# Patient Record
Sex: Female | Born: 1980 | Race: White | Hispanic: No | Marital: Single | Smoking: Never smoker
Health system: Southern US, Community
[De-identification: ages and names within clinical notes are randomized; demographics above are authoritative.]

## PROBLEM LIST (undated history)

## (undated) DIAGNOSIS — M8580 Other specified disorders of bone density and structure, unspecified site: Secondary | ICD-10-CM

## (undated) DIAGNOSIS — F419 Anxiety disorder, unspecified: Secondary | ICD-10-CM

## (undated) DIAGNOSIS — G629 Polyneuropathy, unspecified: Secondary | ICD-10-CM

## (undated) DIAGNOSIS — C50919 Malignant neoplasm of unspecified site of unspecified female breast: Secondary | ICD-10-CM

## (undated) HISTORY — DX: Malignant neoplasm of unspecified site of unspecified female breast: C50.919

## (undated) HISTORY — PX: ABDOMINAL HYSTERECTOMY: SHX81

## (undated) HISTORY — PX: MASTECTOMY: SHX3

## (undated) HISTORY — PX: BREAST SURGERY: SHX581

---

## 2002-11-02 ENCOUNTER — Encounter: Payer: Self-pay | Admitting: Family Medicine

## 2002-11-02 ENCOUNTER — Ambulatory Visit (HOSPITAL_COMMUNITY): Admission: RE | Admit: 2002-11-02 | Discharge: 2002-11-02 | Payer: Self-pay | Admitting: Family Medicine

## 2002-11-03 ENCOUNTER — Ambulatory Visit (HOSPITAL_COMMUNITY): Admission: RE | Admit: 2002-11-03 | Discharge: 2002-11-03 | Payer: Self-pay | Admitting: Family Medicine

## 2002-11-03 ENCOUNTER — Encounter: Payer: Self-pay | Admitting: Family Medicine

## 2004-11-09 ENCOUNTER — Encounter: Admission: RE | Admit: 2004-11-09 | Discharge: 2004-11-09 | Payer: Self-pay | Admitting: Hematology and Oncology

## 2006-11-08 ENCOUNTER — Encounter: Admission: RE | Admit: 2006-11-08 | Discharge: 2006-11-08 | Payer: Self-pay | Admitting: Unknown Physician Specialty

## 2006-12-06 ENCOUNTER — Encounter: Admission: RE | Admit: 2006-12-06 | Discharge: 2006-12-06 | Payer: Self-pay | Admitting: General Surgery

## 2007-07-16 ENCOUNTER — Ambulatory Visit (HOSPITAL_COMMUNITY): Admission: RE | Admit: 2007-07-16 | Discharge: 2007-07-16 | Payer: Self-pay | Admitting: General Surgery

## 2007-11-26 ENCOUNTER — Encounter: Admission: RE | Admit: 2007-11-26 | Discharge: 2007-11-26 | Payer: Self-pay | Admitting: Unknown Physician Specialty

## 2008-01-16 ENCOUNTER — Encounter: Admission: RE | Admit: 2008-01-16 | Discharge: 2008-01-16 | Payer: Self-pay | Admitting: Unknown Physician Specialty

## 2009-01-28 ENCOUNTER — Encounter: Admission: RE | Admit: 2009-01-28 | Discharge: 2009-01-28 | Payer: Self-pay | Admitting: Unknown Physician Specialty

## 2009-01-28 ENCOUNTER — Encounter: Admission: RE | Admit: 2009-01-28 | Discharge: 2009-01-28 | Payer: Self-pay | Admitting: General Surgery

## 2010-07-16 ENCOUNTER — Encounter: Payer: Self-pay | Admitting: Unknown Physician Specialty

## 2011-06-26 DIAGNOSIS — C50919 Malignant neoplasm of unspecified site of unspecified female breast: Secondary | ICD-10-CM

## 2011-06-26 HISTORY — DX: Malignant neoplasm of unspecified site of unspecified female breast: C50.919

## 2011-09-18 ENCOUNTER — Other Ambulatory Visit: Payer: Self-pay | Admitting: Internal Medicine

## 2011-09-18 DIAGNOSIS — R921 Mammographic calcification found on diagnostic imaging of breast: Secondary | ICD-10-CM

## 2011-09-21 ENCOUNTER — Ambulatory Visit
Admission: RE | Admit: 2011-09-21 | Discharge: 2011-09-21 | Disposition: A | Discharge status: Home / Self Care | Payer: PRIVATE HEALTH INSURANCE | Source: Ambulatory Visit | Attending: Internal Medicine | Admitting: Internal Medicine

## 2011-09-21 ENCOUNTER — Other Ambulatory Visit: Payer: Self-pay | Admitting: Internal Medicine

## 2011-09-21 DIAGNOSIS — R921 Mammographic calcification found on diagnostic imaging of breast: Secondary | ICD-10-CM

## 2011-11-20 ENCOUNTER — Encounter: Payer: PRIVATE HEALTH INSURANCE | Admitting: Hematology and Oncology

## 2011-11-20 DIAGNOSIS — D0590 Unspecified type of carcinoma in situ of unspecified breast: Secondary | ICD-10-CM

## 2011-11-20 DIAGNOSIS — Z1501 Genetic susceptibility to malignant neoplasm of breast: Secondary | ICD-10-CM

## 2011-11-20 DIAGNOSIS — I824Z9 Acute embolism and thrombosis of unspecified deep veins of unspecified distal lower extremity: Secondary | ICD-10-CM

## 2012-03-10 ENCOUNTER — Telehealth: Payer: Self-pay | Admitting: *Deleted

## 2012-03-10 NOTE — Telephone Encounter (Signed)
Confirmed 05/05/12 genetic appt w/ pt.

## 2012-05-05 ENCOUNTER — Encounter: Payer: Self-pay | Admitting: Genetic Counselor

## 2012-05-05 ENCOUNTER — Ambulatory Visit (HOSPITAL_BASED_OUTPATIENT_CLINIC_OR_DEPARTMENT_OTHER): Payer: PRIVATE HEALTH INSURANCE | Admitting: Genetic Counselor

## 2012-05-05 ENCOUNTER — Other Ambulatory Visit: Payer: PRIVATE HEALTH INSURANCE | Admitting: Lab

## 2012-05-05 DIAGNOSIS — C50919 Malignant neoplasm of unspecified site of unspecified female breast: Secondary | ICD-10-CM

## 2012-05-05 DIAGNOSIS — IMO0002 Reserved for concepts with insufficient information to code with codable children: Secondary | ICD-10-CM

## 2012-05-05 DIAGNOSIS — Z1501 Genetic susceptibility to malignant neoplasm of breast: Secondary | ICD-10-CM

## 2012-05-05 DIAGNOSIS — Z1509 Genetic susceptibility to other malignant neoplasm: Secondary | ICD-10-CM

## 2012-05-05 NOTE — Progress Notes (Signed)
Dr.  Isabel Caprice requested a consultation for genetic counseling and risk assessment for Carly [name], a 31 y.o. female, for discussion of her personal history of breast cancer and BRCA1 mutation. She presents to clinic today with her daughter Carly [name] to discuss the possibility of a genetic predisposition to cancer, and to further clarify her risks, as well as her family members' risks for cancer.   HISTORY OF PRESENT ILLNESS: In 2013, at the age of 67, Carly [name] was diagnosed with breast cancer. This was treated with double mastectomy and reconstruction.    Past Medical History  Diagnosis Date  . Breast cancer 2013    BRCA1 positive    History reviewed. No pertinent past surgical history.  History  Substance Use Topics  . Smoking status: Never Smoker   . Smokeless tobacco: Not on file  . Alcohol Use: No    REPRODUCTIVE HISTORY AND PERSONAL RISK ASSESSMENT FACTORS: Menarche was at age 13.   Premenopausal Uterus Intact: Yes, but is planning a TAH-BSO in May 2014. Ovaries Intact: Yes, but is planning a TAH-BSO in May 2014. G1P1A0 , first live birth at age 51  She has not previously undergone treatment for infertility.   OCP use for 2 years and depoprovera for 14 years   She has not used HRT in the past.    FAMILY HISTORY:  We obtained a detailed, 4-generation family history.  Significant diagnoses are listed below: Family History  Problem Relation Age of Onset  . Breast cancer Mother 62  . BRCA 1/2 Mother   . Prostate cancer Father     metastatic  . Breast cancer Sister 54    BRCA1 mutation  . Breast cancer Maternal Aunt     dx in her 53s; BRCA1 mutation  . Heart attack Maternal Grandfather   . Ovarian cancer Paternal Grandmother     dx in her 62s  . Heart attack Paternal Grandfather   . Healthy Sister   . BRCA 1/2 Maternal Aunt   . Healthy Maternal Aunt     negative for a BRCA mutation  . Healthy Maternal Aunt     negative for a BRCA mutation  The  patient was diagnosed with a BRCA1 mutation through [facility]. She was diagnosed with breast cancer in 2013 at age 19. She has two full sisters and a paternal half brother. One sister has tested positive for a BRCA mutation and was diagnosed with breast cancer in 2001 at at the age of 76. The other sister did not have the mutation. The patient's mother also has the BRCA1 mutation and was diagnosed with breast cancer at age 63. The patient's mother has four sisters. One sister had breast cancer and a BRCA mutation, and a second sister tested positive for a BRCA mutation, but has not developed cancer. The remaining two sisters tested negative for the BRCA mutation. The patient's maternal grandmother is 63 and has not had cancer, and her grandfather died of a heart attack at age 26. The patient's father died of metastatic prostate cancer at age 91. He was an only child. His mother died of ovarian cancer in her 82s, and his father died of a heart attack at 19. There is no other report of cancer on either side of the family.   Patient's maternal ancestors are of unknown descent, and paternal ancestors are of unknown descent. There is no reported Ashkenazi Jewish ancestry. There is no known consanguinity.   GENETIC COUNSELING RISK ASSESSMENT,  DISCUSSION, AND SUGGESTED FOLLOW UP:  We reviewed the natural history and genetic etiology of sporadic, familial and hereditary cancer syndromes. About 5-10% of breast cancer is hereditary. Of this, about 85% is the result of a BRCA1 or BRCA2 mutation. We reviewed the red flags of hereditary cancer syndromes and the dominant inheritance patterns. We reviewed that the family has a known BRCA1 mutation, and therefore her daughter is at 50% risk for testing positive for this mutation. We reviewed the NCCN guidelines for screening women with BRCA mutations for breast cancer including annual mastectomy, possible MRIs, and twice yearly clinical breast exams. We discussed risk  reducing mastectomy and oophorectomy. Currently, the patient's daughter is 82. Therefore, we would defer her daughter's testing until she was 18. We reviewed the AAP and ACMG guidelines for testing minors for adult onset conditions. At this time, we would not change the clinical management of the patient's daughter. However, if her daughter feels strongly about testing we could refer her for counseling, and once approved by a counselor, proceed with testing.   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 If a mutation indicating HBOCS is detected in this case, the [employer] recommendations would include increased cancer surveillance and possible prophylactic surgery. If a mutation is detected, the patient will be referred back to the referring provider and to any additional appropriate care providers to discuss the relevant options.   If a mutation is not found in the patient's daughter, this will decrease the likelihood of the development of breast cancer in her children to that of the population risk for their age and gender. Cancer surveillance options would be discussed for the patient according to the appropriate standard National Comprehensive Cancer Network and American Cancer Society guidelines, with consideration of their personal and family history risk factors. In this case, the patient's children will be referred back to their care providers for discussions of management.   After considering the risks, benefits, and limitations, the patient indicated coming back at a later date if her daughter decides to proceed with testing. At this time, the patient's daughter does  not want to be tested and stated that she did not want to do so because she could not do anything to change her care at this time.  The patient was seen for a total of 60 minutes, greater than 50% of which was spent face-to-face counseling.  This plan is being carried out per Dr. Isabel Caprice recommendations.  This note will also be sent to the referring provider via the electronic medical record. The patient will be supplied with a summary of this genetic counseling discussion as well as educational information on the discussed hereditary cancer syndromes following the conclusion of their visit.   Patient was discussed with Dr. Drue Second.   _______________________________________________________________________ For Office Staff:  Number of people involved in session: 8 Was an Intern/ student involved with case: yes

## 2012-05-06 DIAGNOSIS — G43909 Migraine, unspecified, not intractable, without status migrainosus: Secondary | ICD-10-CM | POA: Insufficient documentation

## 2012-05-06 DIAGNOSIS — Z1509 Genetic susceptibility to other malignant neoplasm: Secondary | ICD-10-CM | POA: Insufficient documentation

## 2012-05-07 ENCOUNTER — Encounter: Payer: Self-pay | Admitting: Genetic Counselor

## 2012-09-29 DIAGNOSIS — L905 Scar conditions and fibrosis of skin: Secondary | ICD-10-CM | POA: Insufficient documentation

## 2015-07-12 DIAGNOSIS — C50919 Malignant neoplasm of unspecified site of unspecified female breast: Secondary | ICD-10-CM | POA: Insufficient documentation

## 2015-08-10 DIAGNOSIS — R161 Splenomegaly, not elsewhere classified: Secondary | ICD-10-CM | POA: Insufficient documentation

## 2015-08-26 DIAGNOSIS — Z95828 Presence of other vascular implants and grafts: Secondary | ICD-10-CM | POA: Insufficient documentation

## 2015-09-13 DIAGNOSIS — M79601 Pain in right arm: Secondary | ICD-10-CM | POA: Insufficient documentation

## 2015-10-25 DIAGNOSIS — G620 Drug-induced polyneuropathy: Secondary | ICD-10-CM | POA: Insufficient documentation

## 2015-10-25 DIAGNOSIS — T451X5A Adverse effect of antineoplastic and immunosuppressive drugs, initial encounter: Secondary | ICD-10-CM

## 2015-10-25 DIAGNOSIS — D701 Agranulocytosis secondary to cancer chemotherapy: Secondary | ICD-10-CM | POA: Insufficient documentation

## 2015-12-12 DIAGNOSIS — Z90722 Acquired absence of ovaries, bilateral: Secondary | ICD-10-CM | POA: Insufficient documentation

## 2015-12-12 DIAGNOSIS — IMO0002 Reserved for concepts with insufficient information to code with codable children: Secondary | ICD-10-CM | POA: Insufficient documentation

## 2016-01-26 ENCOUNTER — Encounter (HOSPITAL_COMMUNITY): Payer: Medicaid Other | Attending: Hematology & Oncology | Admitting: Hematology & Oncology

## 2016-01-26 ENCOUNTER — Encounter (HOSPITAL_COMMUNITY): Payer: Self-pay | Admitting: Hematology & Oncology

## 2016-01-26 ENCOUNTER — Encounter (HOSPITAL_COMMUNITY): Payer: Medicaid Other

## 2016-01-26 VITALS — BP 140/82 | HR 86 | Temp 98.1°F | Resp 16 | Ht 67.0 in | Wt 281.2 lb

## 2016-01-26 DIAGNOSIS — G620 Drug-induced polyneuropathy: Secondary | ICD-10-CM | POA: Diagnosis not present

## 2016-01-26 DIAGNOSIS — D0511 Intraductal carcinoma in situ of right breast: Secondary | ICD-10-CM

## 2016-01-26 DIAGNOSIS — Z780 Asymptomatic menopausal state: Secondary | ICD-10-CM

## 2016-01-26 DIAGNOSIS — Z1509 Genetic susceptibility to other malignant neoplasm: Secondary | ICD-10-CM

## 2016-01-26 DIAGNOSIS — Z803 Family history of malignant neoplasm of breast: Secondary | ICD-10-CM

## 2016-01-26 DIAGNOSIS — I890 Lymphedema, not elsewhere classified: Secondary | ICD-10-CM

## 2016-01-26 DIAGNOSIS — C50911 Malignant neoplasm of unspecified site of right female breast: Secondary | ICD-10-CM

## 2016-01-26 DIAGNOSIS — Z8041 Family history of malignant neoplasm of ovary: Secondary | ICD-10-CM | POA: Diagnosis not present

## 2016-01-26 DIAGNOSIS — Z1501 Genetic susceptibility to malignant neoplasm of breast: Secondary | ICD-10-CM

## 2016-01-26 DIAGNOSIS — Z86718 Personal history of other venous thrombosis and embolism: Secondary | ICD-10-CM

## 2016-01-26 DIAGNOSIS — T451X5A Adverse effect of antineoplastic and immunosuppressive drugs, initial encounter: Secondary | ICD-10-CM

## 2016-01-26 MED ORDER — GABAPENTIN 300 MG PO CAPS: 600.0000 mg | ORAL_CAPSULE | Freq: Three times a day (TID) | ORAL | 3 refills | Status: DC

## 2016-01-26 MED ORDER — HYDROCODONE-ACETAMINOPHEN 10-325 MG PO TABS: 1.0000 | ORAL_TABLET | ORAL | 0 refills | Status: DC | PRN

## 2016-01-26 NOTE — Patient Instructions (Signed)
[place] at [facility] Discharge Instructions  RECOMMENDATIONS MADE BY THE CONSULTANT AND ANY TEST RESULTS WILL BE SENT TO YOUR REFERRING PHYSICIAN.  Exam done and seen today by Dr. Gustavus Bryant today and in 6 weeks. Return to see the doctor in 6 weeks Pain script given today. Please call the clinic if you have any questions or concerns  Thank you for choosing [place] at [facility] to provide your oncology and hematology care.  To afford each patient quality time with our provider, please arrive at least 15 minutes before your scheduled appointment time.   Beginning January 23rd 2017 lab work for the [facility] will be done in the  Main lab at [facility] on 1st floor. If you have a lab appointment with the [place] please come in thru the  Main Entrance and check in at the main information desk  You need to re-schedule your appointment should you arrive 10 or more minutes late.  We strive to give you quality time with our providers, and arriving late affects you and other patients whose appointments are after yours.  Also, if you no show three or more times for appointments you may be dismissed from the clinic at the providers discretion.     Again, thank you for choosing [facility]  Our hope is that these requests will decrease the amount of time that you wait before being seen by our physicians.       _____________________________________________________________  Should you have questions after your visit to [facility], please contact our office at [phone] between the hours of 8:30 a.m. and 4:30 p.m.  Voicemails left after 4:30 p.m. will not be returned until the following business day.  For prescription refill requests, have your pharmacy contact our office.         Resources For Cancer Patients and their Caregivers ? American Cancer Society: Can assist with transportation, wigs, general  needs, runs Look Good Feel Better.        508-189-1591 ? Cancer Care: Provides financial assistance, online support groups, medication/co-pay assistance.  1-800-813-HOPE 320-240-9830) ? [place] Assists [place] cancer patients and their families through emotional , educational and financial support.  305-656-1886 ? [place] DSS Where to apply for food stamps, Medicaid and utility assistance. 727-604-8451 ? [facility]: Transportation to medical appointments. 743-631-7583 ? Social Security Administration: May apply for disability if have a Stage IV cancer. 9192934861 (269)307-7541 ? [employer], Disability and Transit Services: Assists with nutrition, care and transit needs. [place] Support Programs: @10RELATIVEDAYS @ > Cancer Support Group  2nd Tuesday of the month 1pm-2pm, Journey Room  > Creative Journey  3rd Tuesday of the month 1130am-1pm, Journey Room  > Look Good Feel Better  1st Wednesday of the month 10am-12 noon, Journey Room (Call [place] to register [phone])

## 2016-01-26 NOTE — Progress Notes (Signed)
[place]  CONSULT NOTE  No care team member to display  CHIEF COMPLAINTS/PURPOSE OF CONSULTATION:     Recurrent breast cancer ([place])   05/06/2015 Initial Biopsy    Incisional biopsy with Dr. Maryclare Labrador of palpable mass, lower outer quadrant of reconstructed R breast close to nipple ER+ 97%, PR+ 70 (weak) HER 2 - by [facility]      07/06/2015 Surgery    Definitive lumpectomy and LN dissection performed revealing a 3.1 cm primary 1/14 positive LN with a macro metastases, satellite nodules adjacent to the primary, LVI-, however she had skin invasion along with satellite nodules, T4b N1a, Stage IIIB, grade IIII,      07/07/2015 Imaging    Per records negative CT C/A/P and bone scan      08/15/2015 - 11/22/2015 Chemotherapy    AC x 4, Taxol X 4 both given dose dense                  HISTORY OF PRESENTING ILLNESS:  Carly [name] 35 y.o. 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 She noted a palpable abnormality in the reconstructed breast which was biopsied an revealed recurrence. ER/PR+ and HER 2 -. 1/14 LN positive for disease. Per report there were satellite nodules adjacent to the primary at the time of surgical excision. She notes that she had her implant removed and replaced.   Carly [name] is unaccompanied.  2013 double mastectomy without nipple sparing with Dr. Lizbeth Bark? in [place]. Dr. Maryclare Labrador began reconstruction in [place]. April 2014 she had implants put in. DCIS Stage I no lymph nodes positive. October 2016 she found a knot in the breast scar. Her PCP felt it and got an [place] to make sure it was okay. She returned the next week for a biopsy however the radiologist did not want to biopsy because it was so close to the implant. She had it biopsied with Dr. Lizbeth Bark. She was found to have invasive adenocarcinoma Stage III with  positive LN. She had the implant removed and a new one put in. She began chemotherapy in February and finished in May. She began radiation in June and is scheduled to finish radiation treatment tomorrow.   She was told by Dr. Tressie Stalker that she would be taking Tamoxifen for 10 years. She was given a prescription for Tamoxifen with several refills. She did not start taking this because she wanted to wait to be seen at [facility] first.   Her ovaries and tubes were removed in November 2016. She still has her uterus. She was found to be BRCA+ when she was 35 yo. She has not received genetics testing since 2001.  She has been doing well with all of this. She is happy to be done with chemotherapy and nearly being done with radiation treatment. She has been using Aquafor, clear aloe, and sensitive Dove on her radiated breast. States, "The fold is the worst".   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 In the mornings her feet hurt so bad that she doesn't want to get out of the bed to stand on them. This improves through the day after taking neurontin and Vicodin. When she began experiencing the neuropathy she went into the clinic crying and wanted her feet cut off, it hurt so badly.  Her neurontin was increased to 600 daily. She is okay on this dosage along with the Vicodin.   She reports having a large water blister. She stands up very slowly from her chair to avoid discomfort in her feet. States, "Tennis shoes won't happen". She wears flip flops and notes she even has to wear them to the bathroom at night because she can't stand not having something on the bottom of her feet.   She does note trouble sleeping and will sometimes take her medication in the night to be able to sleep. After radiation treatment, she often takes a nap.  She takes Protonix though not everyday for intermittent acid reflux. The acid reflux happens depending  on what she eats.  She reports fatigue. "I just stay tired".   She had to try many types of anti-nausea medication because her nausea was terrible. She was to the point that she'd rather just throw up and get it over with. She finally found an anti-nausea that worked around her last round of treatment.   She had a 2-D Echo done before she started chemotherapy. She was told she would have another performed halfway through treatment but it never got ordered. She is concerned whether she may have clots. She notes having "webbing" in her right arm as well as lymphedema. She sees a lymphedema specialist at [facility] who will be ordering her a lymphedema machine. She notes her lymphedema is better some days than others. She knows all the exercises to do at home, and is very aware of these having seen her mother and sister doing these. She understands how important it is to keep an eye on lymphedema symptoms.   She had a CT, bone scan, and MRIs done at [facility].  States she has not had a period in 18 years.  She is interested in returning to work in the future. She was terminated from her job without being notified and no longer has health insurance benefits.   The patient is here to establish and continue care of breast cancer.   MEDICAL HISTORY:  Past Medical History:  Diagnosis Date  . Breast cancer ([place]) 2013   BRCA1 positive    SURGICAL HISTORY: Past Surgical History:  Procedure Laterality Date  . ABDOMINAL HYSTERECTOMY    . CESAREAN SECTION    . MASTECTOMY      SOCIAL HISTORY: Social History   Social History  . Marital status: Single    Spouse name: N/A  . Number of children: N/A  . Years of education: N/A   Occupational History  . Not on file.   Social History Main Topics  . Smoking status: Never Smoker  . Smokeless tobacco: Never Used  . Alcohol use No  . Drug use: No  . Sexual activity: Not on file   Other Topics Concern  . Not on file   Social History  Narrative  . 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 Breast cancer Mother 5  . BRCA 1/2 Mother   . Prostate cancer Father     metastatic  . Breast cancer Sister 8    BRCA1 mutation  . Breast cancer Maternal Aunt     dx in her 7s; BRCA1 mutation  . Heart attack Maternal Grandfather   .  Ovarian cancer Paternal Grandmother     dx in her 36s  . Heart attack Paternal Grandfather   . Healthy Sister   . BRCA 1/2 Maternal Aunt   . Healthy Maternal Aunt     negative for a BRCA mutation  . Healthy Maternal Aunt     negative for a BRCA mutation   Mother still living with breast cancer BRCA+ diagnosed at 77 yo Father deceased in 09-17-09 at 30 yo with prostate cancer mets to the bone. Her father was an only child and his father was adopted. 2 sisters and paternal brother 1 sister diagnosed with breast cancer at 24 yo, had a double mastectomy and been in remission for 17 years  ALLERGIES:  is allergic to oxycodone-acetaminophen.  MEDICATIONS:  Current Outpatient Prescriptions  Medication Sig Dispense Refill  . ALPRAZolam (XANAX) 0.25 MG tablet Take one tablet three times a day as needed.    . gabapentin (NEURONTIN) 300 MG capsule Take by mouth.    Marland Kitchen HYDROcodone-acetaminophen (NORCO) 10-325 MG tablet Take by mouth.    . pantoprazole (PROTONIX) 40 MG tablet Take by mouth.    . cyclobenzaprine (FLEXERIL) 5 MG tablet Take by mouth.    . docusate sodium (COLACE) 100 MG capsule Take by mouth.    Marland Kitchen ibuprofen (ADVIL,MOTRIN) 200 MG tablet Take by mouth.    . lidocaine-prilocaine (EMLA) cream Apply topically.    . ondansetron (ZOFRAN) 4 MG tablet TAKE ONE TABLET BY MOUTH EVERY 6 HOURS AS NEEDED FOR NAUSEA    . prochlorperazine (COMPAZINE) 10 MG tablet Take by mouth.    . promethazine  (PHENERGAN) 12.5 MG tablet Take by mouth.    . tamoxifen (NOLVADEX) 20 MG tablet Take 20 mg by mouth daily.  2  . triamterene-hydrochlorothiazide (MAXZIDE-25) 37.5-25 MG tablet Take one tab daily as needed for swelling.     No current facility-administered medications for this visit.     Review of Systems  Constitutional: Positive for malaise/fatigue.  HENT: Negative.   Eyes: Negative.   Respiratory: Negative.   Cardiovascular: Negative.   Gastrointestinal: Positive for heartburn.       Intermittent acid reflux managed with Protonix  Genitourinary: Negative.   Musculoskeletal: Negative.   Skin: Negative.        Radiation related skin changes to breast  Neurological: Positive for sensory change and weakness.  Endo/Heme/Allergies: Negative.   Psychiatric/Behavioral: Negative.   All other systems reviewed and are negative. 14 point ROS was done and is otherwise as detailed above or in HPI   PHYSICAL EXAMINATION: ECOG PERFORMANCE STATUS: 1 - Symptomatic but completely ambulatory  Vitals:   01/26/16 1147  BP: 140/82  Pulse: 86  Resp: 16  Temp: 98.1 F (36.7 C)   Filed Weights   01/26/16 1147  Weight: 281 lb 3.2 oz (127.6 kg)    Physical Exam  Constitutional: She is oriented to person, place, and time and well-developed, well-nourished, and in no distress.  HENT:  Head: Normocephalic and atraumatic.  Nose: Nose normal.  Mouth/Throat: Oropharynx is clear and moist. No oropharyngeal exudate.  Eyes: Conjunctivae and EOM are normal. Pupils are equal, round, and reactive to light. Right eye exhibits no discharge. Left eye exhibits no discharge. No scleral icterus.  Neck: Normal range of motion. Neck supple. No tracheal deviation present. No thyromegaly present.  Cardiovascular: Normal rate, regular rhythm and normal heart sounds.  Exam reveals no gallop and no friction rub.   No murmur heard. Pulmonary/Chest: Effort normal and breath sounds  normal. She has no wheezes. She has  no rales.    Abdominal: Soft. Bowel sounds are normal. She exhibits no distension and no mass. There is no tenderness. There is no rebound and no guarding.  Musculoskeletal: Normal range of motion. She exhibits edema.  Right arm lymphedema present.   Lymphadenopathy:    She has no cervical adenopathy.  Neurological: She is alert and oriented to person, place, and time. She has normal reflexes. No cranial nerve deficit. Gait normal. Coordination normal.  Skin: Skin is warm and dry. No rash noted.  Psychiatric: Mood, memory, affect and judgment normal.  Nursing note and vitals reviewed.   LABORATORY DATA:  I have reviewed the data as listed No results found for: WBC, HGB, HCT, MCV, PLT CMP  No results found for: NA, K, CL, CO2, GLUCOSE, BUN, CREATININE, CALCIUM, PROT, ALBUMIN, AST, ALT, ALKPHOS, BILITOT, GFRNONAA, GFRAA   RADIOGRAPHIC STUDIES: I have personally reviewed the radiological images as listed and agreed with the findings in the report. No results found.  ASSESSMENT & PLAN: Stage I DCIS BRCA+ diagnosed in 2013; double mastectomy in [place], reconstruction with Dr. Maryclare Labrador Stage III Invasive adenocarcinoma diagnosed in 2016; chemotherapy with Dr. Tressie Stalker and radiation with Dr. Pablo Ledger ER+ PR+ HER 2 - Last genetics testing done in 2001 Bilateral mastectomy Hysterectomy/oophorectomy HX DVT BRCA1 positivity Chemotherapy with AC followed by T given in a dose dense fashion Adjuvant XRT Chemotherapy induced neuropathy  Last seen by Dr. Tressie Stalker on 12/12/15.  The patient is here to establish and continue care of breast cancer. She was given a NCCN guidelines information booklet about breast cancer. I reviewed this booklet with the patient at length. I spoke with the patient about aromatase inhibitors and long term follow up. She is an appropriate candidate for AI therapy and I would recommend this over tamoxifen for her.   Hormone levels will be checked after our visit  today. She had an oophorectomy years back.   I spoke with genetics and based on the patient's history it is unlikely that insurance will cover additional testing. I spoke with the patient about having genetics testing performed out of pocket for about $250. The patient will let us know if she would like a future meeting with genetics. It is highly unlikely that she would have more than one mutation.   She is scheduled to complete radiation treatment tomorrow, 01/27/16.  I have ordered a 2D Echo at the patient's request. I have set her up for a DEXA as she has been post menopausal for some time.   I will make a referral to survivorship.  The patient will sign a release of records for scans and operative reports, pathology performed at [facility], including CT imaging, MRI imaging.  She will return in a few weeks to review DEXA, echo and to start appropriate endocrine therapy.   Orders Placed This Encounter  Procedures  . DG Bone Density    JH/AMY       Standing Status:   Future    Number of Occurrences:   1    Standing Expiration Date:   01/25/2017    Order Specific Question:   Reason for Exam (SYMPTOM  OR DIAGNOSIS REQUIRED)    Answer:   high risk medication    Order Specific Question:   Is the patient pregnant?    Answer:   No    Order Specific Question:   Preferred imaging location?    Answer:   [facility]  .  Follicle stimulating hormone    Standing Status:   Future    Number of Occurrences:   1    Standing Expiration Date:   01/25/2017  . Estradiol    Standing Status:   Future    Number of Occurrences:   1    Standing Expiration Date:   01/25/2017  . CBC with Differential    Standing Status:   Future    Standing Expiration Date:   01/25/2017  . Comprehensive metabolic panel    Standing Status:   Future    Standing Expiration Date:   01/25/2017    All questions were answered. The patient knows to call the clinic with any problems, questions or concerns.  This  document serves as a record of services personally performed by Ancil Linsey, MD. It was created on her behalf by Arlyce Harman, a trained medical scribe. The creation of this record is based on the scribe's personal observations and the provider's statements to them. This document has been checked and approved by the attending provider.  I have reviewed the above documentation for accuracy and completeness, and I agree with the above.  This note was electronically signed.    Molli Hazard, MD  01/26/2016 11:58 AM

## 2016-01-27 LAB — ESTRADIOL: Estradiol: 10.1 pg/mL

## 2016-01-27 LAB — FOLLICLE STIMULATING HORMONE: FSH: 67.5 m[IU]/mL

## 2016-02-06 ENCOUNTER — Telehealth (HOSPITAL_COMMUNITY): Payer: Self-pay | Admitting: *Deleted

## 2016-02-06 ENCOUNTER — Encounter (HOSPITAL_COMMUNITY): Payer: Self-pay | Admitting: Radiology

## 2016-02-06 ENCOUNTER — Other Ambulatory Visit (HOSPITAL_COMMUNITY): Payer: Self-pay | Admitting: Oncology

## 2016-02-06 ENCOUNTER — Ambulatory Visit (HOSPITAL_COMMUNITY)
Admission: RE | Admit: 2016-02-06 | Discharge: 2016-02-06 | Disposition: A | Discharge status: Home / Self Care | Payer: Medicaid Other | Source: Ambulatory Visit | Attending: Hematology & Oncology | Admitting: Hematology & Oncology

## 2016-02-06 ENCOUNTER — Other Ambulatory Visit (HOSPITAL_COMMUNITY): Payer: Self-pay | Admitting: Hematology & Oncology

## 2016-02-06 DIAGNOSIS — Z79899 Other long term (current) drug therapy: Secondary | ICD-10-CM | POA: Insufficient documentation

## 2016-02-06 DIAGNOSIS — C50911 Malignant neoplasm of unspecified site of right female breast: Secondary | ICD-10-CM

## 2016-02-06 DIAGNOSIS — Z09 Encounter for follow-up examination after completed treatment for conditions other than malignant neoplasm: Secondary | ICD-10-CM | POA: Diagnosis present

## 2016-02-06 DIAGNOSIS — M8589 Other specified disorders of bone density and structure, multiple sites: Secondary | ICD-10-CM | POA: Insufficient documentation

## 2016-02-06 LAB — ECHOCARDIOGRAM COMPLETE
AVLVOTPG: 3 mmHg
CHL CUP DOP CALC LVOT VTI: 19 cm
CHL CUP MV DEC (S): 182
CHL CUP RV SYS PRESS: 18 mmHg
CHL CUP TV REG PEAK VELOCITY: 193 cm/s
E decel time: 182 msec
EERAT: 6.75
FS: 30 % (ref 28–44)
IVS/LV PW RATIO, ED: 1.05
LA vol: 50.4 mL
LADIAMINDEX: 1.67 cm/m2
LASIZE: 42 mm
LAVOLA4C: 46.1 mL
LAVOLIN: 20 mL/m2
LEFT ATRIUM END SYS DIAM: 42 mm
LV E/e' medial: 6.75
LV TDI E'LATERAL: 11.2
LV TDI E'MEDIAL: 8.16
LV dias vol index: 28 mL/m2
LV dias vol: 70 mL (ref 46–106)
LV sys vol index: 11 mL/m2
LV sys vol: 27 mL (ref 14–42)
LVEEAVG: 6.75
LVELAT: 11.2 cm/s
LVOT area: 3.14 cm2
LVOT diameter: 20 mm
LVOT peak vel: 90 cm/s
LVOTSV: 60 mL
Lateral S' vel: 12.5 cm/s
MVPG: 2 mmHg
MVPKAVEL: 58.7 m/s
MVPKEVEL: 75.6 m/s
PW: 9.49 mm — AB (ref 0.6–1.1)
Simpson's disk: 62
Stroke v: 43 ml
TAPSE: 18.9 mm
TRMAXVEL: 193 cm/s

## 2016-02-06 MED ORDER — HYDROCODONE-ACETAMINOPHEN 10-325 MG PO TABS: 1.0000 | ORAL_TABLET | ORAL | 0 refills | Status: DC | PRN

## 2016-02-06 NOTE — Progress Notes (Signed)
*  PRELIMINARY RESULTS* Echocardiogram 2D Echocardiogram has been performed.  Samuel Germany 02/06/2016, 11:32 AM

## 2016-02-07 ENCOUNTER — Encounter (HOSPITAL_BASED_OUTPATIENT_CLINIC_OR_DEPARTMENT_OTHER): Payer: Medicaid Other | Admitting: Adult Health

## 2016-02-07 DIAGNOSIS — G622 Polyneuropathy due to other toxic agents: Secondary | ICD-10-CM | POA: Diagnosis not present

## 2016-02-07 DIAGNOSIS — Z1509 Genetic susceptibility to other malignant neoplasm: Secondary | ICD-10-CM

## 2016-02-07 DIAGNOSIS — Z1501 Genetic susceptibility to malignant neoplasm of breast: Secondary | ICD-10-CM

## 2016-02-07 DIAGNOSIS — C50911 Malignant neoplasm of unspecified site of right female breast: Secondary | ICD-10-CM | POA: Diagnosis not present

## 2016-02-07 NOTE — Progress Notes (Signed)
[place], [place] [address]   CLINIC:  Survivorship  REASON FOR VISIT:  Survivorship visit for breast cancer.   BRIEF ONCOLOGY HISTORY:    Recurrent breast cancer ([place])   07/12/2015 Initial Diagnosis    Recurrent breast cancer ([place])     02/06/2016 Imaging    Bone density- BMD as determined from [facility] L1-L4 is 1.021 g/cm2 with a T-Score of -1.3. This patient is considered osteopenic according to [facility]) criteria.      *Of note: Much of her oncologic history is not available for review prior to her visit today.  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 Based on Dr. Donald Pore initial note, She initially was diagnosed with breast cancer in 2013 and underwent bilateral mastectomies with reconstruction.  She was found to have new breast cancer diagnosis, Stage III invasive disease in 03/2015.  Ms. Iwata received her subsequent breast surgery in [place]. She then received her chemotherapy with Dr. Tressie Stalker and her radiation therapy with Dr. Libby Maw. Isidore Moos at [facility] in [place], [place].    INTERVAL HISTORY:  Ms. Cowman is about about 11 days out from completing adjuvant breast radiation therapy; her last day of radiation was 01/27/16 per patient.  She reported completing chemotherapy in 10/2015. She tells me she had 4 cycles of Adriamycin/Cytoxan, followed by 4 cycles of Taxol. 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  She takes gabapentin 600 mg TID and Vicodin to manage her pain.  Her pain is manageable on this regimen, but still excruciating at times.  She uses a stool softener daily; reports she has no problems with constipation.    Her fatigue is slowly improving since completing radiation.  She is looking forward to meeting with the physical therapist next Friday regarding her right arm lymphedema and decreased shoulder mobility.  I  mentioned that PT may also be able to give her strategies to help with the neuropathy as well.    Ms. Antony recently underwent DEXA scan and ECHO.  We are waiting to receive the results of her CT, bone scan, and MRIs which were done at a [facility] facility in [place].  She has not started taking any anti-estrogen therapy yet, as she is waiting until her next visit with Dr. Whitney Muse in 02/2016 to review her hormone levels and other test results; they will have further treatment plan discussions at that time.   She tells me that she has a very strong support system in her friends and family.  Both her mother and sister have had breast cancer and are BRCA1 (+).  She has 3 girlfriends who are very supportive of her; she calls them "The [employer]"; after she finished radiation, she and her friends went to the beach together to celebrate.      ADDITIONAL REVIEW OF SYSTEMS:  Review of Systems  Constitutional: Positive for malaise/fatigue. Negative for fever.  HENT: Negative.   Eyes: Negative.   Gastrointestinal: Negative.  Negative for constipation.  Genitourinary: Negative.   Skin:       Healing, patchy skin dryness to (R) breast s/p radiation; no open wounds; applying Aquaphor  Neurological: Positive for tingling.       (+) bilateral peripheral neuropathy  Endo/Heme/Allergies: Negative.   Psychiatric/Behavioral: Negative.      PAST MEDICAL & SURGICAL HISTORY:  Past Medical History:  Diagnosis Date  . Breast cancer ([place])  2013   BRCA1 positive   Past Surgical History:  Procedure Laterality Date  . ABDOMINAL HYSTERECTOMY    . CESAREAN SECTION    . MASTECTOMY       SOCIAL HISTORY: Ms. Loudon is single and lives in [place], [place].  She has 1 daughter, Donavan Foil, who is 30 years old.  She currently is not working, but previously worked as a Quarry manager, Art therapist, and [employer] at [employer].  She tells me her position was terminated after her FMLA completed during her treatment for cancer.    CURRENT  MEDICATIONS:  Current Outpatient Prescriptions on File Prior to Visit  Medication Sig Dispense Refill  . ALPRAZolam (XANAX) 0.25 MG tablet Take one tablet three times a day as needed.    . cyclobenzaprine (FLEXERIL) 5 MG tablet Take by mouth.    . docusate sodium (COLACE) 100 MG capsule Take by mouth.    . gabapentin (NEURONTIN) 300 MG capsule Take 2 capsules (600 mg total) by mouth 3 (three) times daily. 180 capsule 3  . HYDROcodone-acetaminophen (NORCO) 10-325 MG tablet Take 1 tablet by mouth every 4 (four) hours as needed. 60 tablet 0  . ibuprofen (ADVIL,MOTRIN) 200 MG tablet Take by mouth.    . lidocaine-prilocaine (EMLA) cream Apply topically.    . ondansetron (ZOFRAN) 4 MG tablet TAKE ONE TABLET BY MOUTH EVERY 6 HOURS AS NEEDED FOR NAUSEA    . pantoprazole (PROTONIX) 40 MG tablet Take by mouth.    . prochlorperazine (COMPAZINE) 10 MG tablet Take by mouth.    . promethazine (PHENERGAN) 12.5 MG tablet Take by mouth.    . tamoxifen (NOLVADEX) 20 MG tablet Take 20 mg by mouth daily.  2  . triamterene-hydrochlorothiazide (MAXZIDE-25) 37.5-25 MG tablet Take one tab daily as needed for swelling.     No current facility-administered medications on file prior to visit.     ALLERGIES: Allergies  Allergen Reactions  . Oxycodone-Acetaminophen Hives    Nausea and Vomiting    PHYSICAL EXAM:  General: Female in no acute distress.  Unaccompanied today.    HEENT: Head is normocephalic.  Pupils equal and reactive to light. Conjunctivae clear without exudate.  Sclerae anicteric. Oral mucosa is pink and moist without lesions. Oropharynx is pink and moist without lesions. Lymph: No cervical, supraclavicular, infraclavicular lymphadenopathy noted on palpation.   Cardiovascular: Normal rate and rhythm Respiratory: Clear to auscultation bilaterally. Chest expansion symmetric without accessory muscle use. Breathing non-labored.   (R) breast: Healing radiation dermatitis; dry desquamation noted to  breast, supraclavicular region, and upper posterior right shoulder; also some expected hyperpigmentation; all progressing towards healing.    GU: Deferred.   GI: Soft, non-tender abdomen. Normoactive bowel sounds.  Neuro: No focal deficits. Steady gait.   Psych: Normal mood and affect for situation. Extremities: No edema.  Skin: Warm and dry.   LABORATORY DATA: None for this visit.   DIAGNOSTIC IMAGING:  None for this visit.    ASSESSMENT & PLAN:  Ms. Cieslik is a pleasant 35 y.o. female with history of Stage III right breast cancer; BRCA1 (+), treated with surgery, adjuvant chemo, and radiation therapy; completed treatment on 01/27/16.  Patient presents to survivorship clinic today for long-term survivorship visit and routine cancer surveillance.    1. Stage III right breast cancer:  Ms. Picazo is continuing to recover from definitive treatment for breast cancer.  She will follow-up at [facility] with her radiation oncologist in a few weeks.  She will see Dr. Whitney Muse in 02/2016 for continued surveillance  and and treatment planning.  I reviewed with her the guidelines for follow-up, which does not include routine CT scans. She understands the rationale for why we do not perform routine imaging for breast cancer survivors.  We also reviewed the difference between Tamoxifen and aromatase inhibitors. We discussed potential side effects of aromatase inhibitors, which will likely be the most appropriate choice for her given her TAH/BSO and likely now being post-menopausal.  Dr. Whitney Muse will further discuss these options with her at their next visit.   2. Peripheral neuropathy: This is her continued biggest concern today. Continue current pain medication regimen with gabapentin and hydrocodone/APAP.  Physical therapy may also be able to help her feel more steady on her feet as she continues to heal from her neuropathy. She understands that the neuropathy is likely related to the Taxol chemotherapy.  While  we are hopeful that she will recover full sensation to her hands and feet, we will see how she does as she continues to heal.    3. Physical activity/Healthy eating: Getting adequate physical activity and maintaining a healthy diet as a cancer survivor is important for overall wellness and reduces the risk of cancer recurrence. We discussed the [facility], which is a fitness program that is offered to cancer survivors free of charge.  We also reviewed "The Nutrition Rainbow" handout, as well as the American Cancer Society's booklet with recommendations for nutrition and physical activity.    4. Support services/Counseling: It is not uncommon for this period of the patient's cancer care trajectory to be one of many emotions and stressors.  Ms. Twichell was encouraged to take advantage of our support services programs and support groups to better cope in her new life as a cancer survivor after completing anti-cancer treatment. Today, I provided support through active listening, validation of concerns, and expressive supportive counseling.    Dispo:  -Return to cancer center to see Dr. Whitney Muse in 02/2016.    A total of 35 minutes was spent in face-to-face care of this patient, with greater than 50% of that time spent in counseling and care coordination.   Mike Craze, NP Survivorship Program [place] [phone]

## 2016-02-12 ENCOUNTER — Encounter (HOSPITAL_COMMUNITY): Payer: Self-pay | Admitting: Hematology & Oncology

## 2016-02-17 ENCOUNTER — Other Ambulatory Visit (HOSPITAL_COMMUNITY): Payer: Self-pay | Admitting: Oncology

## 2016-02-17 DIAGNOSIS — Z79899 Other long term (current) drug therapy: Secondary | ICD-10-CM

## 2016-02-17 DIAGNOSIS — C50911 Malignant neoplasm of unspecified site of right female breast: Secondary | ICD-10-CM

## 2016-02-17 MED ORDER — HYDROCODONE-ACETAMINOPHEN 10-325 MG PO TABS: 1.0000 | ORAL_TABLET | ORAL | 0 refills | Status: DC | PRN

## 2016-03-01 ENCOUNTER — Other Ambulatory Visit (HOSPITAL_COMMUNITY): Payer: Self-pay | Admitting: Oncology

## 2016-03-01 DIAGNOSIS — C50911 Malignant neoplasm of unspecified site of right female breast: Secondary | ICD-10-CM

## 2016-03-01 DIAGNOSIS — T451X5A Adverse effect of antineoplastic and immunosuppressive drugs, initial encounter: Secondary | ICD-10-CM

## 2016-03-01 DIAGNOSIS — Z79899 Other long term (current) drug therapy: Secondary | ICD-10-CM

## 2016-03-01 DIAGNOSIS — G620 Drug-induced polyneuropathy: Secondary | ICD-10-CM

## 2016-03-01 MED ORDER — HYDROCODONE-ACETAMINOPHEN 10-325 MG PO TABS: 1.0000 | ORAL_TABLET | ORAL | 0 refills | Status: DC | PRN

## 2016-03-08 ENCOUNTER — Encounter (HOSPITAL_COMMUNITY): Payer: Medicaid Other | Attending: Oncology

## 2016-03-08 ENCOUNTER — Encounter (HOSPITAL_COMMUNITY): Payer: Medicaid Other

## 2016-03-08 ENCOUNTER — Encounter (HOSPITAL_COMMUNITY): Payer: Self-pay

## 2016-03-08 ENCOUNTER — Other Ambulatory Visit (HOSPITAL_COMMUNITY): Payer: Self-pay | Admitting: Oncology

## 2016-03-08 VITALS — BP 134/80 | HR 83 | Temp 97.8°F | Resp 20

## 2016-03-08 DIAGNOSIS — C50911 Malignant neoplasm of unspecified site of right female breast: Secondary | ICD-10-CM

## 2016-03-08 DIAGNOSIS — Z86718 Personal history of other venous thrombosis and embolism: Secondary | ICD-10-CM | POA: Diagnosis not present

## 2016-03-08 DIAGNOSIS — M8580 Other specified disorders of bone density and structure, unspecified site: Secondary | ICD-10-CM

## 2016-03-08 DIAGNOSIS — I890 Lymphedema, not elsewhere classified: Secondary | ICD-10-CM | POA: Diagnosis not present

## 2016-03-08 DIAGNOSIS — G620 Drug-induced polyneuropathy: Secondary | ICD-10-CM

## 2016-03-08 DIAGNOSIS — D0511 Intraductal carcinoma in situ of right breast: Secondary | ICD-10-CM

## 2016-03-08 DIAGNOSIS — T451X5A Adverse effect of antineoplastic and immunosuppressive drugs, initial encounter: Secondary | ICD-10-CM

## 2016-03-08 LAB — COMPREHENSIVE METABOLIC PANEL
ALK PHOS: 84 U/L (ref 38–126)
ALT: 30 U/L (ref 14–54)
ANION GAP: 9 (ref 5–15)
AST: 32 U/L (ref 15–41)
Albumin: 4.1 g/dL (ref 3.5–5.0)
BUN: 10 mg/dL (ref 6–20)
CALCIUM: 9.2 mg/dL (ref 8.9–10.3)
CO2: 25 mmol/L (ref 22–32)
CREATININE: 0.72 mg/dL (ref 0.44–1.00)
Chloride: 104 mmol/L (ref 101–111)
Glucose, Bld: 115 mg/dL — ABNORMAL HIGH (ref 65–99)
Potassium: 3.9 mmol/L (ref 3.5–5.1)
Sodium: 138 mmol/L (ref 135–145)
TOTAL PROTEIN: 7.9 g/dL (ref 6.5–8.1)
Total Bilirubin: 0.5 mg/dL (ref 0.3–1.2)

## 2016-03-08 LAB — CBC WITH DIFFERENTIAL/PLATELET
Basophils Absolute: 0 10*3/uL (ref 0.0–0.1)
Basophils Relative: 0 %
EOS ABS: 0 10*3/uL (ref 0.0–0.7)
EOS PCT: 1 %
HCT: 34.9 % — ABNORMAL LOW (ref 36.0–46.0)
HEMOGLOBIN: 12.2 g/dL (ref 12.0–15.0)
LYMPHS ABS: 1.6 10*3/uL (ref 0.7–4.0)
LYMPHS PCT: 36 %
MCH: 31.3 pg (ref 26.0–34.0)
MCHC: 35 g/dL (ref 30.0–36.0)
MCV: 89.5 fL (ref 78.0–100.0)
MONOS PCT: 10 %
Monocytes Absolute: 0.4 10*3/uL (ref 0.1–1.0)
NEUTROS PCT: 53 %
Neutro Abs: 2.4 10*3/uL (ref 1.7–7.7)
Platelets: 155 10*3/uL (ref 150–400)
RBC: 3.9 MIL/uL (ref 3.87–5.11)
RDW: 13.9 % (ref 11.5–15.5)
WBC: 4.4 10*3/uL (ref 4.0–10.5)

## 2016-03-08 MED ORDER — ALPRAZOLAM 0.25 MG PO TABS: 0.2500 mg | ORAL_TABLET | Freq: Three times a day (TID) | ORAL | 2 refills | Status: DC | PRN

## 2016-03-08 MED ORDER — HYDROCODONE-ACETAMINOPHEN 10-325 MG PO TABS: 1.0000 | ORAL_TABLET | ORAL | 0 refills | Status: DC | PRN

## 2016-03-08 MED ORDER — LETROZOLE 2.5 MG PO TABS: 2.5000 mg | ORAL_TABLET | Freq: Every day | ORAL | 6 refills | Status: DC

## 2016-03-08 NOTE — Patient Instructions (Signed)
[place] at [facility] Discharge Instructions  RECOMMENDATIONS MADE BY THE CONSULTANT AND ANY TEST RESULTS WILL BE SENT TO YOUR REFERRING PHYSICIAN.  You were seen today by Dr. Oliva Bustard. Start taking Calcium and Vit D over the counter.  Start taking Femara, sent to your pharmacy. Return in 3 weeks for visit with Dr. Whitney Muse and labs.   Thank you for choosing [place] at [facility] to provide your oncology and hematology care.  To afford each patient quality time with our provider, please arrive at least 15 minutes before your scheduled appointment time.   Beginning January 23rd 2017 lab work for the [facility] will be done in the  Main lab at [facility] on 1st floor. If you have a lab appointment with the [facility] please come in thru the  Main Entrance and check in at the main information desk  You need to re-schedule your appointment should you arrive 10 or more minutes late.  We strive to give you quality time with our providers, and arriving late affects you and other patients whose appointments are after yours.  Also, if you no show three or more times for appointments you may be dismissed from the clinic at the providers discretion.     Again, thank you for choosing [facility].  Our hope is that these requests will decrease the amount of time that you wait before being seen by our physicians.       _____________________________________________________________  Should you have questions after your visit to [facility], please contact our office at [phone] between the hours of 8:30 a.m. and 4:30 p.m.  Voicemails left after 4:30 p.m. will not be returned until the following business day.  For prescription refill requests, have your pharmacy contact our office.         Resources For Cancer Patients and their Caregivers ? American Cancer Society: Can assist with transportation, wigs, general  needs, runs Look Good Feel Better.        906 593 9592 ? Cancer Care: Provides financial assistance, online support groups, medication/co-pay assistance.  1-800-813-HOPE 440 224 8718) ? [facility] Assists [place] cancer patients and their families through emotional , educational and financial support.  (352)304-7138 ? [place] DSS Where to apply for food stamps, Medicaid and utility assistance. (306)220-4797 ? [facility]: Transportation to medical appointments. (838) 060-6519 ? Social Security Administration: May apply for disability if have a Stage IV cancer. 613-243-9481 5032782218 ? [employer], Disability and Transit Services: Assists with nutrition, care and transit needs. [place] Support Programs: @10RELATIVEDAYS @ > Cancer Support Group  2nd Tuesday of the month 1pm-2pm, Journey Room  > Creative Journey  3rd Tuesday of the month 1130am-1pm, Journey Room  > Look Good Feel Better  1st Wednesday of the month 10am-12 noon, Journey Room (Call [facility] to register [phone])

## 2016-03-08 NOTE — Progress Notes (Signed)
[name]  Patient Care Team: Carly Burly, MD as PCP - General (Internal Medicine)  CHIEF COMPLAINTS/PURPOSE OF CONSULTATION:     Recurrent breast cancer ([place])   05/06/2015 Initial Biopsy    Incisional biopsy with Dr. Maryclare [name] of palpable mass, lower outer quadrant of reconstructed R breast close to nipple ER+ 97%, PR+ 70 (weak) HER 2 - by [facility] Dr Carly [name]      07/06/2015 Surgery    Definitive lumpectomy and LN dissection performed revealing a 3.1 cm primary 1/14 positive LN with a macro metastases, satellite nodules adjacent to the primary, LVI-, however she had skin invasion along with satellite nodules, T4b N1a, Stage IIIB, grade IIII,      07/07/2015 Imaging    Per records negative CT C/A/P and bone scan      08/15/2015 - 11/22/2015 Chemotherapy    AC x 4, Taxol X 4 both given dose dense                  HISTORY OF PRESENTING ILLNESS:  Carly [name] 35 y.o. 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 She noted a palpable abnormality in the reconstructed breast which was biopsied an revealed recurrence. ER/PR+ and HER 2 -. 1/14 LN positive for disease. Per report there were satellite nodules adjacent to the primary at the time of surgical excision. She notes that she had her implant removed and replaced.   [name] is unaccompanied.  2013 double mastectomy without nipple sparing with Dr. Lizbeth [name]? in [place]. Dr. Maryclare [name] began reconstruction in [place]. April 2014 she had implants put in. DCIS Stage I no lymph nodes positive. October 2016 she found a knot in the breast scar. Her PCP felt it and got an [place] to make sure it was okay. She returned the next week for a biopsy however the radiologist did not want to biopsy because it was so close to the implant. She had it biopsied with Dr. Lizbeth [name]. She was found to  have invasive adenocarcinoma Stage III with positive LN. She had the implant removed and a new one put in. She began chemotherapy in February and finished in May. SShe was told by Dr. Tressie [name] that she would be taking Tamoxifen for 10 years. She was given a prescription for Tamoxifen with several refills. She did not start taking this because she wanted to wait to be seen at [facility].  Status post radiation therapy and chemotherapy (with Cytoxan and Adriamycin followed by Taxol) PATIENT is perimenopausal Patient did not have any hysterectomy done in the past  Her ovaries and tubes were removed in November 2016. She still has her uterus. She was found to be BRCA+ when she was 35 yo. She has not received genetics testing since 2001.  She has been doing well with all of this. She is happy to be done with chemotherapy and nearly being done with radiation treatment. She has been using Aquafor, clear aloe, and sensitive Dove on her radiated breast. States, "The fold is the worst".   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 In the mornings her feet hurt so bad that she doesn't want to get out of the bed to stand on them. This improves through the day after taking neurontin and Vicodin. When she began experiencing  the neuropathy she went into the clinic crying and wanted her feet cut off, it hurt so badly. Her neurontin was increased to 600 daily. She is okay on this dosage along with the Vicodin.   She reports having a large water blister. She stands up very slowly from her chair to avoid discomfort in her feet. States, "Tennis shoes won't happen". She wears flip flops and notes she even has to wear them to the bathroom at night because she can't stand not having something on the bottom of her feet.   She does note trouble sleeping and will sometimes take her medication in the night to be able to sleep. After radiation  treatment, she often takes a nap.  She takes Protonix though not everyday for intermittent acid reflux. The acid reflux happens depending on what she eats.  She reports fatigue. "I just stay tired".   She had to try many types of anti-nausea medication because her nausea was terrible. She was to the point that she'd rather just throw up and get it over with. She finally found an anti-nausea that worked around her last round of treatment.   Patient is here for further follow-up and initiation of anti-hormonal therapy Continues to be very tired as well as has significant neuropathy also has a right upper extremity lymphedema and getting lymphedema therapy done. Also desires to get follow-up regarding her bone density as well as echocardiogram result  MEDICAL HISTORY:  Past Medical History:  Diagnosis Date  . Breast cancer ([place]) 2011/08/07   BRCA1 positive    SURGICAL HISTORY: Past Surgical History:  Procedure Laterality Date  . ABDOMINAL HYSTERECTOMY    . CESAREAN SECTION    . MASTECTOMY      SOCIAL HISTORY: Social History   Social History  . Marital status: Single    Spouse name: N/A  . Number of children: N/A  . Years of education: N/A   Occupational History  . Not on file.   Social History Main Topics  . Smoking status: Never Smoker  . Smokeless tobacco: Never Used  . Alcohol use No  . Drug use: No  . Sexual activity: Not on file   Other Topics Concern  . Not on file   Social History Narrative  . 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 Breast cancer Mother 33  . BRCA 1/2 Mother   . Prostate cancer Father     metastatic  . Breast cancer Sister 44    BRCA1 mutation  . Breast cancer Maternal Aunt     dx in her 62s; BRCA1 mutation  . Heart attack  Maternal Grandfather   . Ovarian cancer Paternal Grandmother     dx in her 37s  . Heart attack Paternal Grandfather   . Healthy Sister   . BRCA 1/2 Maternal Aunt   . Healthy Maternal Aunt     negative for a BRCA mutation  . Healthy Maternal Aunt     negative for a BRCA mutation   Mother still living with breast cancer BRCA+ diagnosed at 92 yo Father deceased in 08/06/2009 at 27 yo with prostate cancer mets to the bone. Her father was an only child and his father was adopted. 2 sisters and paternal brother 1 sister diagnosed with  breast cancer at 36 yo, had a double mastectomy and been in remission for 17 years  ALLERGIES:  is allergic to oxycodone-acetaminophen.  MEDICATIONS:  Current Outpatient Prescriptions  Medication Sig Dispense Refill  . ALPRAZolam (XANAX) 0.25 MG tablet Take one tablet three times a day as needed.    . cyclobenzaprine (FLEXERIL) 5 MG tablet Take by mouth.    . docusate sodium (COLACE) 100 MG capsule Take by mouth.    . gabapentin (NEURONTIN) 300 MG capsule Take 2 capsules (600 mg total) by mouth 3 (three) times daily. 180 capsule 3  . HYDROcodone-acetaminophen (NORCO) 10-325 MG tablet Take 1 tablet by mouth every 4 (four) hours as needed. 60 tablet 0  . ibuprofen (ADVIL,MOTRIN) 200 MG tablet Take by mouth.    . lidocaine-prilocaine (EMLA) cream Apply topically.    . ondansetron (ZOFRAN) 4 MG tablet TAKE ONE TABLET BY MOUTH EVERY 6 HOURS AS NEEDED FOR NAUSEA    . pantoprazole (PROTONIX) 40 MG tablet Take by mouth.    . prochlorperazine (COMPAZINE) 10 MG tablet Take by mouth.    . promethazine (PHENERGAN) 12.5 MG tablet Take by mouth.    . tamoxifen (NOLVADEX) 20 MG tablet Take 20 mg by mouth daily.  2  . triamterene-hydrochlorothiazide (MAXZIDE-25) 37.5-25 MG tablet Take one tab daily as needed for swelling.     No current facility-administered medications for this visit.     Review of Systems  Constitutional: Positive for malaise/fatigue.  HENT: Negative.     Eyes: Negative.   Respiratory: Negative.   Cardiovascular: Negative.   Gastrointestinal: Positive for heartburn.       Intermittent acid reflux managed with Protonix  Genitourinary: Negative.   Musculoskeletal: Negative.   Skin: Negative.        Radiation related skin changes to breast  Neurological: Positive for sensory change and weakness.  Endo/Heme/Allergies: Negative.   Psychiatric/Behavioral: Negative.   All other systems reviewed and are negative. 14 point ROS was done and is otherwise as detailed above or in HPI Continues to have significant neuropathy Right upper extremity lymphedema Tiredness Alopecia is slowly resolving  PHYSICAL EXAMINATION: ECOG PERFORMANCE STATUS: 1 - Symptomatic but completely ambulatory  There were no vitals filed for this visit. There were no vitals filed for this visit.  Physical Exam  Constitutional: She is oriented to person, place, and time and well-developed, well-nourished, and in no distress.  HENT:  Head: Normocephalic and atraumatic.  Nose: Nose normal.  Mouth/Throat: Oropharynx is clear and moist. No oropharyngeal exudate.  Eyes: Conjunctivae and EOM are normal. Pupils are equal, round, and reactive to light. Right eye exhibits no discharge. Left eye exhibits no discharge. No scleral icterus.  Neck: Normal range of motion. Neck supple. No tracheal deviation present. No thyromegaly present.  Cardiovascular: Normal rate, regular rhythm and normal heart sounds.  Exam reveals no gallop and no friction rub.   No murmur heard. Pulmonary/Chest: Effort normal and breath sounds normal. She has no wheezes. She has no rales.    Abdominal: Soft. Bowel sounds are normal. She exhibits no distension and no mass. There is no tenderness. There is no rebound and no guarding.  Musculoskeletal: Normal range of motion. She exhibits edema.  Right arm lymphedema present.   Lymphadenopathy:    She has no cervical adenopathy.  Neurological: She is alert  and oriented to person, place, and time. She has normal reflexes. No cranial nerve deficit. Gait normal. Coordination normal.  Skin: Skin is warm and dry. No rash  noted.  Psychiatric: Mood, memory, affect and judgment normal.  Nursing note and vitals reviewed.   LABORATORY DATA:  I have reviewed the data as listed Lab Results  Component Value Date   WBC 4.4 03/08/2016   HGB 12.2 03/08/2016   HCT 34.9 (L) 03/08/2016   MCV 89.5 03/08/2016   PLT 155 03/08/2016   CMP    RADIOGRAPHIC STUDIES: I have personally reviewed the radiological images as listed and agreed with the findings in the report. No results found.  ASSESSMENT & PLAN: Stage I DCIS BRCA+ diagnosed in 2013; double mastectomy in [place], reconstruction with Dr. Maryclare [name] Stage III Invasive adenocarcinoma diagnosed in 2016; chemotherapy with Dr. Tressie [name] and radiation with Dr. Pablo Ledger ER+ PR+ HER 2 - Last genetics testing done in 2001 Bilateral mastectomy Hysterectomy/oophorectomy HX DVT BRCA1 positivity Chemotherapy with AC followed by T given in a dose dense fashion Adjuvant XRT Chemotherapy induced neuropathy  Last seen by Dr. Tressie [name] on 12/12/15.  The patient is here to establish and continue care of breast cancer. She was given a NCCN guidelines information booklet about breast cancer. I reviewed this booklet with the patient at length. I spoke with the patient about aromatase inhibitors and long term follow up. She is an appropriate candidate for AI therapy and I would recommend this over tamoxifen for her.   Hormone levels will be checked after our visit today. She had an oophorectomy years back.   She has estradiol level is 10.  [place] level is 38 BOTH  are very close to menopause and level. Bone density shows osteopenia patient did not have any hysterectomy done and considering BRCA positive status I would like to consider aromatase inhibitor rather than tamoxifen at this point in time    [facility] 2.5  milligram by mouth daily has been started with calcium and vitamin D. Patient was advised to get bone density done once a year Was alerted regarding joint pains and bone pain associated with AI therapy Reevaluate patient in 4 months Available lab data has been reviewed  2.  Right upper extremity lymphedema Patient is undergoing lymphedema therapy patient was advised to call us if she has any redness or fever.      Forest Gleason, MD  03/08/2016 1:33 PM

## 2016-03-13 ENCOUNTER — Other Ambulatory Visit (HOSPITAL_COMMUNITY): Payer: Self-pay | Admitting: Oncology

## 2016-03-13 DIAGNOSIS — G620 Drug-induced polyneuropathy: Secondary | ICD-10-CM

## 2016-03-13 DIAGNOSIS — T451X5A Adverse effect of antineoplastic and immunosuppressive drugs, initial encounter: Secondary | ICD-10-CM

## 2016-03-13 MED ORDER — HYDROCODONE-ACETAMINOPHEN 10-325 MG PO TABS: 1.0000 | ORAL_TABLET | ORAL | 0 refills | Status: DC | PRN

## 2016-03-26 ENCOUNTER — Other Ambulatory Visit (HOSPITAL_COMMUNITY): Payer: Self-pay | Admitting: Oncology

## 2016-03-26 DIAGNOSIS — T451X5A Adverse effect of antineoplastic and immunosuppressive drugs, initial encounter: Secondary | ICD-10-CM

## 2016-03-26 DIAGNOSIS — G620 Drug-induced polyneuropathy: Secondary | ICD-10-CM

## 2016-03-26 MED ORDER — HYDROCODONE-ACETAMINOPHEN 10-325 MG PO TABS: 1.0000 | ORAL_TABLET | ORAL | 0 refills | Status: DC | PRN

## 2016-03-27 ENCOUNTER — Other Ambulatory Visit (HOSPITAL_COMMUNITY): Payer: Self-pay | Admitting: Oncology

## 2016-03-27 DIAGNOSIS — T451X5A Adverse effect of antineoplastic and immunosuppressive drugs, initial encounter: Secondary | ICD-10-CM

## 2016-03-27 DIAGNOSIS — G620 Drug-induced polyneuropathy: Secondary | ICD-10-CM

## 2016-03-27 MED ORDER — HYDROCODONE-ACETAMINOPHEN 10-325 MG PO TABS: 1.0000 | ORAL_TABLET | ORAL | 0 refills | Status: DC | PRN

## 2016-04-11 ENCOUNTER — Encounter (HOSPITAL_COMMUNITY): Payer: Medicaid Other | Attending: Oncology | Admitting: Oncology

## 2016-04-11 ENCOUNTER — Encounter (HOSPITAL_COMMUNITY): Payer: Self-pay | Admitting: Oncology

## 2016-04-11 ENCOUNTER — Encounter (HOSPITAL_COMMUNITY): Payer: Medicaid Other

## 2016-04-11 ENCOUNTER — Ambulatory Visit (HOSPITAL_COMMUNITY)
Admission: RE | Admit: 2016-04-11 | Discharge: 2016-04-11 | Disposition: A | Discharge status: Home / Self Care | Payer: Medicaid Other | Source: Ambulatory Visit | Attending: Oncology | Admitting: Oncology

## 2016-04-11 VITALS — BP 142/87 | HR 71 | Temp 98.4°F | Resp 18

## 2016-04-11 DIAGNOSIS — D0511 Intraductal carcinoma in situ of right breast: Secondary | ICD-10-CM

## 2016-04-11 DIAGNOSIS — G620 Drug-induced polyneuropathy: Secondary | ICD-10-CM | POA: Diagnosis not present

## 2016-04-11 DIAGNOSIS — C50911 Malignant neoplasm of unspecified site of right female breast: Secondary | ICD-10-CM

## 2016-04-11 DIAGNOSIS — R229 Localized swelling, mass and lump, unspecified: Secondary | ICD-10-CM

## 2016-04-11 DIAGNOSIS — D696 Thrombocytopenia, unspecified: Secondary | ICD-10-CM | POA: Diagnosis not present

## 2016-04-11 DIAGNOSIS — T451X5A Adverse effect of antineoplastic and immunosuppressive drugs, initial encounter: Secondary | ICD-10-CM

## 2016-04-11 DIAGNOSIS — R739 Hyperglycemia, unspecified: Secondary | ICD-10-CM | POA: Diagnosis not present

## 2016-04-11 LAB — CBC WITH DIFFERENTIAL/PLATELET
BASOS ABS: 0 10*3/uL (ref 0.0–0.1)
BASOS PCT: 0 %
EOS ABS: 0.1 10*3/uL (ref 0.0–0.7)
Eosinophils Relative: 2 %
HCT: 37.6 % (ref 36.0–46.0)
Hemoglobin: 12.8 g/dL (ref 12.0–15.0)
Lymphocytes Relative: 32 %
Lymphs Abs: 1.4 10*3/uL (ref 0.7–4.0)
MCH: 30.1 pg (ref 26.0–34.0)
MCHC: 34 g/dL (ref 30.0–36.0)
MCV: 88.5 fL (ref 78.0–100.0)
MONO ABS: 0.3 10*3/uL (ref 0.1–1.0)
MONOS PCT: 8 %
NEUTROS PCT: 58 %
Neutro Abs: 2.5 10*3/uL (ref 1.7–7.7)
Platelets: 133 10*3/uL — ABNORMAL LOW (ref 150–400)
RBC: 4.25 MIL/uL (ref 3.87–5.11)
RDW: 13.2 % (ref 11.5–15.5)
WBC: 4.3 10*3/uL (ref 4.0–10.5)

## 2016-04-11 LAB — TSH: TSH: 1.48 u[IU]/mL (ref 0.350–4.500)

## 2016-04-11 LAB — COMPREHENSIVE METABOLIC PANEL
ALBUMIN: 4.2 g/dL (ref 3.5–5.0)
ALT: 27 U/L (ref 14–54)
ANION GAP: 5 (ref 5–15)
AST: 28 U/L (ref 15–41)
Alkaline Phosphatase: 72 U/L (ref 38–126)
BUN: 10 mg/dL (ref 6–20)
CHLORIDE: 105 mmol/L (ref 101–111)
CO2: 26 mmol/L (ref 22–32)
Calcium: 9.4 mg/dL (ref 8.9–10.3)
Creatinine, Ser: 0.62 mg/dL (ref 0.44–1.00)
GFR calc Af Amer: >60 mL/min (ref >60)
GFR calc non Af Amer: >60 mL/min (ref >60)
GLUCOSE: 98 mg/dL (ref 65–99)
POTASSIUM: 4 mmol/L (ref 3.5–5.1)
SODIUM: 136 mmol/L (ref 135–145)
Total Bilirubin: 0.8 mg/dL (ref 0.3–1.2)
Total Protein: 7.8 g/dL (ref 6.5–8.1)

## 2016-04-11 MED ORDER — DULOXETINE HCL 30 MG PO CPEP: ORAL_CAPSULE | ORAL | 0 refills | Status: DC

## 2016-04-11 MED ORDER — HYDROCODONE-ACETAMINOPHEN 10-325 MG PO TABS: 1.0000 | ORAL_TABLET | ORAL | 0 refills | Status: DC | PRN

## 2016-04-11 NOTE — Patient Instructions (Addendum)
[place] at [facility] Discharge Instructions  RECOMMENDATIONS MADE BY THE CONSULTANT AND ANY TEST RESULTS WILL BE SENT TO YOUR REFERRING PHYSICIAN.  You were seen by Gershon Mussel today. Labs have been added Start Femara Return for follow up and labs within 4-6 weeks Ultra sound of right upper quadrant of abdomen Prescriptions given. Call for instructions for Gabapentin taper when you get Cymbalta.   Thank you for choosing [place] at [facility] to provide your oncology and hematology care.  To afford each patient quality time with our provider, please arrive at least 15 minutes before your scheduled appointment time.   Beginning January 23rd 2017 lab work for the [facility] will be done in the  Main lab at [facility] on 1st floor. If you have a lab appointment with the [facility] please come in thru the  Main Entrance and check in at the main information desk  You need to re-schedule your appointment should you arrive 10 or more minutes late.  We strive to give you quality time with our providers, and arriving late affects you and other patients whose appointments are after yours.  Also, if you no show three or more times for appointments you may be dismissed from the clinic at the providers discretion.     Again, thank you for choosing [facility].  Our hope is that these requests will decrease the amount of time that you wait before being seen by our physicians.       _____________________________________________________________  Should you have questions after your visit to [facility], please contact our office at [phone] between the hours of 8:30 a.m. and 4:30 p.m.  Voicemails left after 4:30 p.m. will not be returned until the following business day.  For prescription refill requests, have your pharmacy contact our office.         Resources For Cancer Patients and their Caregivers ? American  Cancer Society: Can assist with transportation, wigs, general needs, runs Look Good Feel Better.        548-716-0019 ? Cancer Care: Provides financial assistance, online support groups, medication/co-pay assistance.  1-800-813-HOPE 647-660-2456) ? [place] Assists [place] cancer patients and their families through emotional , educational and financial support.  781-076-4343 ? [place] DSS Where to apply for food stamps, Medicaid and utility assistance. 6782297024 ? [facility]: Transportation to medical appointments. 234 765 9468 ? Social Security Administration: May apply for disability if have a Stage IV cancer. (404)282-9627 825-018-1913 ? [employer], Disability and Transit Services: Assists with nutrition, care and transit needs. [place] Support Programs: @10RELATIVEDAYS @ > Cancer Support Group  2nd Tuesday of the month 1pm-2pm, Journey Room  > Creative Journey  3rd Tuesday of the month 1130am-1pm, Journey Room  > Look Good Feel Better  1st Wednesday of the month 10am-12 noon, Journey Room (Call [place] to register [phone])

## 2016-04-11 NOTE — Progress Notes (Signed)
Carly Burly, MD [place] [address]  Recurrent malignant neoplasm of right breast [facility]) - Plan: TSH, US Abdomen Limited, TSH, MR Abdomen W Contrast  Hyperglycemia - Plan: Hemoglobin A1C, Hemoglobin A1C  Chemotherapy-induced neuropathy (HCC) - Plan: HYDROcodone-acetaminophen (NORCO) 10-325 MG tablet  Subcutaneous nodule - Plan: US Abdomen Limited, MR Abdomen W Contrast  CURRENT THERAPY: Femara prescribed.  Patient has not yet started.  INTERVAL HISTORY: Carly [name] 35 y.o. female returns for followup of BRCA1+ right breast cancer ([id number]), Stage IIIB, S/P AC x 4 followed by Taxol x 4 in dose dense fashion followed by XRT finishing on 01/27/2016.    Recurrent breast cancer ([place])   05/06/2015 Initial Biopsy    Incisional biopsy with Dr. Maryclare Labrador of palpable mass, lower outer quadrant of reconstructed R breast close to nipple ER+ 97%, PR+ 70 (weak) HER 2 - by [facility]      07/06/2015 Surgery    Definitive lumpectomy and LN dissection performed revealing a 3.1 cm primary 1/14 positive LN with a macro metastases, satellite nodules adjacent to the primary, LVI-, however she had skin invasion along with satellite nodules, T4b N1a, Stage IIIB, grade IIII,      07/07/2015 Imaging    Per records negative CT C/A/P and bone scan      08/15/2015 - 11/22/2015 Chemotherapy    AC x 4, Taxol X 4 both given dose dense       - 01/27/2016 Radiation Therapy    Dr. Harlow Ohms. Pablo Ledger      02/06/2016 Imaging    Bone density- BMD as determined from [facility] L1-L4 is 1.021 g/cm2 with a T-Score of -1.3. This patient is considered osteopenic according to [facility]) criteria.       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  This is thought to be secondary to chemotherapy (Taxane).  She notes that the neuropathy in her feet is the worst.  She has mild symptoms in her hands.  She notes difficulty with wearing proper foot attire and she  avoid covers on her feet at night.  She has been on Gabapentin 1800 mg daily for some time.  She notes that this was effective at first, but is no longer effective.  She is using Vicodin, 6 per day for her pain.  She is educated on the proper and recommended treatment for peripheral neuropathy.  This does not include opiates.  She notes a new palpable area in her RUQ of abdomen that is new.  She noted on simple self exam.  She denies any pain.  She notes that it has caused a visible swelling in her abdomen.  She denies any recent changes of this mass.  She has not started her Femara due to her concern about progressive pain.  Therefore, she has NOT started anti-endocrine therapy.  Review of Systems  Constitutional: Negative.  Negative for chills and fever.  HENT: Negative.   Eyes: Negative.   Respiratory: Negative.  Negative for cough.   Cardiovascular: Negative.  Negative for chest pain.  Gastrointestinal: Negative for abdominal pain, nausea and vomiting.  Genitourinary: Negative.   Musculoskeletal: Positive for joint pain.       Peripheral neuropathy  Skin: Negative.   Neurological: Positive for tingling.  Endo/Heme/Allergies: Negative.   Psychiatric/Behavioral: Negative.     Past Medical History:  Diagnosis Date  . Breast cancer ([place]) 2013   BRCA1 positive    Past  Surgical History:  Procedure Laterality Date  . ABDOMINAL HYSTERECTOMY    . CESAREAN SECTION    . MASTECTOMY      Family History  Problem Relation Age of Onset  . Breast cancer Mother 5  . BRCA 1/2 Mother   . Prostate cancer Father     metastatic  . Breast cancer Sister 74    BRCA1 mutation  . Breast cancer Maternal Aunt     dx in her 21s; BRCA1 mutation  . Heart attack Maternal Grandfather   . Ovarian cancer Paternal Grandmother     dx in her 31s  . Heart attack Paternal Grandfather   . Healthy Sister   . BRCA 1/2 Maternal Aunt   . Healthy Maternal Aunt     negative for a BRCA mutation  . Healthy  Maternal Aunt     negative for a BRCA mutation    Social History   Social History  . Marital status: Single    Spouse name: N/A  . Number of children: N/A  . Years of education: N/A   Social History Main Topics  . Smoking status: Never Smoker  . Smokeless tobacco: Never Used  . Alcohol use No  . Drug use: No  . Sexual activity: Not Asked   Other Topics Concern  . None   Social History Narrative  . None     PHYSICAL EXAMINATION  ECOG PERFORMANCE STATUS: 1 - Symptomatic but completely ambulatory  Vitals:   04/11/16 1300  BP: (!) 142/87  Pulse: 71  Resp: 18  Temp: 98.4 F (36.9 C)    GENERAL:alert, no distress, comfortable, cooperative, obese, smiling and unaccompanied SKIN: skin color, texture, turgor are normal, no rashes or significant lesions HEAD: Normocephalic, No masses, lesions, tenderness or abnormalities EYES: normal, EOMI, Conjunctiva are pink and non-injected EARS: External ears normal OROPHARYNX:lips, buccal mucosa, and tongue normal  NECK: supple, trachea midline LYMPH:  not examined BREAST:not examined LUNGS: not examined HEART: not examined ABDOMEN:abdomen soft, non-tender, obese and RUQ nodule that 2-3 cm in size with irregular edges and rubbery in nature. BACK: Back symmetric, no curvature. EXTREMITIES:less then 2 second capillary refill, no joint deformities, effusion, or inflammation, no skin discoloration, no cyanosis  NEURO: alert & oriented x 3 with fluent speech, no focal motor/sensory deficits, gait normal   LABORATORY DATA: CBC    Component Value Date/Time   WBC 4.3 04/11/2016 1305   RBC 4.25 04/11/2016 1305   HGB 12.8 04/11/2016 1305   HCT 37.6 04/11/2016 1305   PLT 133 (L) 04/11/2016 1305   MCV 88.5 04/11/2016 1305   MCH 30.1 04/11/2016 1305   MCHC 34.0 04/11/2016 1305   RDW 13.2 04/11/2016 1305   LYMPHSABS 1.4 04/11/2016 1305   MONOABS 0.3 04/11/2016 1305   EOSABS 0.1 04/11/2016 1305   BASOSABS 0.0 04/11/2016 1305       Chemistry      Component Value Date/Time   NA 136 04/11/2016 1305   K 4.0 04/11/2016 1305   CL 105 04/11/2016 1305   CO2 26 04/11/2016 1305   BUN 10 04/11/2016 1305   CREATININE 0.62 04/11/2016 1305      Component Value Date/Time   CALCIUM 9.4 04/11/2016 1305   ALKPHOS 72 04/11/2016 1305   AST 28 04/11/2016 1305   ALT 27 04/11/2016 1305   BILITOT 0.8 04/11/2016 1305        PENDING LABS:   RADIOGRAPHIC STUDIES:  US Abdomen Limited  Result Date: 04/11/2016 CLINICAL DATA:  Right  upper quadrant nodule. EXAM: LIMITED ABDOMINAL ULTRASOUND COMPARISON:  No recent. FINDINGS: No cystic or solid abnormalities identified. Gadolinium-enhanced MRI of the abdomen can be obtained for further evaluation. IMPRESSION: No focal abnormality identified. Gadolinium-enhanced MRI of the abdomen can be obtained to further evaluate the patient's palpable nodule. Electronically Signed   By: Marcello Moores  Register   On: 04/11/2016 15:53     PATHOLOGY:    ASSESSMENT AND PLAN:  Recurrent breast cancer ([place]) BRCA1+, Stage III invasive adenocarcinoma right breast, ER/PR+ HER2 NEGATIVE, diagnosed in 2006 treated with definitive lumpectomy and LN dissection on 07/06/2015 followed by [facility] x 4 then T x 4 in dose dense fashion (08/15/2015- 11/22/2015) by Dr. Tressie Stalker, followed by XRT finishing on 01/27/2016.  She was prescribed Femara. AND Stage I DCIS BRCA+ diagnosed in 2013; double mastectomy in [place], reconstruction with Dr. Maryclare Labrador AND Bilateral mastectomy, Hysterectomy/oophorectomy HX DVT BRCA1 positivity Chemotherapy induced neuropathy  Labs today: CBC diff, CMET.  I personally reviewed and went over laboratory results with the patient.  The results are noted within this dictation.  Minimal thrombocytopenia is noted.  She asks if HGBA1C and TSH can be added.  We will see if these can be added to labs drawn today.  She also asks about a CA 125 and advised that this is not indicated, nor is a CA 27.29 for  screening purposes.  She has NOT STARTED Femara at this time as she is concerned for progression/worsening of her neuropathy pain.  She is advised to start this medication within the next week.  She notes a RUQ subcutaneous nodule that is palpable.  Order is placed for an [place] of this area.  We discussed the recommended treatment for chemotherapy-induced peripheral neuropathy.  She is advised that opiates are not recommended and discouraged for management of this issue.  As a result, in no confusing manner, she was advised that within the next 3-6 months, we will taper and cease from prescribing pain medication for peripheral neuropathy.  In the interim, we will continue to refill.  She is on 1800 mg of Gabapentin.  She notes that this used to work, but no longer does.  Therefore, we will transition to Cymbalta.  Additionally, she is given an Rx for cream for her peripheral neuropathy containing Baclofen, Amytriptyline, and ketamine.  This can be compounded at [facility] ([place] does not have this product).    Rx: Vicodin, Peripheral neuropathy cream, and Cymbalta.  She will call us if Cymbalta is affordable and then we can give her instruction on tapering Gabapentin.  Labs in 4-6 weeks: CBC diff, CMET.  Return in 4-6 weeks for follow-up.  Addendum: [place] is negative and radiology recommends MRI imaging.  Order is placed for MRI w contrast of abdomen.   ORDERS PLACED FOR THIS ENCOUNTER: Orders Placed This Encounter  Procedures  . US Abdomen Limited  . MR Abdomen W Contrast  . TSH  . Hemoglobin A1C    MEDICATIONS PRESCRIBED THIS ENCOUNTER: Meds ordered this encounter  Medications  . MAGNESIUM PO    Sig: Take by mouth.  . DISCONTD: HYDROcodone-acetaminophen (NORCO) 10-325 MG tablet    Sig: Take by mouth.  Marland Kitchen HYDROcodone-acetaminophen (NORCO) 10-325 MG tablet    Sig: Take 1 tablet by mouth every 4 (four) hours as needed.    Dispense:  100 tablet    Refill:  0    Order  Specific Question:   Supervising Provider    Answer:   Patrici Ranks [id number]  .  DULoxetine (CYMBALTA) 30 MG capsule    Sig: Take 30 mg PO daily x 5 days, then 60 mg PO daily thereafter    Dispense:  60 capsule    Refill:  0    Order Specific Question:   Supervising Provider    Answer:   Patrici Ranks [id number]    THERAPY PLAN:  She is advised to start Femara this week.    All questions were answered. The patient knows to call the clinic with any problems, questions or concerns. We can certainly see the patient much sooner if necessary.  Patient and plan discussed with Dr. Ancil Linsey and she is in agreement with the aforementioned.   This note is electronically signed by: Doy Mince 04/11/2016 7:09 PM

## 2016-04-11 NOTE — Assessment & Plan Note (Addendum)
BRCA1+, Stage III invasive adenocarcinoma right breast, ER/PR+ HER2 NEGATIVE, diagnosed in 2006 treated with definitive lumpectomy and LN dissection on 07/06/2015 followed by [facility] x 4 then T x 4 in dose dense fashion (08/15/2015- 11/22/2015) by Dr. Tressie Stalker, followed by XRT finishing on 01/27/2016.  She was prescribed Femara. AND Stage I DCIS BRCA+ diagnosed in 2013; double mastectomy in [place], reconstruction with Dr. Maryclare Labrador AND Bilateral mastectomy, Hysterectomy/oophorectomy HX DVT BRCA1 positivity Chemotherapy induced neuropathy  Labs today: CBC diff, CMET.  I personally reviewed and went over laboratory results with the patient.  The results are noted within this dictation.  Minimal thrombocytopenia is noted.  She asks if HGBA1C and TSH can be added.  We will see if these can be added to labs drawn today.  She also asks about a CA 125 and advised that this is not indicated, nor is a CA 27.29 for screening purposes.  She has NOT STARTED Femara at this time as she is concerned for progression/worsening of her neuropathy pain.  She is advised to start this medication within the next week.  She notes a RUQ subcutaneous nodule that is palpable.  Order is placed for an [place] of this area.  We discussed the recommended treatment for chemotherapy-induced peripheral neuropathy.  She is advised that opiates are not recommended and discouraged for management of this issue.  As a result, in no confusing manner, she was advised that within the next 3-6 months, we will taper and cease from prescribing pain medication for peripheral neuropathy.  In the interim, we will continue to refill.  She is on 1800 mg of Gabapentin.  She notes that this used to work, but no longer does.  Therefore, we will transition to Cymbalta.  Additionally, she is given an Rx for cream for her peripheral neuropathy containing Baclofen, Amytriptyline, and ketamine.  This can be compounded at [facility] ([place] does not have  this product).    Rx: Vicodin, Peripheral neuropathy cream, and Cymbalta.  She will call us if Cymbalta is affordable and then we can give her instruction on tapering Gabapentin.  Labs in 4-6 weeks: CBC diff, CMET.  Return in 4-6 weeks for follow-up.  Addendum: [place] is negative and radiology recommends MRI imaging.  Order is placed for MRI w contrast of abdomen.

## 2016-04-12 ENCOUNTER — Telehealth (HOSPITAL_COMMUNITY): Payer: Self-pay | Admitting: *Deleted

## 2016-04-12 LAB — HEMOGLOBIN A1C
Hgb A1c MFr Bld: 4.6 % — ABNORMAL LOW (ref 4.8–5.6)
MEAN PLASMA GLUCOSE: 85 mg/dL

## 2016-04-12 NOTE — Telephone Encounter (Signed)
Phoned pt and left message that ultra sound was neg.

## 2016-04-13 ENCOUNTER — Other Ambulatory Visit (HOSPITAL_COMMUNITY): Payer: Self-pay | Admitting: Oncology

## 2016-04-13 DIAGNOSIS — R229 Localized swelling, mass and lump, unspecified: Secondary | ICD-10-CM

## 2016-04-20 ENCOUNTER — Ambulatory Visit (HOSPITAL_COMMUNITY): Payer: Medicaid Other

## 2016-04-25 ENCOUNTER — Other Ambulatory Visit (HOSPITAL_COMMUNITY): Payer: Self-pay | Admitting: Oncology

## 2016-04-25 DIAGNOSIS — G620 Drug-induced polyneuropathy: Secondary | ICD-10-CM

## 2016-04-25 DIAGNOSIS — T451X5A Adverse effect of antineoplastic and immunosuppressive drugs, initial encounter: Secondary | ICD-10-CM

## 2016-04-25 MED ORDER — HYDROCODONE-ACETAMINOPHEN 10-325 MG PO TABS: 1.0000 | ORAL_TABLET | ORAL | 0 refills | Status: DC | PRN

## 2016-04-30 ENCOUNTER — Ambulatory Visit
Admission: RE | Admit: 2016-04-30 | Discharge: 2016-04-30 | Disposition: A | Discharge status: Home / Self Care | Payer: Medicaid Other | Source: Ambulatory Visit | Attending: Oncology | Admitting: Oncology

## 2016-04-30 DIAGNOSIS — R229 Localized swelling, mass and lump, unspecified: Secondary | ICD-10-CM

## 2016-04-30 MED ORDER — GADOBENATE DIMEGLUMINE 529 MG/ML IV SOLN
20.0000 mL | Freq: Once | INTRAVENOUS | Status: AC | PRN
  Administered 2016-04-30: 20 mL via INTRAVENOUS

## 2016-05-11 ENCOUNTER — Other Ambulatory Visit (HOSPITAL_COMMUNITY): Payer: Self-pay | Admitting: Oncology

## 2016-05-11 DIAGNOSIS — T451X5A Adverse effect of antineoplastic and immunosuppressive drugs, initial encounter: Secondary | ICD-10-CM

## 2016-05-11 DIAGNOSIS — G620 Drug-induced polyneuropathy: Secondary | ICD-10-CM

## 2016-05-11 MED ORDER — HYDROCODONE-ACETAMINOPHEN 10-325 MG PO TABS: 1.0000 | ORAL_TABLET | ORAL | 0 refills | Status: DC | PRN

## 2016-05-14 ENCOUNTER — Other Ambulatory Visit (HOSPITAL_COMMUNITY): Payer: Self-pay | Admitting: *Deleted

## 2016-05-14 DIAGNOSIS — C50911 Malignant neoplasm of unspecified site of right female breast: Secondary | ICD-10-CM

## 2016-05-14 DIAGNOSIS — T451X5A Adverse effect of antineoplastic and immunosuppressive drugs, initial encounter: Secondary | ICD-10-CM

## 2016-05-14 DIAGNOSIS — D701 Agranulocytosis secondary to cancer chemotherapy: Secondary | ICD-10-CM

## 2016-05-15 ENCOUNTER — Encounter (HOSPITAL_COMMUNITY): Payer: Medicaid Other

## 2016-05-15 ENCOUNTER — Encounter (HOSPITAL_COMMUNITY): Payer: Self-pay | Admitting: Hematology & Oncology

## 2016-05-15 ENCOUNTER — Encounter (HOSPITAL_COMMUNITY): Payer: Medicaid Other | Attending: Hematology & Oncology | Admitting: Hematology & Oncology

## 2016-05-15 VITALS — Temp 97.7°F | Resp 18 | Wt 277.3 lb

## 2016-05-15 DIAGNOSIS — G620 Drug-induced polyneuropathy: Secondary | ICD-10-CM | POA: Diagnosis not present

## 2016-05-15 DIAGNOSIS — D701 Agranulocytosis secondary to cancer chemotherapy: Secondary | ICD-10-CM

## 2016-05-15 DIAGNOSIS — R51 Headache: Secondary | ICD-10-CM

## 2016-05-15 DIAGNOSIS — Z1509 Genetic susceptibility to other malignant neoplasm: Secondary | ICD-10-CM

## 2016-05-15 DIAGNOSIS — Z86718 Personal history of other venous thrombosis and embolism: Secondary | ICD-10-CM

## 2016-05-15 DIAGNOSIS — Z1501 Genetic susceptibility to malignant neoplasm of breast: Secondary | ICD-10-CM

## 2016-05-15 DIAGNOSIS — T451X5A Adverse effect of antineoplastic and immunosuppressive drugs, initial encounter: Secondary | ICD-10-CM | POA: Diagnosis not present

## 2016-05-15 DIAGNOSIS — J0100 Acute maxillary sinusitis, unspecified: Secondary | ICD-10-CM

## 2016-05-15 DIAGNOSIS — M8580 Other specified disorders of bone density and structure, unspecified site: Secondary | ICD-10-CM

## 2016-05-15 DIAGNOSIS — C50911 Malignant neoplasm of unspecified site of right female breast: Secondary | ICD-10-CM | POA: Diagnosis present

## 2016-05-15 DIAGNOSIS — R05 Cough: Secondary | ICD-10-CM

## 2016-05-15 DIAGNOSIS — R0981 Nasal congestion: Secondary | ICD-10-CM | POA: Diagnosis not present

## 2016-05-15 DIAGNOSIS — D696 Thrombocytopenia, unspecified: Secondary | ICD-10-CM | POA: Diagnosis not present

## 2016-05-15 DIAGNOSIS — Z79811 Long term (current) use of aromatase inhibitors: Secondary | ICD-10-CM

## 2016-05-15 DIAGNOSIS — M8589 Other specified disorders of bone density and structure, multiple sites: Secondary | ICD-10-CM

## 2016-05-15 DIAGNOSIS — Z780 Asymptomatic menopausal state: Secondary | ICD-10-CM

## 2016-05-15 LAB — COMPREHENSIVE METABOLIC PANEL
ALBUMIN: 3.8 g/dL (ref 3.5–5.0)
ALK PHOS: 72 U/L (ref 38–126)
ALT: 25 U/L (ref 14–54)
ANION GAP: 6 (ref 5–15)
AST: 20 U/L (ref 15–41)
BILIRUBIN TOTAL: 0.6 mg/dL (ref 0.3–1.2)
BUN: 10 mg/dL (ref 6–20)
CALCIUM: 9.1 mg/dL (ref 8.9–10.3)
CO2: 28 mmol/L (ref 22–32)
Chloride: 104 mmol/L (ref 101–111)
Creatinine, Ser: 0.67 mg/dL (ref 0.44–1.00)
GFR calc Af Amer: >60 mL/min (ref >60)
GFR calc non Af Amer: >60 mL/min (ref >60)
GLUCOSE: 95 mg/dL (ref 65–99)
Potassium: 3.9 mmol/L (ref 3.5–5.1)
Sodium: 138 mmol/L (ref 135–145)
TOTAL PROTEIN: 7.4 g/dL (ref 6.5–8.1)

## 2016-05-15 LAB — CBC WITH DIFFERENTIAL/PLATELET
BASOS PCT: 0 %
Basophils Absolute: 0 10*3/uL (ref 0.0–0.1)
Eosinophils Absolute: 0.1 10*3/uL (ref 0.0–0.7)
Eosinophils Relative: 2 %
HEMATOCRIT: 35.5 % — AB (ref 36.0–46.0)
HEMOGLOBIN: 11.9 g/dL — AB (ref 12.0–15.0)
LYMPHS PCT: 33 %
Lymphs Abs: 1.5 10*3/uL (ref 0.7–4.0)
MCH: 30.1 pg (ref 26.0–34.0)
MCHC: 33.5 g/dL (ref 30.0–36.0)
MCV: 89.9 fL (ref 78.0–100.0)
MONOS PCT: 10 %
Monocytes Absolute: 0.4 10*3/uL (ref 0.1–1.0)
NEUTROS ABS: 2.5 10*3/uL (ref 1.7–7.7)
NEUTROS PCT: 55 %
Platelets: 145 10*3/uL — ABNORMAL LOW (ref 150–400)
RBC: 3.95 MIL/uL (ref 3.87–5.11)
RDW: 13.2 % (ref 11.5–15.5)
WBC: 4.5 10*3/uL (ref 4.0–10.5)

## 2016-05-15 MED ORDER — AZITHROMYCIN 250 MG PO TABS: ORAL_TABLET | ORAL | 0 refills | Status: DC

## 2016-05-15 MED ORDER — BENZONATATE 100 MG PO CAPS: 100.0000 mg | ORAL_CAPSULE | Freq: Three times a day (TID) | ORAL | 0 refills | Status: DC | PRN

## 2016-05-15 NOTE — Patient Instructions (Signed)
[place] at [facility] Discharge Instructions  RECOMMENDATIONS MADE BY THE CONSULTANT AND ANY TEST RESULTS WILL BE SENT TO YOUR REFERRING PHYSICIAN.  Exam with Dr. Kyandre Okray Muse today. Return as scheduled.     Thank you for choosing [place] at [facility] to provide your oncology and hematology care.  To afford each patient quality time with our provider, please arrive at least 15 minutes before your scheduled appointment time.   Beginning January 23rd 2017 lab work for the [facility] will be done in the  Main lab at [facility] on 1st floor. If you have a lab appointment with the [facility] please come in thru the  Main Entrance and check in at the main information desk  You need to re-schedule your appointment should you arrive 10 or more minutes late.  We strive to give you quality time with our providers, and arriving late affects you and other patients whose appointments are after yours.  Also, if you no show three or more times for appointments you may be dismissed from the clinic at the providers discretion.     Again, thank you for choosing [facility].  Our hope is that these requests will decrease the amount of time that you wait before being seen by our physicians.       _____________________________________________________________  Should you have questions after your visit to [facility], please contact our office at [phone] between the hours of 8:30 a.m. and 4:30 p.m.  Voicemails left after 4:30 p.m. will not be returned until the following business day.  For prescription refill requests, have your pharmacy contact our office.         Resources For Cancer Patients and their Caregivers ? American Cancer Society: Can assist with transportation, wigs, general needs, runs Look Good Feel Better.        404-008-8136 ? Cancer Care: Provides financial assistance, online support groups,  medication/co-pay assistance.  1-800-813-HOPE 862-654-2716) ? [facility] Assists [place] cancer patients and their families through emotional , educational and financial support.  763-842-1812 ? [place] DSS Where to apply for food stamps, Medicaid and utility assistance. 267-725-6686 ? [facility]: Transportation to medical appointments. (503)063-4499 ? Social Security Administration: May apply for disability if have a Stage IV cancer. 574-285-8790 (901)111-7948 ? [employer], Disability and Transit Services: Assists with nutrition, care and transit needs. [place] Support Programs: @10RELATIVEDAYS @ > Cancer Support Group  2nd Tuesday of the month 1pm-2pm, Journey Room  > Creative Journey  3rd Tuesday of the month 1130am-1pm, Journey Room  > Look Good Feel Better  1st Wednesday of the month 10am-12 noon, Journey Room (Call [place] to register [phone])

## 2016-05-15 NOTE — Progress Notes (Signed)
Carly [name]  Progress Note  Patient Care Team: [name], MD as PCP - General (Internal Medicine)  CHIEF COMPLAINTS/PURPOSE OF CONSULTATION:    Recurrent breast cancer ([place])   05/06/2015 Initial Biopsy    Incisional biopsy with Dr. Maryclare Labrador of palpable mass, lower outer quadrant of reconstructed R breast close to nipple ER+ 97%, PR+ 70 (weak) HER 2 - by [facility]      07/06/2015 Surgery    Definitive lumpectomy and LN dissection performed revealing a 3.1 cm primary 1/14 positive LN with a macro metastases, satellite nodules adjacent to the primary, LVI-, however she had skin invasion along with satellite nodules, T4b N1a, Stage IIIB, grade IIII,      07/07/2015 Imaging    Per records negative CT C/A/P and bone scan      08/15/2015 - 11/22/2015 Chemotherapy    AC x 4, Taxol X 4 both given dose dense       - 01/27/2016 Radiation Therapy    Dr. Harlow Ohms. Pablo Ledger      02/06/2016 Imaging    Bone density- BMD as determined from [facility] Sp[facility] L1-L4 is 1.021 g/cm2 with a T-Score of -1.3. This patient is considered osteopenic according to [facility]) criteria.        HISTORY OF PRESENTING ILLNESS:  Carly [name] 35 y.o. 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 She noted a palpable abnormality in the reconstructed breast which was biopsied an revealed recurrence. ER/PR+ and HER 2 -. 1/14 LN positive for disease. Per report there were satellite nodules adjacent to the primary at the time of surgical excision. She notes that she had her implant removed and replaced.   Carly [name] is unaccompanied. She is on Femara.    She complains of a headache, cough, and sinus congestion. She is not sure if the headache is from the Femara or sinus pressure. This headache began the day after taking the first Femara. The cough and sinus  congestion have been present over the past week.  She reports when she gets up in the mornings her legs ache as though she has the flu. She was previously told to wean herself off the neurontin and switch to Cymbalta. She is enquiring if she can maybe take both together if it helps her neuropathy. She notes that this is her biggest problem since completing therapy. She has not been able to pick up the compounded cream for her feet.  She sees her dentist regularly every 6 months.   She denies abdominal pain. She has not had a flu shot this year.   MEDICAL HISTORY:  Past Medical History:  Diagnosis Date  . Breast cancer ([place]) 2013   BRCA1 positive    SURGICAL HISTORY: Past Surgical History:  Procedure Laterality Date  . ABDOMINAL HYSTERECTOMY    . CESAREAN SECTION    . MASTECTOMY      SOCIAL HISTORY: Social History   Social History  . Marital status: Single    Spouse name: N/A  . Number of children: N/A  . Years of education: N/A   Occupational History  . Not on file.   Social History Main Topics  . Smoking status: Never Smoker  . Smokeless tobacco: Never Used  . Alcohol use No  . Drug use: No  . Sexual activity: Not on file   Other Topics Concern  .  Not on file   Social History Narrative  . 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 Breast cancer Mother 21  . BRCA 1/2 Mother   . Prostate cancer Father     metastatic  . Breast cancer Sister 21    BRCA1 mutation  . Breast cancer Maternal Aunt     dx in her 88s; BRCA1 mutation  . Heart attack Maternal Grandfather   . Ovarian cancer Paternal Grandmother     dx in her 39s  . Heart attack Paternal Grandfather   . Healthy Sister   . BRCA 1/2 Maternal Aunt   . Healthy Maternal Aunt     negative for a  BRCA mutation  . Healthy Maternal Aunt     negative for a BRCA mutation   Mother still living with breast cancer BRCA+ diagnosed at 37 yo Father deceased in 08-29-2009 at 70 yo with prostate cancer mets to the bone. Her father was an only child and his father was adopted. 2 sisters and paternal brother 1 sister diagnosed with breast cancer at 71 yo, had a double mastectomy and been in remission for 17 years  ALLERGIES:  is allergic to oxycodone-acetaminophen.  MEDICATIONS:  Current Outpatient Prescriptions  Medication Sig Dispense Refill  . ALPRAZolam (XANAX) 0.25 MG tablet Take 1 tablet (0.25 mg total) by mouth 3 (three) times daily as needed for anxiety. 50 tablet 2  . cyclobenzaprine (FLEXERIL) 5 MG tablet Take by mouth.    . docusate sodium (COLACE) 100 MG capsule Take by mouth.    . DULoxetine (CYMBALTA) 30 MG capsule Take 30 mg PO daily x 5 days, then 60 mg PO daily thereafter 60 capsule 0  . gabapentin (NEURONTIN) 300 MG capsule Take 2 capsules (600 mg total) by mouth 3 (three) times daily. 180 capsule 3  . HYDROcodone-acetaminophen (NORCO) 10-325 MG tablet Take 1 tablet by mouth every 4 (four) hours as needed. 100 tablet 0  . ibuprofen (ADVIL,MOTRIN) 200 MG tablet Take by mouth.    . letrozole (FEMARA) 2.5 MG tablet Take 1 tablet (2.5 mg total) by mouth daily. 30 tablet 6  . lidocaine-prilocaine (EMLA) cream Apply topically.    Marland Kitchen MAGNESIUM PO Take by mouth.    . ondansetron (ZOFRAN) 4 MG tablet TAKE ONE TABLET BY MOUTH EVERY 6 HOURS AS NEEDED FOR NAUSEA    . pantoprazole (PROTONIX) 40 MG tablet Take by mouth.    . prochlorperazine (COMPAZINE) 10 MG tablet Take by mouth.    . promethazine (PHENERGAN) 12.5 MG tablet Take by mouth.    . triamterene-hydrochlorothiazide (MAXZIDE-25) 37.5-25 MG tablet Take one tab daily as needed for swelling.     No current facility-administered medications for this visit.     Review of Systems  Constitutional: Positive for malaise/fatigue.  HENT:  Positive for congestion.   Eyes: Negative.   Respiratory: Positive for cough.   Cardiovascular: Negative.   Gastrointestinal: Positive for heartburn.       Intermittent acid reflux managed with Protonix  Genitourinary: Negative.   Musculoskeletal: Negative.   Skin: Negative.   Neurological: Positive for tingling and headaches.       Chemotherapy induced neuropathy  Endo/Heme/Allergies: Negative.   Psychiatric/Behavioral: Negative.   All other systems reviewed and are negative. 14 point  ROS was done and is otherwise as detailed above or in HPI   PHYSICAL EXAMINATION: ECOG PERFORMANCE STATUS: 1 - Symptomatic but completely ambulatory  Vitals:   05/15/16 1153  Resp: 18  Temp: 97.7 F (36.5 C)   Filed Weights   05/15/16 1153  Weight: 277 lb 4.8 oz (125.8 kg)    Physical Exam  Constitutional: She is oriented to person, place, and time and well-developed, well-nourished, and in no distress.  HENT:  Head: Normocephalic and atraumatic.  Nose: Nose normal.  Mouth/Throat: Oropharynx is clear and moist. No oropharyngeal exudate.  Eyes: Conjunctivae and EOM are normal. Pupils are equal, round, and reactive to light. Right eye exhibits no discharge. Left eye exhibits no discharge. No scleral icterus.  Neck: Normal range of motion. Neck supple. No tracheal deviation present. No thyromegaly present.  Cardiovascular: Normal rate, regular rhythm and normal heart sounds.  Exam reveals no gallop and no friction rub.   No murmur heard. Pulmonary/Chest: Effort normal and breath sounds normal. She has no wheezes. She has no rales.  Abdominal: Soft. Bowel sounds are normal. She exhibits no distension and no mass. There is no tenderness. There is no rebound and no guarding.  Musculoskeletal: Normal range of motion. She exhibits edema.  Right arm lymphedema present.   Lymphadenopathy:    She has no cervical adenopathy.  Neurological: She is alert and oriented to person, place, and time. She has  normal reflexes. No cranial nerve deficit. Gait normal. Coordination normal.  Skin: Skin is warm and dry. No rash noted.  Psychiatric: Mood, memory, affect and judgment normal.  Nursing note and vitals reviewed.   LABORATORY DATA:  I have reviewed the data as listed Lab Results  Component Value Date   WBC 4.5 05/15/2016   HGB 11.9 (L) 05/15/2016   HCT 35.5 (L) 05/15/2016   MCV 89.9 05/15/2016   PLT 145 (L) 05/15/2016   CMP     Component Value Date/Time   NA 138 05/15/2016 1120   K 3.9 05/15/2016 1120   CL 104 05/15/2016 1120   CO2 28 05/15/2016 1120   GLUCOSE 95 05/15/2016 1120   BUN 10 05/15/2016 1120   CREATININE 0.67 05/15/2016 1120   CALCIUM 9.1 05/15/2016 1120   PROT 7.4 05/15/2016 1120   ALBUMIN 3.8 05/15/2016 1120   AST 20 05/15/2016 1120   ALT 25 05/15/2016 1120   ALKPHOS 72 05/15/2016 1120   BILITOT 0.6 05/15/2016 1120   GFRNONAA >60 05/15/2016 1120   GFRAA >60 05/15/2016 1120     RADIOGRAPHIC STUDIES: I have personally reviewed the radiological images as listed and agreed with the findings in the report. No results found. Study Result   CLINICAL DATA:  Right upper quadrant palpable lump mild pole nodule. Negative ultrasound. History of breast cancer.  EXAM: MRI ABDOMEN WITHOUT AND WITH CONTRAST  TECHNIQUE: Multiplanar multisequence MR imaging of the abdomen was performed both before and after the administration of intravenous contrast.  CONTRAST:  57m MULTIHANCE GADOBENATE DIMEGLUMINE 529 MG/ML IV SOLN  COMPARISON:  None.  FINDINGS: Bones/Joint/Cartilage  No marrow signal abnormality. No acute fracture or subluxation.  Ligaments, Muscles and Tendons Muscles are normal. No muscle edema. No intramuscular fluid collection or hematoma.  Soft tissue Area of concern in the right upper quadrant is marked with multiple skin markers delineating the margins of the palpable abnormality. No soft tissue abnormality at the site of clinical  concern.  No of abnormal enhancement.  No fluid collection or hematoma.  Visualised  liver, gallbladder, kidneys, pancreas, spleen and adrenal glands are normal. No abdominal free fluid. Incidentally noted is a right breast implant.  IMPRESSION: 1. No soft tissue abnormality at the site of clinical palpable concern in the right upper quadrant.   Electronically Signed   By: Kathreen Devoid   On: 05/01/2016 10:13    ASSESSMENT & PLAN: Stage I DCIS BRCA+ diagnosed in 2013; double mastectomy in [place], reconstruction with Dr. Maryclare Labrador Stage III Invasive adenocarcinoma diagnosed in 2016; chemotherapy with Dr. Tressie Stalker and radiation with Dr. Pablo Ledger ER+ PR+ HER 2 - Last genetics testing done in 2001 Bilateral mastectomy Hysterectomy/oophorectomy HX DVT BRCA1 positivity Chemotherapy with AC followed by T given in a dose dense fashion Adjuvant XRT Chemotherapy induced neuropathy Osteopenia on DEXA 01/2016 Mild thrombocytopenia sinusitis   She has started Femara. I have encouraged her to continue. She will need prolia given her osteopenia, age, Alysia Penna use and postmenopausal status.  She complains of headache, cough, and sinus congestion. I have prescribed a Z-pak and tessalon perles. ([facility]) If her headache does not resolve in the next week, she will contact our office.   I advised the patient how to take Cymbalta and Neurontin to manage chemotherapy-induced neuropathy. I will consider a referral to [place] for PT in the future.  At her next visit we will discuss having her port revised since she does not have good venous access.  She was not able to receive a flu shot today due to current illness. She will either get this done with her pharmacy or receive flu shot at her next visit in our clinic.  She does not need any refills at this time.  She will return for follow up in 1 month.  Meds ordered this encounter  Medications  . DISCONTD: azithromycin  (ZITHROMAX Z-PAK) 250 MG tablet    Sig: Take as directed.    Dispense:  6 each    Refill:  0  . DISCONTD: benzonatate (TESSALON) 100 MG capsule    Sig: Take 1 capsule (100 mg total) by mouth 3 (three) times daily as needed for cough.    Dispense:  20 capsule    Refill:  0    All questions were answered. The patient knows to call the clinic with any problems, questions or concerns.  This document serves as a record of services personally performed by Ancil Linsey, MD. It was created on her behalf by Arlyce Harman, a trained medical scribe. The creation of this record is based on the scribe's personal observations and the provider's statements to them. This document has been checked and approved by the attending provider.  I have reviewed the above documentation for accuracy and completeness, and I agree with the above.  This note was electronically signed.  Molli Hazard, MD  05/15/2016 12:15 PM

## 2016-05-24 ENCOUNTER — Other Ambulatory Visit (HOSPITAL_COMMUNITY): Payer: Self-pay | Admitting: Oncology

## 2016-05-24 DIAGNOSIS — G620 Drug-induced polyneuropathy: Secondary | ICD-10-CM

## 2016-05-24 DIAGNOSIS — T451X5A Adverse effect of antineoplastic and immunosuppressive drugs, initial encounter: Secondary | ICD-10-CM

## 2016-05-24 MED ORDER — HYDROCODONE-ACETAMINOPHEN 10-325 MG PO TABS: 1.0000 | ORAL_TABLET | ORAL | 0 refills | Status: DC | PRN

## 2016-05-25 ENCOUNTER — Other Ambulatory Visit (HOSPITAL_COMMUNITY): Payer: Self-pay | Admitting: Hematology & Oncology

## 2016-05-25 DIAGNOSIS — C50911 Malignant neoplasm of unspecified site of right female breast: Secondary | ICD-10-CM

## 2016-06-08 ENCOUNTER — Ambulatory Visit (HOSPITAL_COMMUNITY): Payer: Medicaid Other | Admitting: Hematology & Oncology

## 2016-06-08 ENCOUNTER — Other Ambulatory Visit (HOSPITAL_COMMUNITY): Payer: Medicaid Other

## 2016-06-11 ENCOUNTER — Other Ambulatory Visit (HOSPITAL_COMMUNITY): Payer: Self-pay | Admitting: Oncology

## 2016-06-11 DIAGNOSIS — T451X5A Adverse effect of antineoplastic and immunosuppressive drugs, initial encounter: Secondary | ICD-10-CM

## 2016-06-11 DIAGNOSIS — G620 Drug-induced polyneuropathy: Secondary | ICD-10-CM

## 2016-06-11 MED ORDER — HYDROCODONE-ACETAMINOPHEN 10-325 MG PO TABS: 1.0000 | ORAL_TABLET | ORAL | 0 refills | Status: DC | PRN

## 2016-06-14 ENCOUNTER — Encounter (HOSPITAL_COMMUNITY): Payer: Medicaid Other

## 2016-06-14 ENCOUNTER — Encounter (HOSPITAL_BASED_OUTPATIENT_CLINIC_OR_DEPARTMENT_OTHER): Payer: Medicaid Other

## 2016-06-14 ENCOUNTER — Encounter (HOSPITAL_COMMUNITY): Payer: Medicaid Other | Attending: Adult Health | Admitting: Adult Health

## 2016-06-14 ENCOUNTER — Encounter (HOSPITAL_COMMUNITY): Payer: Self-pay | Admitting: Adult Health

## 2016-06-14 ENCOUNTER — Ambulatory Visit (HOSPITAL_COMMUNITY): Payer: Medicaid Other

## 2016-06-14 VITALS — BP 130/77 | HR 88 | Temp 97.8°F | Resp 16 | Wt 270.4 lb

## 2016-06-14 DIAGNOSIS — Z79811 Long term (current) use of aromatase inhibitors: Secondary | ICD-10-CM

## 2016-06-14 DIAGNOSIS — C50911 Malignant neoplasm of unspecified site of right female breast: Secondary | ICD-10-CM

## 2016-06-14 DIAGNOSIS — G620 Drug-induced polyneuropathy: Secondary | ICD-10-CM

## 2016-06-14 DIAGNOSIS — D701 Agranulocytosis secondary to cancer chemotherapy: Secondary | ICD-10-CM | POA: Diagnosis not present

## 2016-06-14 DIAGNOSIS — Z0000 Encounter for general adult medical examination without abnormal findings: Secondary | ICD-10-CM

## 2016-06-14 DIAGNOSIS — Z23 Encounter for immunization: Secondary | ICD-10-CM

## 2016-06-14 DIAGNOSIS — M8580 Other specified disorders of bone density and structure, unspecified site: Secondary | ICD-10-CM

## 2016-06-14 DIAGNOSIS — K219 Gastro-esophageal reflux disease without esophagitis: Secondary | ICD-10-CM | POA: Diagnosis not present

## 2016-06-14 DIAGNOSIS — T451X5A Adverse effect of antineoplastic and immunosuppressive drugs, initial encounter: Principal | ICD-10-CM

## 2016-06-14 LAB — CBC WITH DIFFERENTIAL/PLATELET
BASOS ABS: 0 10*3/uL (ref 0.0–0.1)
Basophils Relative: 0 %
EOS PCT: 3 %
Eosinophils Absolute: 0.1 10*3/uL (ref 0.0–0.7)
HCT: 37.2 % (ref 36.0–46.0)
Hemoglobin: 12.6 g/dL (ref 12.0–15.0)
LYMPHS ABS: 1.7 10*3/uL (ref 0.7–4.0)
Lymphocytes Relative: 42 %
MCH: 30.7 pg (ref 26.0–34.0)
MCHC: 33.9 g/dL (ref 30.0–36.0)
MCV: 90.5 fL (ref 78.0–100.0)
MONO ABS: 0.4 10*3/uL (ref 0.1–1.0)
Monocytes Relative: 9 %
Neutro Abs: 1.9 10*3/uL (ref 1.7–7.7)
Neutrophils Relative %: 46 %
PLATELETS: 128 10*3/uL — AB (ref 150–400)
RBC: 4.11 MIL/uL (ref 3.87–5.11)
RDW: 13.3 % (ref 11.5–15.5)
WBC: 4.1 10*3/uL (ref 4.0–10.5)

## 2016-06-14 LAB — COMPREHENSIVE METABOLIC PANEL
ALT: 27 U/L (ref 14–54)
AST: 25 U/L (ref 15–41)
Albumin: 4 g/dL (ref 3.5–5.0)
Alkaline Phosphatase: 71 U/L (ref 38–126)
Anion gap: 7 (ref 5–15)
BUN: 8 mg/dL (ref 6–20)
CHLORIDE: 103 mmol/L (ref 101–111)
CO2: 28 mmol/L (ref 22–32)
CREATININE: 0.68 mg/dL (ref 0.44–1.00)
Calcium: 9.3 mg/dL (ref 8.9–10.3)
GFR calc non Af Amer: >60 mL/min (ref >60)
Glucose, Bld: 85 mg/dL (ref 65–99)
POTASSIUM: 3.8 mmol/L (ref 3.5–5.1)
SODIUM: 138 mmol/L (ref 135–145)
Total Bilirubin: 0.7 mg/dL (ref 0.3–1.2)
Total Protein: 7.3 g/dL (ref 6.5–8.1)

## 2016-06-14 MED ORDER — DENOSUMAB 60 MG/ML ~~LOC~~ SOLN
60.0000 mg | Freq: Once | SUBCUTANEOUS | Status: AC
  Administered 2016-06-14: 60 mg via SUBCUTANEOUS
  Filled 2016-06-14: qty 1

## 2016-06-14 MED ORDER — INFLUENZA VAC SPLIT QUAD 0.5 ML IM SUSY
0.5000 mL | PREFILLED_SYRINGE | Freq: Once | INTRAMUSCULAR | Status: AC
  Administered 2016-06-14: 0.5 mL via INTRAMUSCULAR

## 2016-06-14 MED ORDER — PANTOPRAZOLE SODIUM 40 MG PO TBEC: 40.0000 mg | DELAYED_RELEASE_TABLET | Freq: Every day | ORAL | 3 refills | Status: DC

## 2016-06-14 MED ORDER — DULOXETINE HCL 60 MG PO CPEP: 60.0000 mg | ORAL_CAPSULE | Freq: Every day | ORAL | 3 refills | Status: AC

## 2016-06-14 MED ORDER — INFLUENZA VAC SPLIT QUAD 0.5 ML IM SUSY
PREFILLED_SYRINGE | INTRAMUSCULAR | Status: AC
  Filled 2016-06-14: qty 0.5

## 2016-06-14 NOTE — Patient Instructions (Signed)
[place] at [facility] Discharge Instructions  RECOMMENDATIONS MADE BY THE CONSULTANT AND ANY TEST RESULTS WILL BE SENT TO YOUR REFERRING PHYSICIAN.  Received Prolia and Influenza injections today. Follow-up as scheduled. Call clinic for any questions or concerns  Thank you for choosing [place] at [facility] to provide your oncology and hematology care.  To afford each patient quality time with our provider, please arrive at least 15 minutes before your scheduled appointment time.   Beginning January 23rd 2017 lab work for the [facility] will be done in the  Main lab at [facility] on 1st floor. If you have a lab appointment with the [place] [facility] please come in thru the  Main Entrance and check in at the main information desk  You need to re-schedule your appointment should you arrive 10 or more minutes late.  We strive to give you quality time with our providers, and arriving late affects you and other patients whose appointments are after yours.  Also, if you no show three or more times for appointments you may be dismissed from the clinic at the providers discretion.     Again, thank you for choosing [facility].  Our hope is that these requests will decrease the amount of time that you wait before being seen by our physicians.       _____________________________________________________________  Should you have questions after your visit to [facility], please contact our office at [phone] between the hours of 8:30 a.m. and 4:30 p.m.  Voicemails left after 4:30 p.m. will not be returned until the following business day.  For prescription refill requests, have your pharmacy contact our office.         Resources For Cancer Patients and their Caregivers ? American Cancer Society: Can assist with transportation, wigs, general needs, runs Look Good Feel Better.        252-844-8336 ? Cancer  Care: Provides financial assistance, online support groups, medication/co-pay assistance.  1-800-813-HOPE 980-119-6613) ? [place] Assists [place] cancer patients and their families through emotional , educational and financial support.  713 279 9557 ? [place] DSS Where to apply for food stamps, Medicaid and utility assistance. 607 181 3650 ? [employer]: Transportation to medical appointments. 249 118 6561 ? Social Security Administration: May apply for disability if have a Stage IV cancer. 5024667660 418-509-7863 ? [employer], Disability and Transit Services: Assists with nutrition, care and transit needs. [place] Support Programs: @10RELATIVEDAYS @ > Cancer Support Group  2nd Tuesday of the month 1pm-2pm, Journey Room  > Creative Journey  3rd Tuesday of the month 1130am-1pm, Journey Room  > Look Good Feel Better  1st Wednesday of the month 10am-12 noon, Journey Room (Call [place] to register [phone])

## 2016-06-14 NOTE — Patient Instructions (Signed)
[place]arly [name] at [facility] Discharge Instructions  RECOMMENDATIONS MADE BY THE CONSULTANT AND ANY TEST RESULTS WILL BE SENT TO YOUR REFERRING PHYSICIAN.  You saw Mike Craze, NP, today See Amy at checkout for appointments.  Thank you for choosing [place] at [facility] to provide your oncology and hematology care.  To afford each patient quality time with our provider, please arrive at least 15 minutes before your scheduled appointment time.   Beginning January 23rd 2017 lab work for the [facility] will be done in the  Main lab at [facility] on 1st floor. If you have a lab appointment with the [facility] please come in thru the  Main Entrance and check in at the main information desk  You need to re-schedule your appointment should you arrive 10 or more minutes late.  We strive to give you quality time with our providers, and arriving late affects you and other patients whose appointments are after yours.  Also, if you no show three or more times for appointments you may be dismissed from the clinic at the providers discretion.     Again, thank you for choosing [facility].  Our hope is that these requests will decrease the amount of time that you wait before being seen by our physicians.       _____________________________________________________________  Should you have questions after your visit to [facility], please contact our office at [phone] between the hours of 8:30 a.m. and 4:30 p.m.  Voicemails left after 4:30 p.m. will not be returned until the following business day.  For prescription refill requests, have your pharmacy contact our office.         Resources For Cancer Patients and their Caregivers ? American Cancer Society: Can assist with transportation, wigs, general needs, runs Look Good Feel Better.        478-884-3334 ? Cancer Care: Provides financial assistance, online  support groups, medication/co-pay assistance.  1-800-813-HOPE (563)799-4847) ? [place] Assists [place] cancer patients and their families through emotional , educational and financial support.  417-780-4441 ? [place] DSS Where to apply for food stamps, Medicaid and utility assistance. 8027684014 ? RCATS: Transportation to medical appointments. 573-311-6489 ? Social Security Administration: May apply for disability if have a Stage IV cancer. 684-820-6936 (931)542-8890 ? [employer], Disability and Transit Services: Assists with nutrition, care and transit needs. [place] Support Programs: @10RELATIVEDAYS @ > Cancer Support Group  2nd Tuesday of the month 1pm-2pm, Journey Room  > Creative Journey  3rd Tuesday of the month 1130am-1pm, Journey Room  > Look Good Feel Better  1st Wednesday of the month 10am-12 noon, Journey Room (Call [place] to register [phone])

## 2016-06-14 NOTE — Addendum Note (Signed)
Addended by: Holley Bouche on: 06/14/2016 06:08 PM   Modules accepted: Orders

## 2016-06-14 NOTE — Progress Notes (Signed)
Carly [name] tolerated Prolia and Influenza injections well without complaints or incident.Labs reviewed prior to administering Prolia. Pt instructed in purpose and side effects of Prolia and permit signed. Pt given drug sheet on Prolia and verbalized understanding. Pt discharged self ambulatory in satisfactory condition
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
## 2016-06-21 ENCOUNTER — Telehealth (HOSPITAL_COMMUNITY): Payer: Self-pay | Admitting: *Deleted

## 2016-06-21 NOTE — Telephone Encounter (Signed)
Called disability company and had them fax new forms to re-fill out. Filled out new forms and faxed to disability company with most recent office note. Notified patient of above and she verbalized understanding.

## 2016-06-26 ENCOUNTER — Other Ambulatory Visit (HOSPITAL_COMMUNITY): Payer: Self-pay | Admitting: Oncology

## 2016-06-26 DIAGNOSIS — T451X5A Adverse effect of antineoplastic and immunosuppressive drugs, initial encounter: Secondary | ICD-10-CM

## 2016-06-26 DIAGNOSIS — G620 Drug-induced polyneuropathy: Secondary | ICD-10-CM

## 2016-06-26 DIAGNOSIS — C50911 Malignant neoplasm of unspecified site of right female breast: Secondary | ICD-10-CM

## 2016-06-26 MED ORDER — HYDROCODONE-ACETAMINOPHEN 10-325 MG PO TABS: 1.0000 | ORAL_TABLET | ORAL | 0 refills | Status: DC | PRN

## 2016-06-26 MED ORDER — GABAPENTIN 300 MG PO CAPS: ORAL_CAPSULE | ORAL | 3 refills | Status: DC

## 2016-07-05 ENCOUNTER — Other Ambulatory Visit (HOSPITAL_COMMUNITY): Payer: Self-pay | Admitting: Oncology

## 2016-07-05 DIAGNOSIS — C50911 Malignant neoplasm of unspecified site of right female breast: Secondary | ICD-10-CM

## 2016-07-05 MED ORDER — ALPRAZOLAM 0.25 MG PO TABS: 0.2500 mg | ORAL_TABLET | Freq: Three times a day (TID) | ORAL | 2 refills | Status: DC | PRN

## 2016-07-12 ENCOUNTER — Other Ambulatory Visit (HOSPITAL_COMMUNITY): Payer: Self-pay | Admitting: Oncology

## 2016-07-12 DIAGNOSIS — G620 Drug-induced polyneuropathy: Secondary | ICD-10-CM

## 2016-07-12 DIAGNOSIS — T451X5A Adverse effect of antineoplastic and immunosuppressive drugs, initial encounter: Secondary | ICD-10-CM

## 2016-07-12 MED ORDER — HYDROCODONE-ACETAMINOPHEN 10-325 MG PO TABS: 1.0000 | ORAL_TABLET | ORAL | 0 refills | Status: DC | PRN

## 2016-07-21 ENCOUNTER — Encounter (HOSPITAL_COMMUNITY): Payer: Self-pay | Admitting: Hematology & Oncology

## 2016-07-27 ENCOUNTER — Other Ambulatory Visit (HOSPITAL_COMMUNITY): Payer: Self-pay | Admitting: Oncology

## 2016-07-27 DIAGNOSIS — G620 Drug-induced polyneuropathy: Secondary | ICD-10-CM

## 2016-07-27 DIAGNOSIS — T451X5A Adverse effect of antineoplastic and immunosuppressive drugs, initial encounter: Secondary | ICD-10-CM

## 2016-07-27 MED ORDER — HYDROCODONE-ACETAMINOPHEN 10-325 MG PO TABS: 1.0000 | ORAL_TABLET | ORAL | 0 refills | Status: DC | PRN

## 2016-08-13 ENCOUNTER — Other Ambulatory Visit (HOSPITAL_COMMUNITY): Payer: Self-pay | Admitting: Oncology

## 2016-08-13 DIAGNOSIS — T451X5A Adverse effect of antineoplastic and immunosuppressive drugs, initial encounter: Secondary | ICD-10-CM

## 2016-08-13 DIAGNOSIS — G620 Drug-induced polyneuropathy: Secondary | ICD-10-CM

## 2016-08-13 MED ORDER — HYDROCODONE-ACETAMINOPHEN 10-325 MG PO TABS: 1.0000 | ORAL_TABLET | ORAL | 0 refills | Status: DC | PRN

## 2016-08-28 ENCOUNTER — Other Ambulatory Visit (HOSPITAL_COMMUNITY): Payer: Self-pay | Admitting: Oncology

## 2016-08-28 DIAGNOSIS — G620 Drug-induced polyneuropathy: Secondary | ICD-10-CM

## 2016-08-28 DIAGNOSIS — T451X5A Adverse effect of antineoplastic and immunosuppressive drugs, initial encounter: Secondary | ICD-10-CM

## 2016-08-28 MED ORDER — HYDROCODONE-ACETAMINOPHEN 10-325 MG PO TABS: 1.0000 | ORAL_TABLET | ORAL | 0 refills | Status: DC | PRN

## 2016-09-12 ENCOUNTER — Encounter (HOSPITAL_COMMUNITY): Payer: Self-pay | Admitting: Hematology

## 2016-09-12 ENCOUNTER — Encounter (HOSPITAL_COMMUNITY): Payer: Medicaid Other | Attending: Hematology | Admitting: Hematology

## 2016-09-12 ENCOUNTER — Encounter (HOSPITAL_COMMUNITY): Payer: Medicaid Other

## 2016-09-12 VITALS — BP 140/83 | HR 87 | Temp 98.1°F | Resp 20 | Wt 279.2 lb

## 2016-09-12 DIAGNOSIS — C50511 Malignant neoplasm of lower-outer quadrant of right female breast: Secondary | ICD-10-CM | POA: Diagnosis not present

## 2016-09-12 DIAGNOSIS — D701 Agranulocytosis secondary to cancer chemotherapy: Secondary | ICD-10-CM

## 2016-09-12 DIAGNOSIS — T451X5A Adverse effect of antineoplastic and immunosuppressive drugs, initial encounter: Secondary | ICD-10-CM | POA: Diagnosis not present

## 2016-09-12 DIAGNOSIS — Z1502 Genetic susceptibility to malignant neoplasm of ovary: Secondary | ICD-10-CM | POA: Diagnosis not present

## 2016-09-12 DIAGNOSIS — Z1509 Genetic susceptibility to other malignant neoplasm: Secondary | ICD-10-CM

## 2016-09-12 DIAGNOSIS — Z1501 Genetic susceptibility to malignant neoplasm of breast: Secondary | ICD-10-CM | POA: Diagnosis not present

## 2016-09-12 DIAGNOSIS — C50911 Malignant neoplasm of unspecified site of right female breast: Secondary | ICD-10-CM

## 2016-09-12 LAB — CBC WITH DIFFERENTIAL/PLATELET
BASOS ABS: 0 10*3/uL (ref 0.0–0.1)
BASOS PCT: 0 %
EOS ABS: 0.1 10*3/uL (ref 0.0–0.7)
EOS PCT: 2 %
HCT: 37.1 % (ref 36.0–46.0)
Hemoglobin: 12.8 g/dL (ref 12.0–15.0)
Lymphocytes Relative: 33 %
Lymphs Abs: 1.3 10*3/uL (ref 0.7–4.0)
MCH: 31.1 pg (ref 26.0–34.0)
MCHC: 34.5 g/dL (ref 30.0–36.0)
MCV: 90.3 fL (ref 78.0–100.0)
Monocytes Absolute: 0.4 10*3/uL (ref 0.1–1.0)
Monocytes Relative: 10 %
Neutro Abs: 2.2 10*3/uL (ref 1.7–7.7)
Neutrophils Relative %: 55 %
Platelets: 130 10*3/uL — ABNORMAL LOW (ref 150–400)
RBC: 4.11 MIL/uL (ref 3.87–5.11)
RDW: 13.4 % (ref 11.5–15.5)
WBC: 3.9 10*3/uL — AB (ref 4.0–10.5)

## 2016-09-12 LAB — COMPREHENSIVE METABOLIC PANEL
ALT: 18 U/L (ref 14–54)
AST: 21 U/L (ref 15–41)
Albumin: 4 g/dL (ref 3.5–5.0)
Alkaline Phosphatase: 47 U/L (ref 38–126)
Anion gap: 6 (ref 5–15)
BUN: 10 mg/dL (ref 6–20)
CHLORIDE: 104 mmol/L (ref 101–111)
CO2: 27 mmol/L (ref 22–32)
CREATININE: 0.69 mg/dL (ref 0.44–1.00)
Calcium: 9 mg/dL (ref 8.9–10.3)
GFR calc Af Amer: >60 mL/min (ref >60)
GFR calc non Af Amer: >60 mL/min (ref >60)
Glucose, Bld: 100 mg/dL — ABNORMAL HIGH (ref 65–99)
POTASSIUM: 3.8 mmol/L (ref 3.5–5.1)
SODIUM: 137 mmol/L (ref 135–145)
Total Bilirubin: 0.7 mg/dL (ref 0.3–1.2)
Total Protein: 7.4 g/dL (ref 6.5–8.1)

## 2016-09-12 MED ORDER — HYDROMORPHONE HCL 2 MG PO TABS: 2.0000 mg | ORAL_TABLET | ORAL | 0 refills | Status: DC | PRN

## 2016-09-12 NOTE — Patient Instructions (Addendum)
[place] at [facility] Discharge Instructions  RECOMMENDATIONS MADE BY THE CONSULTANT AND ANY TEST RESULTS WILL BE SENT TO YOUR REFERRING PHYSICIAN.  You were seen today by Dr. Irene Limbo Follow up in 6 weeks We have given you a prescription for Dilaudid 2mg  let us know if this works See Amy up front for appointments   Thank you for choosing [place] at [facility] to provide your oncology and hematology care.  To afford each patient quality time with our provider, please arrive at least 15 minutes before your scheduled appointment time.    If you have a lab appointment with the [facility] please come in thru the  Main Entrance and check in at the main information desk  You need to re-schedule your appointment should you arrive 10 or more minutes late.  We strive to give you quality time with our providers, and arriving late affects you and other patients whose appointments are after yours.  Also, if you no show three or more times for appointments you may be dismissed from the clinic at the providers discretion.     Again, thank you for choosing [facility].  Our hope is that these requests will decrease the amount of time that you wait before being seen by our physicians.       _____________________________________________________________  Should you have questions after your visit to [facility], please contact our office at [phone] between the hours of 8:30 a.m. and 4:30 p.m.  Voicemails left after 4:30 p.m. will not be returned until the following business day.  For prescription refill requests, have your pharmacy contact our office.       Resources For Cancer Patients and their Caregivers ? American Cancer Society: Can assist with transportation, wigs, general needs, runs Look Good Feel Better.        (989)736-6150 ? Cancer Care: Provides financial assistance, online support groups,  medication/co-pay assistance.  1-800-813-HOPE (786)187-8094) ? [place] Assists [place] cancer patients and their families through emotional , educational and financial support.  682-523-0103 ? [place] DSS Where to apply for food stamps, Medicaid and utility assistance. 5146115122 ? [facility]: Transportation to medical appointments. 586-844-7480 ? Social Security Administration: May apply for disability if have a Stage IV cancer. (219)615-2244 754-629-2510 ? [employer], Disability and Transit Services: Assists with nutrition, care and transit needs. [place] Support Programs: @10RELATIVEDAYS @ > Cancer Support Group  2nd Tuesday of the month 1pm-2pm, Journey Room  > Creative Journey  3rd Tuesday of the month 1130am-1pm, Journey Room  > Look Good Feel Better  1st Wednesday of the month 10am-12 noon, Journey Room (Call [place] to register [phone])

## 2016-09-12 NOTE — Progress Notes (Signed)
Carly [name]  HEMATOLOGY ONCOLOGY PROGRESS NOTE  Date of service: .09/12/2016  Patient Care Team: [name], MD as PCP - General (Internal Medicine)  CC: Follow-up for BRCA1 positive breast cancer  SUMMARY OF ONCOLOGIC HISTORY:   Recurrent breast cancer (Carly [name])   05/06/2015 Initial Biopsy    Incisional biopsy with Dr. Maryclare Labrador of palpable mass, lower outer quadrant of reconstructed R breast close to nipple ER+ 97%, PR+ 70 (weak) HER 2 - by [facility]      07/06/2015 Surgery    Definitive lumpectomy and LN dissection performed revealing a 3.1 cm primary 1/14 positive LN with a macro metastases, satellite nodules adjacent to the primary, LVI-, however she had skin invasion along with satellite nodules, T4b N1a, Stage IIIB, grade IIII,      07/07/2015 Imaging    Per records negative CT C/A/P and bone scan      08/15/2015 - 11/22/2015 Chemotherapy    AC x 4, Taxol X 4 both given dose dense       - 01/27/2016 Radiation Therapy    Dr. Harlow Ohms. Pablo Ledger      02/06/2016 Imaging    Bone density- BMD as determined from AP Spine L1-L4 is 1.021 g/cm2 with a T-Score of -1.3. This patient is considered osteopenic according to [place] [facility]) criteria.        INTERVAL HISTORY:  Carly [name] 36 y.o. female returns for followup of BRCA1+ right breast cancer ([id number]), Stage IIIB, s/p lumpectomy, followed by adjuvant chemo with AC x 4 followed by Taxol x 4 in dose dense fashion followed by XRT finishing on 01/27/2016. Patient has been on femara since then. She notes that she is tolerating it well generally but has generalized body aches. Also notes some night sweats. Is taking a fair amount of her pain medication. Has complex pain history and was being followed by pain management previously.  Has some chronic right-sided lymphedema and went through 3 physical rehabilitation sessions and is continuing to do this at home.  Has neuropathy symptoms related to her chemotherapy for which she is on  Cymbalta and gabapentin and opiates. Does not feel like it and is working and wants to switch to something without acetaminophen since she is concerned about the amount of acetaminophen she is using. We discussed options and switched her to Dilaudid. However the patient came back a few days later after using large amounts of Dilaudid and requesting some other medication. She was evaluated by a different provider and placed back on her previous medication and recommended evaluation by pain management specialist given her complex pain management needs.  REVIEW OF SYSTEMS:    10 Point review of systems of done and is negative except as noted above.  . Past Medical History:  Diagnosis Date  . Breast cancer ([place]) 2013   BRCA1 positive    . Past Surgical History:  Procedure Laterality Date  . ABDOMINAL HYSTERECTOMY    . CESAREAN SECTION    . MASTECTOMY      . Social History  Substance Use Topics  . Smoking status: Never Smoker  . Smokeless tobacco: Never Used  . Alcohol use No    ALLERGIES:  is allergic to oxycodone-acetaminophen and sumatriptan.  MEDICATIONS:  Current Outpatient Prescriptions  Medication Sig Dispense Refill  . ALPRAZolam (XANAX) 0.25 MG tablet Take 1 tablet (0.25 mg total) by mouth 3 (three) times daily as needed for anxiety. 50 tablet 2  . cyclobenzaprine (FLEXERIL) 5 MG tablet Take by mouth.    Carly [name]  docusate sodium (COLACE) 100 MG capsule Take by mouth.    . DULoxetine (CYMBALTA) 60 MG capsule Take 1 capsule (60 mg total) by mouth daily. 90 capsule 3  . gabapentin (NEURONTIN) 300 MG capsule TAKE TWO (2) CAPSULES BY MOUTH THREE TIMES DAILY 180 capsule 3  . HYDROcodone-acetaminophen (NORCO) 10-325 MG tablet Take 1 tablet by mouth every 4 (four) hours as needed. 100 tablet 0  . ibuprofen (ADVIL,MOTRIN) 200 MG tablet Take by mouth.    . letrozole (FEMARA) 2.5 MG tablet Take 1 tablet (2.5 mg total) by mouth daily. 30 tablet 6  . ondansetron (ZOFRAN) 4 MG tablet TAKE  ONE TABLET BY MOUTH EVERY 6 HOURS AS NEEDED FOR NAUSEA    . pantoprazole (PROTONIX) 40 MG tablet Take 1 tablet (40 mg total) by mouth daily. 90 tablet 3  . prochlorperazine (COMPAZINE) 10 MG tablet Take by mouth.    . promethazine (PHENERGAN) 12.5 MG tablet Take by mouth.    . triamterene-hydrochlorothiazide (MAXZIDE-25) 37.5-25 MG tablet Take one tab daily as needed for swelling.     No current facility-administered medications for this visit.     PHYSICAL EXAMINATION: ECOG PERFORMANCE STATUS: 1 - Symptomatic but completely ambulatory  . Vitals:   09/12/16 1213  BP: 140/83  Pulse: 87  Resp: 20  Temp: 98.1 F (36.7 C)    Filed Weights   09/12/16 1213  Weight: 279 lb 3.2 oz (126.6 kg)   .Body mass index is 43.73 kg/m.  GENERAL:alert, in no acute distress and comfortable SKIN: no acute rashes, no significant lesions EYES: conjunctiva are pink and non-injected, sclera anicteric OROPHARYNX: MMM, no exudates, no oropharyngeal erythema or ulceration NECK: supple, no JVD LYMPH:  no palpable lymphadenopathy in the cervical, axillary or inguinal regions LUNGS: clear to auscultation b/l with normal respiratory effort HEART: regular rate & rhythm ABDOMEN:  normoactive bowel sounds , non tender, not distended. Extremity: no pedal edema PSYCH: alert & oriented x 3 with fluent speech NEURO: no focal motor/sensory deficits  LABORATORY DATA:   I have reviewed the data as listed  . CBC Latest Ref Rng & Units 09/12/2016 06/14/2016 05/15/2016  WBC 4.0 - 10.5 K/uL 3.9(L) 4.1 4.5  Hemoglobin 12.0 - 15.0 g/dL 12.8 12.6 11.9(L)  Hematocrit 36.0 - 46.0 % 37.1 37.2 35.5(L)  Platelets 150 - 400 K/uL 130(L) 128(L) 145(L)    . CMP Latest Ref Rng & Units 09/12/2016 06/14/2016 05/15/2016  Glucose 65 - 99 mg/dL 100(H) 85 95  BUN 6 - 20 mg/dL '10 8 10  ' Creatinine 0.44 - 1.00 mg/dL 0.69 0.68 0.67  Sodium 135 - 145 mmol/L 137 138 138  Potassium 3.5 - 5.1 mmol/L 3.8 3.8 3.9  Chloride 101 - 111  mmol/L 104 103 104  CO2 22 - 32 mmol/L '27 28 28  ' Calcium 8.9 - 10.3 mg/dL 9.0 9.3 9.1  Total Protein 6.5 - 8.1 g/dL 7.4 7.3 7.4  Total Bilirubin 0.3 - 1.2 mg/dL 0.7 0.7 0.6  Alkaline Phos 38 - 126 U/L 47 71 72  AST 15 - 41 U/L '21 25 20  ' ALT 14 - 54 U/L '18 27 25     ' RADIOGRAPHIC STUDIES: I have personally reviewed the radiological images as listed and agreed with the findings in the report. No results found.  ASSESSMENT & PLAN:   Recurrent breast cancer ([place]) BRCA1+, Stage III invasive adenocarcinoma right breast, ER/PR+ HER2 NEGATIVE, diagnosed in 2016 treated with definitive lumpectomy and LN dissection on 07/06/2015 followed by [facility] x 4  then T x 4 in dose dense fashion (08/15/2015- 11/22/2015) by Dr. Tressie Stalker, followed by XRT finishing on 01/27/2016. She was prescribed Femara. AND Stage I DCIS BRCA+ diagnosed in 2013; double mastectomy in [place], reconstruction with Dr. Maryclare Labrador AND Bilateral mastectomy, Hysterectomy/oophorectomy HX DVT BRCA1 positivity Chemotherapy induced neuropathy PLAN -No clinical evidence of breast cancer recurrence at this time. -Labs stable -Continue femara  Osteopenia  -DEXA scan on 02/06/16 revealed osteopenia with T-score -1.3. High risk for adverse skeletal outcomes given ongoing Temodar treatment and status post premenopausal oophorectomy. Plan -continue q33monthprolia  Port-a-cath issues:  -She is not interested in having her port revised at this time.    Peripheral neuropathy and leg pain/weakness:  Also has knee arthritis in the setting of obesity.  -Continue Cymbalta and Neurontin.   -vicodin was switched to Dilaudid at equianalgesic doses since patient was keen not to use too much acetaminophen. This did not work for her and management and the patient is back on her previous medication after reevaluation by an different care provider a few days later. -Would need a pain management specialist evaluation and management like she had  previously due to complex pain management considerations. There also has a significant element of emotional overlay.   Return to clinic in 6 weeks for reevaluation of her symptom management.  I spent 25 minutes counseling the patient face to face. The total time spent in the appointment was 30 minutes and more than 50% was on counseling and direct patient cares.    GSullivan LoneMD MNewlandAAHIVMS SMills-Peninsula Medical CenterCBig Bend Regional Medical CenterHematology/Oncology Physician [facility] (Office):       [phone](Work cell):  [phone](Fax):           [phone]

## 2016-09-14 ENCOUNTER — Telehealth (HOSPITAL_COMMUNITY): Payer: Self-pay

## 2016-09-14 DIAGNOSIS — T451X5A Adverse effect of antineoplastic and immunosuppressive drugs, initial encounter: Principal | ICD-10-CM

## 2016-09-14 DIAGNOSIS — G620 Drug-induced polyneuropathy: Secondary | ICD-10-CM

## 2016-09-14 MED ORDER — HYDROCODONE-ACETAMINOPHEN 10-325 MG PO TABS: 1.0000 | ORAL_TABLET | ORAL | 0 refills | Status: DC | PRN

## 2016-09-14 NOTE — Telephone Encounter (Signed)
Patient called stating the Dilaudid was not helping her pain. She states she has had a terrible headache for 2 days and even took 4 of the dilaudid for some relief. She wants to switch back to her Norco 10/325. Reviewed with MD and PA (Dr. Irene Limbo and Gershon Mussel). They said for patient to bring back her bottle of Dilaudid and then she can have a prescription for Hydrocodone 10/325 and will be referred to pain clinic.Patient made aware and verbalized understanding. She states she will bring bottle today so she can get new scrip.

## 2016-09-27 ENCOUNTER — Other Ambulatory Visit (HOSPITAL_COMMUNITY): Payer: Self-pay | Admitting: Adult Health

## 2016-09-27 ENCOUNTER — Encounter (HOSPITAL_COMMUNITY): Payer: Self-pay | Admitting: Adult Health

## 2016-09-27 DIAGNOSIS — G620 Drug-induced polyneuropathy: Secondary | ICD-10-CM

## 2016-09-27 DIAGNOSIS — T451X5A Adverse effect of antineoplastic and immunosuppressive drugs, initial encounter: Principal | ICD-10-CM

## 2016-09-27 MED ORDER — HYDROCODONE-ACETAMINOPHEN 10-325 MG PO TABS: 1.0000 | ORAL_TABLET | ORAL | 0 refills | Status: DC | PRN

## 2016-09-27 NOTE — Progress Notes (Addendum)
Patient called cancer center requesting refill of Norco.   [place] Controlled Substance Registry reviewed and refill is appropriate on or after 10/01/16. She was given #100 pills on 09/14/16.  Referral to pain clinic placed, as she has very complex pain management needs.  I have asked our scheduler and triage nurse to follow-up on this referral ASAP.   Paper prescription printed and left at cancer center front desk for patient to retrieve after showing photo ID per clinic policy.   [place] reviewed:     Mike Craze, NP [place] [phone]

## 2016-10-09 ENCOUNTER — Other Ambulatory Visit (HOSPITAL_COMMUNITY): Payer: Self-pay | Admitting: Hematology & Oncology

## 2016-10-09 DIAGNOSIS — C50911 Malignant neoplasm of unspecified site of right female breast: Secondary | ICD-10-CM

## 2016-10-17 ENCOUNTER — Encounter (HOSPITAL_COMMUNITY): Payer: Self-pay | Admitting: Adult Health

## 2016-10-17 ENCOUNTER — Other Ambulatory Visit (HOSPITAL_COMMUNITY): Payer: Self-pay | Admitting: Adult Health

## 2016-10-17 DIAGNOSIS — G620 Drug-induced polyneuropathy: Secondary | ICD-10-CM

## 2016-10-17 DIAGNOSIS — T451X5A Adverse effect of antineoplastic and immunosuppressive drugs, initial encounter: Principal | ICD-10-CM

## 2016-10-17 MED ORDER — HYDROCODONE-ACETAMINOPHEN 10-325 MG PO TABS: 1.0000 | ORAL_TABLET | ORAL | 0 refills | Status: DC | PRN

## 2016-10-17 NOTE — Progress Notes (Signed)
Patient called cancer center requesting refill of Norco.   [place] Controlled Substance Registry reviewed and refill is appropriate on or after 10/18/16. Paper prescription printed and left at cancer center front desk for patient to retrieve after showing photo ID per clinic policy.   [place] reviewed:     Mike Craze, NP [place] [phone]

## 2016-10-18 ENCOUNTER — Encounter (HOSPITAL_COMMUNITY): Payer: Self-pay | Admitting: Lab

## 2016-10-18 NOTE — Progress Notes (Unsigned)
Referral sent to [facility] / Dr Maryjean Ka for pain management.  Records faxed on 4/26

## 2016-10-25 ENCOUNTER — Encounter (HOSPITAL_COMMUNITY): Payer: Self-pay

## 2016-10-25 ENCOUNTER — Ambulatory Visit (HOSPITAL_COMMUNITY): Payer: Medicaid Other

## 2016-10-25 ENCOUNTER — Encounter (HOSPITAL_COMMUNITY): Payer: Medicaid Other

## 2016-10-25 ENCOUNTER — Encounter (HOSPITAL_COMMUNITY): Payer: Medicaid Other | Attending: Oncology | Admitting: Oncology

## 2016-10-25 ENCOUNTER — Encounter (HOSPITAL_COMMUNITY): Payer: Medicaid Other | Attending: Hematology

## 2016-10-25 VITALS — BP 130/65 | HR 90 | Temp 98.2°F | Resp 20 | Wt 280.8 lb

## 2016-10-25 DIAGNOSIS — G609 Hereditary and idiopathic neuropathy, unspecified: Secondary | ICD-10-CM | POA: Diagnosis not present

## 2016-10-25 DIAGNOSIS — C50511 Malignant neoplasm of lower-outer quadrant of right female breast: Secondary | ICD-10-CM | POA: Diagnosis present

## 2016-10-25 DIAGNOSIS — C50919 Malignant neoplasm of unspecified site of unspecified female breast: Secondary | ICD-10-CM

## 2016-10-25 DIAGNOSIS — Z1509 Genetic susceptibility to other malignant neoplasm: Secondary | ICD-10-CM

## 2016-10-25 DIAGNOSIS — I890 Lymphedema, not elsewhere classified: Secondary | ICD-10-CM | POA: Diagnosis not present

## 2016-10-25 DIAGNOSIS — C50911 Malignant neoplasm of unspecified site of right female breast: Secondary | ICD-10-CM | POA: Insufficient documentation

## 2016-10-25 DIAGNOSIS — D701 Agranulocytosis secondary to cancer chemotherapy: Secondary | ICD-10-CM | POA: Insufficient documentation

## 2016-10-25 DIAGNOSIS — T451X5A Adverse effect of antineoplastic and immunosuppressive drugs, initial encounter: Secondary | ICD-10-CM | POA: Insufficient documentation

## 2016-10-25 DIAGNOSIS — Z1502 Genetic susceptibility to malignant neoplasm of ovary: Secondary | ICD-10-CM | POA: Insufficient documentation

## 2016-10-25 DIAGNOSIS — Z1501 Genetic susceptibility to malignant neoplasm of breast: Secondary | ICD-10-CM | POA: Insufficient documentation

## 2016-10-25 NOTE — Patient Instructions (Addendum)
[place] at [facility] Discharge Instructions  RECOMMENDATIONS MADE BY THE CONSULTANT AND ANY TEST RESULTS WILL BE SENT TO YOUR REFERRING PHYSICIAN.  You were seen today by Dr. Twana First Prolia injection next month Follow up in 3 months with lab work  Thank you for choosing [place] at [facility] to provide your oncology and hematology care.  To afford each patient quality time with our provider, please arrive at least 15 minutes before your scheduled appointment time.    If you have a lab appointment with the [place] please come in thru the  Main Entrance and check in at the main information desk  You need to re-schedule your appointment should you arrive 10 or more minutes late.  We strive to give you quality time with our providers, and arriving late affects you and other patients whose appointments are after yours.  Also, if you no show three or more times for appointments you may be dismissed from the clinic at the providers discretion.     Again, thank you for choosing [facility].  Our hope is that these requests will decrease the amount of time that you wait before being seen by our physicians.       _____________________________________________________________  Should you have questions after your visit to [facility], please contact our office at [phone] between the hours of 8:30 a.m. and 4:30 p.m.  Voicemails left after 4:30 p.m. will not be returned until the following business day.  For prescription refill requests, have your pharmacy contact our office.       Resources For Cancer Patients and their Caregivers ? American Cancer Society: Can assist with transportation, wigs, general needs, runs Look Good Feel Better.        310-870-6706 ? Cancer Care: Provides financial assistance, online support groups, medication/co-pay assistance.  1-800-813-HOPE 765-209-1648) ? [place] Assists [place] cancer patients and their families through emotional , educational and financial support.  (606)171-7880 ? [place] DSS Where to apply for food stamps, Medicaid and utility assistance. (610) 293-2590 ? [facility]: Transportation to medical appointments. 873-611-5458 ? Social Security Administration: May apply for disability if have a Stage IV cancer. 914 108 2252 (919) 643-7767 ? [employer], Disability and Transit Services: Assists with nutrition, care and transit needs. [place] Support Programs: @10RELATIVEDAYS @ > Cancer Support Group  2nd Tuesday of the month 1pm-2pm, Journey Room  > Creative Journey  3rd Tuesday of the month 1130am-1pm, Journey Room  > Look Good Feel Better  1st Wednesday of the month 10am-12 noon, Journey Room (Call [place] to register [phone])

## 2016-10-25 NOTE — Progress Notes (Signed)
[facility]  HEMATOLOGY ONCOLOGY PROGRESS NOTE  Date of service: .09/12/2016  Patient Care Team: [name], MD as PCP - General (Internal Medicine)  CC: Follow-up for BRCA1 positive breast cancer  SUMMARY OF ONCOLOGIC HISTORY:   Recurrent breast cancer ([place])   05/06/2015 Initial Biopsy    Incisional biopsy with Dr. Maryclare Labrador of palpable mass, lower outer quadrant of reconstructed R breast close to nipple ER+ 97%, PR+ 70 (weak) HER 2 - by [facility]      07/06/2015 Surgery    Definitive lumpectomy and LN dissection performed revealing a 3.1 cm primary 1/14 positive LN with a macro metastases, satellite nodules adjacent to the primary, LVI-, however she had skin invasion along with satellite nodules, T4b N1a, Stage IIIB, grade IIII,      07/07/2015 Imaging    Per records negative CT C/A/P and bone scan      08/15/2015 - 11/22/2015 Chemotherapy    AC x 4, Taxol X 4 both given dose dense       - 01/27/2016 Radiation Therapy    Dr. Harlow Ohms. Pablo Ledger      02/06/2016 Imaging    Bone density- BMD as determined from [facility] L1-L4 is 1.021 g/cm2 with a T-Score of -1.3. This patient is considered osteopenic according to [facility]) criteria.        INTERVAL HISTORY:  Carly [name] 36 y.o. female returns for followup of BRCA1+ right breast cancer ([id number]), Stage IIIB, s/p lumpectomy, followed by adjuvant chemo with AC x 4 followed by Taxol x 4 in dose dense fashion followed by XRT finishing on 01/27/2016. Patient has been on femara since then. She notes that she is tolerating it well generally but has generalized body aches. Also notes some night sweats. Is taking a fair amount of her pain medication. Has complex pain history and was being followed by pain management previously.  Has some chronic right-sided lymphedema and went through 3 physical rehabilitation sessions and is continuing to do this at home.  Carly [name] presents to the clinic for continuing follow up.   She  state she is doing "so-so".  She notes bilateral leg bone pain. She notes she tried to change her pain medicine but it has not helped at all. 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 She is currently on back on Femara. She stopped it for about a week. She continues to take her calcium and vitamin D supplement. She is due for her Prolia in June. She has an upcoming appointment at the pain clinic for her pain management.   She is currently taking Cymbalta and notes it helps with her depression and her neuropathy.  She has chronic lymphedema of her right arm. She currently uses a lymphedema sleeve at home.    REVIEW OF SYSTEMS:     Review of Systems  Constitutional: Negative.   HENT: Negative.   Eyes: Negative.   Respiratory: Negative.   Cardiovascular: Negative.   Gastrointestinal: Negative.   Genitourinary: Negative.   Musculoskeletal: Negative.        Bilateral leg bone pain  Skin: Negative.   Neurological: Positive for tingling (both hands and feet).  Endo/Heme/Allergies: Negative.   All other systems reviewed and are negative.  . Past Medical History:  Diagnosis Date  . Breast cancer ([place]) 2013   BRCA1 positive    . Past Surgical History:  Procedure Laterality Date  . ABDOMINAL HYSTERECTOMY    . CESAREAN SECTION    .  MASTECTOMY      . Social History  Substance Use Topics  . Smoking status: Never Smoker  . Smokeless tobacco: Never Used  . Alcohol use No    ALLERGIES:  is allergic to oxycodone-acetaminophen and sumatriptan.  MEDICATIONS:  Current Outpatient Prescriptions  Medication Sig Dispense Refill  . ALPRAZolam (XANAX) 0.25 MG tablet TAKE ONE TABLET BY MOUTH THREE TIMES DAILY AS NEEDED FOR ANXIETY 50 tablet 1  . cyclobenzaprine (FLEXERIL) 5 MG tablet Take by mouth.    . docusate sodium (COLACE) 100 MG capsule Take by mouth.    . DULoxetine (CYMBALTA) 60 MG capsule Take 1 capsule (60 mg total) by mouth daily. 90 capsule 3  .  gabapentin (NEURONTIN) 300 MG capsule TAKE TWO (2) CAPSULES BY MOUTH THREE TIMES DAILY 180 capsule 3  . HYDROcodone-acetaminophen (NORCO) 10-325 MG tablet Take 1 tablet by mouth every 4 (four) hours as needed. 100 tablet 0  . ibuprofen (ADVIL,MOTRIN) 200 MG tablet Take by mouth.    . letrozole (FEMARA) 2.5 MG tablet Take 1 tablet (2.5 mg total) by mouth daily. 30 tablet 6  . ondansetron (ZOFRAN) 4 MG tablet TAKE ONE TABLET BY MOUTH EVERY 6 HOURS AS NEEDED FOR NAUSEA    . pantoprazole (PROTONIX) 40 MG tablet Take 1 tablet (40 mg total) by mouth daily. 90 tablet 3  . prochlorperazine (COMPAZINE) 10 MG tablet Take by mouth.    . promethazine (PHENERGAN) 12.5 MG tablet Take by mouth.    . ranitidine (ZANTAC) 150 MG capsule Take 150 mg by mouth daily.    Marland Kitchen triamterene-hydrochlorothiazide (MAXZIDE-25) 37.5-25 MG tablet Take one tab daily as needed for swelling.     No current facility-administered medications for this visit.     PHYSICAL EXAMINATION: ECOG PERFORMANCE STATUS: 1 - Symptomatic but completely ambulatory   Vitals:   10/25/16 1347  BP: 130/65  Pulse: 90  Resp: 20  Temp: 98.2 F (36.8 C)   Filed Weights   10/25/16 1347  Weight: 280 lb 12.8 oz (127.4 kg)     Physical Exam  Constitutional: She is oriented to person, place, and time and well-developed, well-nourished, and in no distress.  HENT:  Head: Normocephalic and atraumatic.  Eyes: Conjunctivae and EOM are normal. Pupils are equal, round, and reactive to light.  Neck: Normal range of motion. Neck supple.  Cardiovascular: Normal rate, regular rhythm and normal heart sounds.   Pulmonary/Chest: Effort normal and breath sounds normal.  BREAST: bilateral breast reconstruction. No nodules palpated bilaterally, no skin changes. No axillary lymphadenopathy bilaterally  Abdominal: Soft. Bowel sounds are normal.  Musculoskeletal: Normal range of motion. She exhibits edema (lymphedema of right arm).  Neurological: She is alert  and oriented to person, place, and time. Gait normal.  Skin: Skin is warm and dry.  Nursing note and vitals reviewed.   LABORATORY DATA:   I have reviewed the data as listed  . CBC Latest Ref Rng & Units 09/12/2016 06/14/2016 05/15/2016  WBC 4.0 - 10.5 K/uL 3.9(L) 4.1 4.5  Hemoglobin 12.0 - 15.0 g/dL 12.8 12.6 11.9(L)  Hematocrit 36.0 - 46.0 % 37.1 37.2 35.5(L)  Platelets 150 - 400 K/uL 130(L) 128(L) 145(L)    . CMP Latest Ref Rng & Units 09/12/2016 06/14/2016 05/15/2016  Glucose 65 - 99 mg/dL 100(H) 85 95  BUN 6 - 20 mg/dL '10 8 10  ' Creatinine 0.44 - 1.00 mg/dL 0.69 0.68 0.67  Sodium 135 - 145 mmol/L 137 138 138  Potassium 3.5 - 5.1 mmol/L 3.8  3.8 3.9  Chloride 101 - 111 mmol/L 104 103 104  CO2 22 - 32 mmol/L '27 28 28  ' Calcium 8.9 - 10.3 mg/dL 9.0 9.3 9.1  Total Protein 6.5 - 8.1 g/dL 7.4 7.3 7.4  Total Bilirubin 0.3 - 1.2 mg/dL 0.7 0.7 0.6  Alkaline Phos 38 - 126 U/L 47 71 72  AST 15 - 41 U/L '21 25 20  ' ALT 14 - 54 U/L '18 27 25     ' RADIOGRAPHIC STUDIES: I have personally reviewed the radiological images as listed and agreed with the findings in the report. No results found.  ASSESSMENT & PLAN:   1. Recurrent breast cancer ([place]) BRCA1+, Stage III invasive adenocarcinoma right breast, ER/PR+ HER2 NEGATIVE, diagnosed in 2016 treated with definitive lumpectomy and LN dissection on 07/06/2015 followed by [facility] x 4 then T x 4 in dose dense fashion (08/15/2015- 11/22/2015) by Dr. Tressie Stalker, followed by XRT finishing on 01/27/2016. She was prescribed Femara. AND Stage I DCIS BRCA+ diagnosed in 2013; double mastectomy in [place], reconstruction with Dr. Maryclare Labrador AND Bilateral mastectomy, Hysterectomy/oophorectomy HX DVT BRCA1 positivity Chemotherapy induced neuropathy  PLAN -No evidence of recurrence. Clinically NED on breast exam today. -Continue femara - Encouraged her to do her lymphedema exercises and wear her lymphedema sleeve.  2. Osteopenia  -DEXA scan on 02/06/16 revealed  osteopenia with T-score -1.3. High risk for adverse skeletal outcomes given ongoing Temodar treatment and status post premenopausal oophorectomy. Plan -continue q64monthprolia, next injection in June 2018  3. Peripheral neuropathy and chronic leg pain/weakness:  -Continue Cymbalta and Neurontin.   -Patient has an appointment with pain management clinic on 11/21/16, I strongly recommended for her to follow with them. She is currently on Norco PRN. We will no longer fill her pain meds once she gets established with pain management.   RTC in 3 months for follow up with CBC and CMP. Sooner should she palpate any new breast masses.  This document serves as a record of services personally performed by LTwana First MD. It was created on her behalf by SShirlean Mylar a trained medical scribe. The creation of this record is based on the scribe's personal observations and the provider's statements to them. This document has been checked and approved by the attending provider.  I have reviewed the above documentation for accuracy and completeness and I agree with the above.

## 2016-11-01 ENCOUNTER — Other Ambulatory Visit (HOSPITAL_COMMUNITY): Payer: Self-pay | Admitting: Adult Health

## 2016-11-01 ENCOUNTER — Encounter (HOSPITAL_COMMUNITY): Payer: Self-pay | Admitting: Adult Health

## 2016-11-01 DIAGNOSIS — T451X5A Adverse effect of antineoplastic and immunosuppressive drugs, initial encounter: Principal | ICD-10-CM

## 2016-11-01 DIAGNOSIS — G620 Drug-induced polyneuropathy: Secondary | ICD-10-CM

## 2016-11-01 MED ORDER — HYDROCODONE-ACETAMINOPHEN 10-325 MG PO TABS: 1.0000 | ORAL_TABLET | ORAL | 0 refills | Status: DC | PRN

## 2016-11-01 NOTE — Progress Notes (Addendum)
Patient called cancer center requesting refill of Norco.   [place] Controlled Substance Registry reviewed and refill is appropriate on or after 11/04/16. Paper prescription printed & post-dated; Rx left at cancer center front desk for patient to retrieve after showing photo ID per clinic policy.   OF NOTE: This will be the last pain medication prescription provided for this patient, as she has pending pain management consultation scheduled for 11/21/16 (per Dr. Laverle Patter last visit on 10/25/16).     [place] reviewed:     Mike Craze, NP [place] [phone]

## 2016-11-16 ENCOUNTER — Other Ambulatory Visit (HOSPITAL_COMMUNITY): Payer: Self-pay | Admitting: Adult Health

## 2016-11-16 ENCOUNTER — Encounter (HOSPITAL_COMMUNITY): Payer: Self-pay | Admitting: Adult Health

## 2016-11-16 DIAGNOSIS — T451X5A Adverse effect of antineoplastic and immunosuppressive drugs, initial encounter: Principal | ICD-10-CM

## 2016-11-16 DIAGNOSIS — G620 Drug-induced polyneuropathy: Secondary | ICD-10-CM

## 2016-11-16 MED ORDER — HYDROCODONE-ACETAMINOPHEN 10-325 MG PO TABS: 1.0000 | ORAL_TABLET | ORAL | 0 refills | Status: DC | PRN

## 2016-11-16 NOTE — Progress Notes (Signed)
Patient called requesting refill for Norco.   [name] Controlled Substance Reporting System reviewed.  Based on this report, she will run out of medication on 11/20/16.  She has pain specialist consultation on 11/21/16. Therefore, I will give her very limited supply, #10 pills only; prescription post-dated to be filled on 11/20/16.   She was told this will be the absolute last pain medication prescription from this office, given upcoming pain specialist visit. They will assume the responsibility of her pain management at that time.      Mike Craze, NP [place] [phone]

## 2016-12-12 ENCOUNTER — Encounter (HOSPITAL_BASED_OUTPATIENT_CLINIC_OR_DEPARTMENT_OTHER): Payer: Medicaid Other

## 2016-12-12 ENCOUNTER — Encounter (HOSPITAL_COMMUNITY): Payer: Medicaid Other | Attending: Hematology

## 2016-12-12 VITALS — BP 120/106 | HR 86 | Temp 97.6°F | Resp 18

## 2016-12-12 DIAGNOSIS — M8580 Other specified disorders of bone density and structure, unspecified site: Secondary | ICD-10-CM | POA: Diagnosis present

## 2016-12-12 DIAGNOSIS — T451X5A Adverse effect of antineoplastic and immunosuppressive drugs, initial encounter: Secondary | ICD-10-CM | POA: Insufficient documentation

## 2016-12-12 DIAGNOSIS — D701 Agranulocytosis secondary to cancer chemotherapy: Secondary | ICD-10-CM | POA: Diagnosis present

## 2016-12-12 DIAGNOSIS — Z1501 Genetic susceptibility to malignant neoplasm of breast: Secondary | ICD-10-CM | POA: Diagnosis not present

## 2016-12-12 DIAGNOSIS — C50911 Malignant neoplasm of unspecified site of right female breast: Secondary | ICD-10-CM | POA: Diagnosis not present

## 2016-12-12 DIAGNOSIS — Z79811 Long term (current) use of aromatase inhibitors: Secondary | ICD-10-CM

## 2016-12-12 DIAGNOSIS — Z1502 Genetic susceptibility to malignant neoplasm of ovary: Secondary | ICD-10-CM | POA: Diagnosis not present

## 2016-12-12 LAB — CBC WITH DIFFERENTIAL/PLATELET
BASOS ABS: 0 10*3/uL (ref 0.0–0.1)
Basophils Relative: 0 %
EOS ABS: 0.1 10*3/uL (ref 0.0–0.7)
EOS PCT: 2 %
HCT: 36.8 % (ref 36.0–46.0)
HEMOGLOBIN: 12.6 g/dL (ref 12.0–15.0)
Lymphocytes Relative: 29 %
Lymphs Abs: 1.3 10*3/uL (ref 0.7–4.0)
MCH: 30.2 pg (ref 26.0–34.0)
MCHC: 34.2 g/dL (ref 30.0–36.0)
MCV: 88.2 fL (ref 78.0–100.0)
Monocytes Absolute: 0.4 10*3/uL (ref 0.1–1.0)
Monocytes Relative: 10 %
NEUTROS PCT: 59 %
Neutro Abs: 2.7 10*3/uL (ref 1.7–7.7)
PLATELETS: 129 10*3/uL — AB (ref 150–400)
RBC: 4.17 MIL/uL (ref 3.87–5.11)
RDW: 13 % (ref 11.5–15.5)
WBC: 4.4 10*3/uL (ref 4.0–10.5)

## 2016-12-12 LAB — COMPREHENSIVE METABOLIC PANEL
ALT: 20 U/L (ref 14–54)
AST: 22 U/L (ref 15–41)
Albumin: 4 g/dL (ref 3.5–5.0)
Alkaline Phosphatase: 47 U/L (ref 38–126)
Anion gap: 8 (ref 5–15)
BUN: 10 mg/dL (ref 6–20)
CO2: 26 mmol/L (ref 22–32)
CREATININE: 0.75 mg/dL (ref 0.44–1.00)
Calcium: 9.3 mg/dL (ref 8.9–10.3)
Chloride: 106 mmol/L (ref 101–111)
GFR calc non Af Amer: >60 mL/min (ref >60)
GLUCOSE: 92 mg/dL (ref 65–99)
Potassium: 3.9 mmol/L (ref 3.5–5.1)
SODIUM: 140 mmol/L (ref 135–145)
Total Bilirubin: 0.8 mg/dL (ref 0.3–1.2)
Total Protein: 7.4 g/dL (ref 6.5–8.1)

## 2016-12-12 MED ORDER — DENOSUMAB 60 MG/ML ~~LOC~~ SOLN
60.0000 mg | Freq: Once | SUBCUTANEOUS | Status: AC
  Administered 2016-12-12: 60 mg via SUBCUTANEOUS
  Filled 2016-12-12: qty 1

## 2016-12-12 NOTE — Progress Notes (Signed)
Carly [name] presents today for injection per MD orders. Prolia 60mg  administered SQ in left Abdomen. Administration without incident. Patient tolerated well.

## 2016-12-13 ENCOUNTER — Other Ambulatory Visit (HOSPITAL_COMMUNITY): Payer: Medicaid Other

## 2016-12-13 ENCOUNTER — Ambulatory Visit (HOSPITAL_COMMUNITY): Payer: Medicaid Other

## 2016-12-19 ENCOUNTER — Other Ambulatory Visit (HOSPITAL_COMMUNITY): Payer: Self-pay | Admitting: Adult Health

## 2016-12-19 ENCOUNTER — Encounter (HOSPITAL_COMMUNITY): Payer: Self-pay | Admitting: Adult Health

## 2016-12-19 DIAGNOSIS — C50911 Malignant neoplasm of unspecified site of right female breast: Secondary | ICD-10-CM

## 2016-12-19 MED ORDER — ALPRAZOLAM 0.25 MG PO TABS: 0.2500 mg | ORAL_TABLET | Freq: Three times a day (TID) | ORAL | 1 refills | Status: DC | PRN

## 2016-12-19 NOTE — Progress Notes (Signed)
Patient called cancer center requesting refill of Xanax.   [place] Controlled Substance Reporting System reviewed and refill is appropriate on or after 12/19/16. Paper prescription printed & post-dated; Rx left at cancer center front desk for patient to retrieve after showing photo ID per clinic policy.   [place] reviewed:     Mike Craze, NP [place] [phone]

## 2017-01-17 ENCOUNTER — Other Ambulatory Visit (HOSPITAL_COMMUNITY): Payer: Self-pay | Admitting: Adult Health

## 2017-01-17 DIAGNOSIS — C50911 Malignant neoplasm of unspecified site of right female breast: Secondary | ICD-10-CM

## 2017-01-17 NOTE — Telephone Encounter (Signed)
Awesome. Thanks so much!   gwd

## 2017-01-17 NOTE — Telephone Encounter (Signed)
Hey,   I called Carly [name]. Carly [name] has a xanax prescription ready for pick up (they are expecting her anytime). After she picks this prescription up she has no more refills.

## 2017-01-17 NOTE — Telephone Encounter (Signed)
Sharyn Lull,  I received refill request from pharmacy for her Xanax.  Can you help me see if she still has refills of her Xanax at her pharmacy?  Looks like it was last filled on 12/28/16 at [facility] and there should be 1 refill remaining?    Note: We are no longer filling her pain medication because she has pain specialist, but we are helping manage her Xanax.    Thanks! Mike Craze, NP [place] [phone]

## 2017-01-18 ENCOUNTER — Other Ambulatory Visit (HOSPITAL_COMMUNITY): Payer: Self-pay | Admitting: Oncology

## 2017-01-18 DIAGNOSIS — G620 Drug-induced polyneuropathy: Secondary | ICD-10-CM

## 2017-01-18 DIAGNOSIS — T451X5A Adverse effect of antineoplastic and immunosuppressive drugs, initial encounter: Principal | ICD-10-CM

## 2017-01-24 ENCOUNTER — Ambulatory Visit (HOSPITAL_COMMUNITY): Payer: Medicaid Other | Admitting: Adult Health

## 2017-01-24 ENCOUNTER — Other Ambulatory Visit (HOSPITAL_COMMUNITY): Payer: Medicaid Other

## 2017-02-07 ENCOUNTER — Telehealth (HOSPITAL_COMMUNITY): Payer: Self-pay

## 2017-02-07 NOTE — Telephone Encounter (Signed)
Left message on patient's voicemail that the referral to Dr. Francesco Runner needs to come from her current pain management clinic. If she has any questions or problems with obtaining the referral then call cancer center.

## 2017-02-07 NOTE — Telephone Encounter (Signed)
Patient called stating she wants the cancer center to start prescribing her pain medication. She states her first visit at the pain clinic she saw Dr. Maryjean Ka and he told her he would not "take away her Norco". The second visit she saw   Hayes Ludwig. The PA has started decreasing her Norco. Patient states she has to take 2 pills every 8 hours. She states she does not take the Norco for neuropathy pain but for bone pain. "Sometimes I have to take 2 1/2 pills to get out of bed because my legs hurt so bad." Patient states the PA has decreased her Norco by 30 pills two different times. She wants to know if she can start getting her pain medication through [facility] again. I explained to patient that since she had been referred to a pain clinic by [place], we want her to get her treatment for her chronic pain at the pain clinic. We still see her for follow ups regarding her cancer. She asked what should she do. I suggested she call the pain clinic and get an appointment with Dr. Lovenia Shuck, not the PA. Patient wanted me to ask provider if she could return to [facility] for pain control. I explained I would talk with Gretchen,NP and call her back. Patient verbalized understanding.

## 2017-02-07 NOTE — Telephone Encounter (Signed)
You are correct, Carly [name]. We referred her to pain clinic for management of her non-cancer related pain. We defer to the pain medicine specialists, as it is their expertise to manage chronic pain.  We will not resume prescribing her pain medications.    Mike Craze, NP [place] [phone]

## 2017-02-07 NOTE — Telephone Encounter (Signed)
Called patient back and explained that she really needs to see a pain management specialist for her chronic pain. She states she has seen Dr. Jenetta Downer' Toole, pain management doctor, in the past when he was in [place]. She is requesting referral to see him again. Per patient, he now has an office in [place].

## 2017-02-07 NOTE — Telephone Encounter (Signed)
The referral may need to come from her current pain management clinic. I'm not sure what type of controlled substance contract she may have signed with them and she should check with their office before we make any additional referrals.   gwd

## 2017-02-23 ENCOUNTER — Other Ambulatory Visit (HOSPITAL_COMMUNITY): Payer: Self-pay | Admitting: Oncology

## 2017-02-23 DIAGNOSIS — C50911 Malignant neoplasm of unspecified site of right female breast: Secondary | ICD-10-CM

## 2017-02-27 NOTE — Progress Notes (Signed)
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 Maryclare [name] of palpable mass, lower outer quadrant of reconstructed R breast close to nipple ER+ 97%, PR+ 70 (weak) HER 2 - by [facility]      07/06/2015 Surgery    Definitive lumpectomy and LN dissection performed revealing a 3.1 cm primary 1/14 positive LN with a macro metastases, satellite nodules adjacent to the primary, LVI-, however she had skin invasion along with satellite nodules, T4b N1a, Stage IIIB, grade IIII,      07/07/2015 Imaging    Per records negative CT C/A/P and bone scan      08/15/2015 - 11/22/2015 Chemotherapy    AC x 4, Taxol X 4 both given dose dense       - 01/27/2016 Radiation Therapy    Dr. Harlow [name]. Carly [name]      02/06/2016 Imaging    Bone density- BMD as determined from [facility] L1-L4 is 1.021 g/cm2 with a T-Score of -1.3. This patient is considered osteopenic according to [facility]) criteria.         HISTORY OF PRESENT ILLNESS:  (From Carly [name] note on 10/25/16)  Recurrent breast cancer ([place]) BRCA1+, Stage III invasive adenocarcinoma right breast, ER/PR+ HER2 NEGATIVE, diagnosed in 2016 treated with definitive lumpectomy and LN dissection on 07/06/2015 followed by [facility] x 4 then T x 4 in dose dense fashion (08/15/2015- 11/22/2015) by Carly [name], followed by XRT finishing on 01/27/2016. She was prescribed Femara. AND Stage I DCIS BRCA+ diagnosed in 2013; double mastectomy in [place], reconstruction with Dr. Maryclare [name] AND Bilateral mastectomy,  Hysterectomy/oophorectomy HX DVT BRCA1 positivity Chemotherapy induced neuropathy    INTERVAL HISTORY:  Carly [name] 36 y.o. female returns for routine follow-up for history of recurrent breast cancer and osteopenia.   Overall, she tells me she has been feeling "so-so." Appetite 100%; energy levels 50%.  She continues to struggle with chronic pain to her legs and back.  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 She shares with me that her Cymbalta and Neurontin doses were recently adjusted and she is seeing some improvement in her symptoms. However, she is concerned about bone pains.   She asks me if I will order a PET scan because "I just need to know that everything is okay and all the treatment that I went through actually worked."  She feels like her arthralgias/bone pains are worse. She has weakness in her (R) arm; continues to struggle with lymphedema, "but I've gained so much weight that I can't wear my sleeve all the time because it's too uncomfortable."  Continues on Femara, but wants to know if there is something else she can take instead. She has bothersome hot flashes. She doesn't sleep well. Reports easy bruising and bilateral leg swelling.   She shares with me that her brother suddenly died unexpectedly earlier this week.  "He had a heart attack in his sleep and was only 37 years old."  She is  requesting refill of Xanax today.   Per patient, she did not have hysterectomy and still has her cervix and uterus. "I only had my ovaries removed." (All of patient's medical record history we have available for review includes hysterectomy). She continues to have routine pap smears by GYN per her report. Denies any vaginal bleeding.     REVIEW OF SYSTEMS:  Review of Systems  Constitutional: Positive for fatigue. Negative for chills and fever.  HENT:  Negative.   Eyes: Negative.   Respiratory: Negative.  Negative for  cough and shortness of breath.   Cardiovascular: Positive for leg swelling. Negative for chest pain.  Gastrointestinal: Negative.  Negative for abdominal pain, blood in stool, constipation, diarrhea, nausea and vomiting.  Endocrine: Positive for hot flashes.  Genitourinary: Negative.  Negative for dysuria, hematuria and vaginal bleeding.   Musculoskeletal: Positive for arthralgias, back pain and myalgias.  Skin: Negative.  Negative for rash.  Neurological: Positive for numbness. Negative for dizziness and headaches.  Hematological: Bruises/bleeds easily.  Psychiatric/Behavioral: Positive for depression and sleep disturbance. The patient is nervous/anxious.      PAST MEDICAL/SURGICAL HISTORY:  Past Medical History:  Diagnosis Date  . Breast cancer ([place]) 2013   BRCA1 positive   Past Surgical History:  Procedure Laterality Date  . ABDOMINAL HYSTERECTOMY    . CESAREAN SECTION    . MASTECTOMY       SOCIAL HISTORY:  Social History   Social History  . Marital status: Single    Spouse name: N/A  . Number of children: N/A  . Years of education: N/A   Occupational History  . Not on file.   Social History Main Topics  . Smoking status: Never Smoker  . Smokeless tobacco: Never Used  . Alcohol use No  . Drug use: No  . Sexual activity: Not on file   Other Topics Concern  . Not on file   Social History Narrative  . No narrative on file    FAMILY HISTORY:  Family History  Problem Relation Age of Onset  . Breast cancer Mother 20  . BRCA 1/2 Mother   . Prostate cancer Father        metastatic  . Breast cancer Sister 44       BRCA1 mutation  . Breast cancer Maternal Aunt        dx in her 90s; BRCA1 mutation  . Heart attack Maternal Grandfather   . Ovarian cancer Paternal Grandmother        dx in her 29s  . Heart attack Paternal Grandfather   . Healthy Sister   . BRCA 1/2 Maternal Aunt   . Healthy Maternal Aunt        negative for a BRCA mutation  . Healthy  Maternal Aunt        negative for a BRCA mutation    CURRENT MEDICATIONS:  Outpatient Encounter Prescriptions as of 02/28/2017  Medication Sig Note  . ALPRAZolam (XANAX) 0.25 MG tablet Take 1 tablet (0.25 mg total) by mouth 3 (three) times daily as needed. for anxiety   . amitriptyline (ELAVIL) 25 MG tablet    . cyclobenzaprine (FLEXERIL) 5 MG tablet Take 5 mg by mouth 3 (three) times daily as needed.  01/26/2016: Received from: [place]: Take one tablet (5 mg total) by mouth every 4 (four) hours as needed for Muscle spasms.  Marland Kitchen docusate sodium (COLACE) 100 MG capsule Take by mouth. 01/26/2016: Received from: [place]: Take one capsule (  100 mg total) by mouth 2 (two) times daily.  . DULoxetine (CYMBALTA) 30 MG capsule    . DULoxetine (CYMBALTA) 60 MG capsule Take 1 capsule (60 mg total) by mouth daily. 12/12/2016: 108m daily  . gabapentin (NEURONTIN) 600 MG tablet Take 900 mg by mouth 2 (two) times daily.    .Marland KitchenHYDROcodone-acetaminophen (NORCO) 10-325 MG tablet Take 1 tablet by mouth every 4 (four) hours as needed. (Patient taking differently: Take 1 tablet by mouth every 6 (six) hours as needed. )   . ibuprofen (ADVIL,MOTRIN) 200 MG tablet Take by mouth. 01/26/2016: [place] [facility] 200 mg by mouth as needed for Pain.  . ondansetron (ZOFRAN) 4 MG tablet TAKE ONE TABLET BY MOUTH EVERY 6 HOURS AS NEEDED FOR NAUSEA 01/26/2016: [place]ceived [facility]: [facility] . pantoprazole (PROTONIX) 40 MG tablet Take 1 tablet (40 mg total) by mouth daily.   . phentermine (ADIPEX-P) 37.5 MG tablet Take 37.5 mg by mouth daily.   . prochlorperazine (COMPAZINE) 10 MG tablet Take by mouth. 01/26/2016: Received from: [facility] Take 10 mg by mouth every 6 (six) hours as needed.  . promethazine (PHENERGAN) 12.5 MG tablet Take by mouth. 01/26/2016: Received from: [facility] Take 12.5 mg by mouth every 6 (six) hours as needed for Nausea.  .  [DISCONTINUED] ALPRAZolam (XANAX) 0.25 MG tablet Take 1 tablet (0.25 mg total) by mouth 3 (three) times daily as needed. for anxiety   . [DISCONTINUED] gabapentin (NEURONTIN) 300 MG capsule TAKE TWO (2) CAPSULES BY MOUTH THREE TIMES DAILY (Patient taking differently: Take 1.5 tablets in the morning and 1.5 tablets in the afternoon)   . [DISCONTINUED] letrozole (FEMARA) 2.5 MG tablet Take 1 tablet (2.5 mg total) by mouth daily.   . [DISCONTINUED] exemestane (AROMASIN) 25 MG tablet Take 1 tablet (25 mg total) by mouth daily after breakfast.    No facility-administered encounter medications on file as of 02/28/2017.     ALLERGIES:  Allergies  Allergen Reactions  . Oxycodone-Acetaminophen Hives    Nausea and Vomiting  . Sumatriptan Other (See Comments)    Other reaction(s): Unknown Numbness of face and arms     PHYSICAL EXAM:  ECOG Performance status: 1 - 2 - Symptomatic; remains largely independent.   Vitals:   02/28/17 1334  BP: (!) 141/83  Pulse: 95  Resp: 18  SpO2: 100%   Filed Weights   02/28/17 1334  Weight: 280 lb (127 kg)    Physical Exam  Constitutional: She is oriented to person, place, and time and well-developed, well-nourished, and in no distress.  HENT:  Head: Normocephalic.  Mouth/Throat: Oropharynx is clear and moist. No oropharyngeal exudate.  Eyes: Pupils are equal, round, and reactive to light. Conjunctivae are normal. No scleral icterus.  Neck: Normal range of motion. Neck supple.  Cardiovascular: Normal rate, regular rhythm and normal heart sounds.   Pulmonary/Chest: Effort normal and breath sounds normal. No respiratory distress.    Abdominal: Soft. Bowel sounds are normal. There is no tenderness.  Musculoskeletal: Normal range of motion. She exhibits edema (Trace pedal/ankle edema).  (R) arm lymphedema   Lymphadenopathy:    She has no cervical adenopathy.       Right: No supraclavicular adenopathy present.       Left: No supraclavicular adenopathy  present.  Neurological: She is alert and oriented to person, place, and time. No cranial nerve deficit. Gait normal.  Skin: Skin is warm and dry. No rash noted.  Psychiatric: Memory and judgment normal. Her mood appears anxious.  Tearful at times during visit -Histrionic at times as well.   Nursing note and vitals reviewed.    LABORATORY DATA:  I have reviewed the labs as listed.  CBC    Component Value Date/Time   WBC 3.8 (L) 02/28/2017 1247   RBC 3.99 02/28/2017 1247   HGB 12.2 02/28/2017 1247   HCT 35.3 (L) 02/28/2017 1247   PLT 123 (L) 02/28/2017 1247   MCV 88.5 02/28/2017 1247   MCH 30.6 02/28/2017 1247   MCHC 34.6 02/28/2017 1247   RDW 12.8 02/28/2017 1247   LYMPHSABS 1.4 02/28/2017 1247   MONOABS 0.3 02/28/2017 1247   EOSABS 0.1 02/28/2017 1247   BASOSABS 0.0 02/28/2017 1247   CMP Latest Ref Rng & Units 02/28/2017 12/12/2016 09/12/2016  Glucose 65 - 99 mg/dL 104(H) 92 100(H)  BUN 6 - 20 mg/dL '9 10 10  ' Creatinine 0.44 - 1.00 mg/dL 0.59 0.75 0.69  Sodium 135 - 145 mmol/L 140 140 137  Potassium 3.5 - 5.1 mmol/L 4.0 3.9 3.8  Chloride 101 - 111 mmol/L 107 106 104  CO2 22 - 32 mmol/L '27 26 27  ' Calcium 8.9 - 10.3 mg/dL 9.0 9.3 9.0  Total Protein 6.5 - 8.1 g/dL 7.3 7.4 7.4  Total Bilirubin 0.3 - 1.2 mg/dL 0.8 0.8 0.7  Alkaline Phos 38 - 126 U/L 39 47 47  AST 15 - 41 U/L '21 22 21  ' ALT 14 - 54 U/L '18 20 18    ' PENDING LABS:    DIAGNOSTIC IMAGING:    PATHOLOGY:     ASSESSMENT & PLAN:   Stage IIIB recurrent invasive ductal carcinoma of (R) breast, ER+/PR+/HER2-; BRCA1 +:  -History of DCIS (diagnosed in 2013). Recurrence diagnosed in 2016. Treated with bilateral mastectomies with (R) axillary lymph node dissection, followed by adjuvant chemo with Adriamycin/Cytoxan x 4, followed by adjuvant radiation therapy. Completed treatment on 01/27/16. Started Femara in 01/2016.  -Remains on Femara, but expresses concerns regarding hot flashes and progressive bone pain. She would  like to consider taking a different anti-estrogen medication.  -Discussed with her that I have very low clinical suspicion that her joint pains are d/t malignancy, but rather to side effects of treatment and other comorbid conditions. Certainly the Femara could be contributing; she would like to try a different anti-estrogen medication to see if she may tolerate it better.  She understands that all aromatase inhibitors have similar side effect profile, but anecdotally there are some patients who are able to tolerate one medication over another with less side effects.  Therefore, I am willing to switch her to Aromasin to try; she agreed with this plan.  -Clinical breast exam performed today and benign. No role for mammography given bilateral mastectomies with reconstruction.  -Return to cancer center in 2 months to assess tolerance to Aromasin.   Addendum:  *Patient contacted cancer center stating that new medication, Aromasin, was too expensive for her.   *E-scribed Anastrozole to her pharmacy.    Progressive bone pain:  -Per patient, arthralgias are worse. This may be secondary to anti-estrogen therapy. She has minimal focal pain, except to her back and bilateral legs. Otherwise, her arthralgias are more generalized.    -Shared with her that PET scan is not clinically appropriate.  However, I will obtain bone scan to evaluate for metastatic disease; she understands that I have very low clinical suspicion for malignancy as the etiology of her bone pain. Patient agrees with  this plan "and it will help put my mind at ease a little bit."  I suspect that her anxiety and hypervigilance regarding her symptoms significantly contributes to her perception of pain.  -Her chronic pain itself is managed by pain medicine specialists; maintain follow-up with pain team as directed.    Bone health:  -Last DEXA scan 02/06/16 showed osteopenia with T-score -1.3.  -She is at high risk for continued bone  demineralization given history of oopherectomy and current aromatase inhibitor use.  -Continue Prolia injections every 6 months Oncology Flowsheet 12/12/2016  denosumab (PROLIA) [place] 60 mg  -Recommended calcium/vitamin D supplementation and increasing weight-bearing exercises as tolerated.    Anxiety about health:  -Provided emotional support through active listening, validation of her concerns, and expressive supportive counseling.  She struggles with fear of recurrence.  -Xanax refilled. (of note: this is the only controlled substance patient receives from the cancer center).  -She would probably benefit from support through behavioral health and counseling/therapy. We did not discuss today, but can consider recommending/referring in the future if she agrees.         Dispo:  -Stop Femara. Start Anastrozole.  -Return to cancer center in 2 months for follow-up.    All questions were answered to patient's stated satisfaction. Encouraged patient to call with any new concerns or questions before her next visit to the cancer center and we can certain see her sooner, if needed.    Plan of care discussed with Dr. Talbert Cage, who agrees with the above aforementioned.    A total of 35 minutes was spent in face-to-face care of this patient, with greater than 50% of that time spent in counseling and care-coordination.    Orders placed this encounter:  Orders Placed This Encounter  Procedures  . NM Bone Scan Whole Body      Mike Craze, NP [place] [phone]

## 2017-02-28 ENCOUNTER — Encounter (HOSPITAL_COMMUNITY): Payer: BLUE CROSS/BLUE SHIELD | Attending: Hematology

## 2017-02-28 ENCOUNTER — Encounter (HOSPITAL_BASED_OUTPATIENT_CLINIC_OR_DEPARTMENT_OTHER): Payer: BLUE CROSS/BLUE SHIELD | Admitting: Adult Health

## 2017-02-28 VITALS — BP 141/83 | HR 95 | Resp 18 | Ht 67.0 in | Wt 280.0 lb

## 2017-02-28 DIAGNOSIS — C50911 Malignant neoplasm of unspecified site of right female breast: Secondary | ICD-10-CM

## 2017-02-28 DIAGNOSIS — T451X5A Adverse effect of antineoplastic and immunosuppressive drugs, initial encounter: Secondary | ICD-10-CM | POA: Diagnosis not present

## 2017-02-28 DIAGNOSIS — Z1501 Genetic susceptibility to malignant neoplasm of breast: Secondary | ICD-10-CM | POA: Diagnosis not present

## 2017-02-28 DIAGNOSIS — M898X9 Other specified disorders of bone, unspecified site: Secondary | ICD-10-CM

## 2017-02-28 DIAGNOSIS — G4700 Insomnia, unspecified: Secondary | ICD-10-CM

## 2017-02-28 DIAGNOSIS — F419 Anxiety disorder, unspecified: Secondary | ICD-10-CM

## 2017-02-28 DIAGNOSIS — Z86000 Personal history of in-situ neoplasm of breast: Secondary | ICD-10-CM | POA: Diagnosis not present

## 2017-02-28 DIAGNOSIS — N951 Menopausal and female climacteric states: Secondary | ICD-10-CM

## 2017-02-28 DIAGNOSIS — M8580 Other specified disorders of bone density and structure, unspecified site: Secondary | ICD-10-CM | POA: Diagnosis not present

## 2017-02-28 DIAGNOSIS — I890 Lymphedema, not elsewhere classified: Secondary | ICD-10-CM | POA: Diagnosis not present

## 2017-02-28 DIAGNOSIS — Z1502 Genetic susceptibility to malignant neoplasm of ovary: Secondary | ICD-10-CM | POA: Diagnosis not present

## 2017-02-28 DIAGNOSIS — C50919 Malignant neoplasm of unspecified site of unspecified female breast: Secondary | ICD-10-CM

## 2017-02-28 DIAGNOSIS — D701 Agranulocytosis secondary to cancer chemotherapy: Secondary | ICD-10-CM | POA: Insufficient documentation

## 2017-02-28 DIAGNOSIS — Z1509 Genetic susceptibility to other malignant neoplasm: Secondary | ICD-10-CM

## 2017-02-28 LAB — CBC WITH DIFFERENTIAL/PLATELET
BASOS ABS: 0 10*3/uL (ref 0.0–0.1)
BASOS PCT: 0 %
EOS ABS: 0.1 10*3/uL (ref 0.0–0.7)
EOS PCT: 1 %
HCT: 35.3 % — ABNORMAL LOW (ref 36.0–46.0)
HEMOGLOBIN: 12.2 g/dL (ref 12.0–15.0)
Lymphocytes Relative: 36 %
Lymphs Abs: 1.4 10*3/uL (ref 0.7–4.0)
MCH: 30.6 pg (ref 26.0–34.0)
MCHC: 34.6 g/dL (ref 30.0–36.0)
MCV: 88.5 fL (ref 78.0–100.0)
Monocytes Absolute: 0.3 10*3/uL (ref 0.1–1.0)
Monocytes Relative: 8 %
NEUTROS PCT: 55 %
Neutro Abs: 2.1 10*3/uL (ref 1.7–7.7)
PLATELETS: 123 10*3/uL — AB (ref 150–400)
RBC: 3.99 MIL/uL (ref 3.87–5.11)
RDW: 12.8 % (ref 11.5–15.5)
WBC: 3.8 10*3/uL — AB (ref 4.0–10.5)

## 2017-02-28 LAB — COMPREHENSIVE METABOLIC PANEL
ALBUMIN: 3.9 g/dL (ref 3.5–5.0)
ALK PHOS: 39 U/L (ref 38–126)
ALT: 18 U/L (ref 14–54)
ANION GAP: 6 (ref 5–15)
AST: 21 U/L (ref 15–41)
BUN: 9 mg/dL (ref 6–20)
CALCIUM: 9 mg/dL (ref 8.9–10.3)
CHLORIDE: 107 mmol/L (ref 101–111)
CO2: 27 mmol/L (ref 22–32)
Creatinine, Ser: 0.59 mg/dL (ref 0.44–1.00)
GFR calc non Af Amer: >60 mL/min (ref >60)
GLUCOSE: 104 mg/dL — AB (ref 65–99)
POTASSIUM: 4 mmol/L (ref 3.5–5.1)
Sodium: 140 mmol/L (ref 135–145)
Total Bilirubin: 0.8 mg/dL (ref 0.3–1.2)
Total Protein: 7.3 g/dL (ref 6.5–8.1)

## 2017-02-28 MED ORDER — EXEMESTANE 25 MG PO TABS: 25.0000 mg | ORAL_TABLET | Freq: Every day | ORAL | 2 refills | Status: DC

## 2017-02-28 MED ORDER — ALPRAZOLAM 0.25 MG PO TABS: 0.2500 mg | ORAL_TABLET | Freq: Three times a day (TID) | ORAL | 1 refills | Status: DC | PRN

## 2017-02-28 NOTE — Patient Instructions (Addendum)
Carly [name] at [facility] Discharge Instructions  RECOMMENDATIONS MADE BY THE CONSULTANT AND ANY TEST RESULTS WILL BE SENT TO YOUR REFERRING PHYSICIAN.  You were seen today by Mike Craze NP. STOP taking femara and start taking exemestane (aromasin). Bone scan to be scheduled in near future. Return in 2 months for follow up.   Thank you for choosing [facility] at [facility] to provide your oncology and hematology care.  To afford each patient quality time with our provider, please arrive at least 15 minutes before your scheduled appointment time.    If you have a lab appointment with the [facility] please come in thru the  Main Entrance and check in at the main information desk  You need to re-schedule your appointment should you arrive 10 or more minutes late.  We strive to give you quality time with our providers, and arriving late affects you and other patients whose appointments are after yours.  Also, if you no show three or more times for appointments you may be dismissed from the clinic at the providers discretion.     Again, thank you for choosing [facility].  Our hope is that these requests will decrease the amount of time that you wait before being seen by our physicians.       _____________________________________________________________  Should you have questions after your visit to [facility], please contact our office at [phone] between the hours of 8:30 a.m. and 4:30 p.m.  Voicemails left after 4:30 p.m. will not be returned until the following business day.  For prescription refill requests, have your pharmacy contact our office.       Resources For Cancer Patients and their Caregivers ? American Cancer Society: Can assist with transportation, wigs, general needs, runs Look Good Feel Better.        332-348-5852 ? Cancer Care: Provides financial assistance, online support groups,  medication/co-pay assistance.  1-800-813-HOPE (518)010-8700) ? [place] Assists [place] cancer patients and their families through emotional , educational and financial support.  (651) 820-7836 ? [place] DSS Where to apply for food stamps, Medicaid and utility assistance. 678-167-3681 ? [facility]: Transportation to medical appointments. 406-270-5941 ? Social Security Administration: May apply for disability if have a Stage IV cancer. 978-224-4657 6202417264 ? [employer], Disability and Transit Services: Assists with nutrition, care and transit needs. [place] Support Programs: @10RELATIVEDAYS @ > Cancer Support Group  2nd Tuesday of the month 1pm-2pm, Journey Room  > Creative Journey  3rd Tuesday of the month 1130am-1pm, Journey Room  > Look Good Feel Better  1st Wednesday of the month 10am-12 noon, Journey Room (Call [place] to register [phone])

## 2017-03-01 ENCOUNTER — Telehealth (HOSPITAL_COMMUNITY): Payer: Self-pay

## 2017-03-01 ENCOUNTER — Other Ambulatory Visit (HOSPITAL_COMMUNITY): Payer: Self-pay | Admitting: Adult Health

## 2017-03-01 DIAGNOSIS — C50911 Malignant neoplasm of unspecified site of right female breast: Secondary | ICD-10-CM

## 2017-03-01 DIAGNOSIS — Z1509 Genetic susceptibility to other malignant neoplasm: Secondary | ICD-10-CM

## 2017-03-01 DIAGNOSIS — Z1501 Genetic susceptibility to malignant neoplasm of breast: Secondary | ICD-10-CM

## 2017-03-01 MED ORDER — ANASTROZOLE 1 MG PO TABS: 1.0000 mg | ORAL_TABLET | Freq: Every day | ORAL | 2 refills | Status: DC

## 2017-03-01 NOTE — Telephone Encounter (Signed)
-----   Message from Louis Meckel sent at 03/01/2017 10:21 AM EDT ----- Regarding: unable to get med Patient was put on new med yesterday and is unable to get it due to cost.  Please contact patient to see if there is another med she can get.  Thanks

## 2017-03-01 NOTE — Telephone Encounter (Signed)
Reviewed with Mike Craze, NP. She changed patient to Anastrazole 1mg  daily. Patient notified and stated if she could not afford the co-pay on this then she will stay on the Femara.

## 2017-03-04 ENCOUNTER — Encounter (HOSPITAL_COMMUNITY)
Admission: RE | Admit: 2017-03-04 | Discharge: 2017-03-04 | Disposition: A | Discharge status: Home / Self Care | Payer: BLUE CROSS/BLUE SHIELD | Source: Ambulatory Visit | Attending: Adult Health | Admitting: Adult Health

## 2017-03-04 ENCOUNTER — Encounter (HOSPITAL_COMMUNITY): Payer: Self-pay

## 2017-03-04 DIAGNOSIS — Z1501 Genetic susceptibility to malignant neoplasm of breast: Secondary | ICD-10-CM | POA: Diagnosis present

## 2017-03-04 DIAGNOSIS — Z1509 Genetic susceptibility to other malignant neoplasm: Secondary | ICD-10-CM | POA: Insufficient documentation

## 2017-03-04 DIAGNOSIS — M898X9 Other specified disorders of bone, unspecified site: Secondary | ICD-10-CM | POA: Insufficient documentation

## 2017-03-04 DIAGNOSIS — C50911 Malignant neoplasm of unspecified site of right female breast: Secondary | ICD-10-CM | POA: Diagnosis not present

## 2017-03-04 MED ORDER — TECHNETIUM TC 99M MEDRONATE IV KIT
25.0000 | PACK | Freq: Once | INTRAVENOUS | Status: AC | PRN
  Administered 2017-03-04: 18.8 via INTRAVENOUS

## 2017-03-05 ENCOUNTER — Other Ambulatory Visit (HOSPITAL_COMMUNITY): Payer: Self-pay | Admitting: Adult Health

## 2017-03-05 DIAGNOSIS — C50911 Malignant neoplasm of unspecified site of right female breast: Secondary | ICD-10-CM

## 2017-03-05 DIAGNOSIS — M898X9 Other specified disorders of bone, unspecified site: Secondary | ICD-10-CM

## 2017-03-12 ENCOUNTER — Ambulatory Visit (HOSPITAL_COMMUNITY)
Admission: RE | Admit: 2017-03-12 | Discharge: 2017-03-12 | Disposition: A | Discharge status: Home / Self Care | Payer: BLUE CROSS/BLUE SHIELD | Source: Ambulatory Visit | Attending: Adult Health | Admitting: Adult Health

## 2017-03-12 DIAGNOSIS — C50911 Malignant neoplasm of unspecified site of right female breast: Secondary | ICD-10-CM | POA: Diagnosis not present

## 2017-03-12 DIAGNOSIS — M898X9 Other specified disorders of bone, unspecified site: Secondary | ICD-10-CM

## 2017-03-12 DIAGNOSIS — Z170 Estrogen receptor positive status [ER+]: Secondary | ICD-10-CM | POA: Insufficient documentation

## 2017-03-12 DIAGNOSIS — C50519 Malignant neoplasm of lower-outer quadrant of unspecified female breast: Secondary | ICD-10-CM | POA: Insufficient documentation

## 2017-03-12 DIAGNOSIS — C50511 Malignant neoplasm of lower-outer quadrant of right female breast: Secondary | ICD-10-CM | POA: Insufficient documentation

## 2017-04-26 ENCOUNTER — Other Ambulatory Visit (HOSPITAL_COMMUNITY): Payer: Self-pay

## 2017-04-26 DIAGNOSIS — C50919 Malignant neoplasm of unspecified site of unspecified female breast: Secondary | ICD-10-CM

## 2017-04-26 DIAGNOSIS — I890 Lymphedema, not elsewhere classified: Secondary | ICD-10-CM

## 2017-04-26 MED ORDER — MISC. DEVICES MISC: 0 refills | Status: AC

## 2017-04-30 ENCOUNTER — Ambulatory Visit (HOSPITAL_COMMUNITY): Payer: BLUE CROSS/BLUE SHIELD

## 2017-05-02 DIAGNOSIS — N952 Postmenopausal atrophic vaginitis: Secondary | ICD-10-CM | POA: Insufficient documentation

## 2017-05-02 DIAGNOSIS — I890 Lymphedema, not elsewhere classified: Secondary | ICD-10-CM | POA: Insufficient documentation

## 2017-05-03 ENCOUNTER — Other Ambulatory Visit (HOSPITAL_COMMUNITY): Payer: Self-pay | Admitting: Adult Health

## 2017-05-03 ENCOUNTER — Encounter (HOSPITAL_COMMUNITY): Payer: Self-pay | Admitting: Adult Health

## 2017-05-03 DIAGNOSIS — Z1509 Genetic susceptibility to other malignant neoplasm: Secondary | ICD-10-CM

## 2017-05-03 DIAGNOSIS — C50911 Malignant neoplasm of unspecified site of right female breast: Secondary | ICD-10-CM

## 2017-05-03 DIAGNOSIS — Z1501 Genetic susceptibility to malignant neoplasm of breast: Secondary | ICD-10-CM

## 2017-05-03 NOTE — Progress Notes (Signed)
 [facility] [address] 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, ER+/PR+/HER2-; BRCA1 +  AND Osteopenia   CURRENT THERAPY: Anastrozole daily AND Prolia injections every 6 months    BRIEF ONCOLOGIC HISTORY:    Recurrent breast cancer (HCC)   05/06/2015 Initial Biopsy    Incisional biopsy with Carly Carly, lower outer quadrant of reconstructed R breast close to nipple ER+ 97%, PR+ 70 (weak) HER 2 - by IHC      07/06/2015 Surgery    Definitive lumpectomy and LN dissection performed revealing Carly 3.1 cm primary 1/14 positive LN with Carly macro metastases, satellite nodules adjacent to the primary, LVI-, however she had skin invasion along with satellite nodules, T4b N1a, Stage IIIB, grade IIII,      07/07/2015 Imaging    Per records negative CT C/Carly/P and bone scan      08/15/2015 - 11/22/2015 Chemotherapy    AC x 4, Taxol X 4 both given dose dense       - 01/27/2016 Radiation Therapy    Carly Carly/Carly Carly      02/06/2016 Imaging    Bone density- BMD as determined from AP Spine L1-L4 is 1.021 g/cm2 with Carly Carly of -1.3. This patient is considered osteopenic according to World Health Organization (WHO) criteria.         HISTORY OF PRESENT ILLNESS:  (From Dr. Zhou's note on 10/25/16)  Recurrent breast cancer (HCC) BRCA1+, Stage III invasive adenocarcinoma right breast, ER/PR+ HER2 NEGATIVE, diagnosed in 2016 treated with definitive lumpectomy and LN dissection on 07/06/2015 followed by AC x 4 then T x 4 in dose dense fashion (08/15/2015- 11/22/2015) by Carly Carly, followed by XRT finishing on 01/27/2016. She was prescribed Femara. AND Stage I DCIS BRCA+ diagnosed in 2013; double mastectomy in [place], reconstruction with Carly Carly AND Bilateral  mastectomy, Hysterectomy/oophorectomy HX DVT BRCA1 positivity Chemotherapy induced neuropathy    INTERVAL HISTORY:  Carly Carly 36 y.o. female returns for routine follow-up for history of recurrent breast cancer and osteopenia.   Overall, she tells me she has been "doing okay." Appetite 100%; energy levels 75%.  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  She underwent bone scan and skeletal survey X-ray since her last visit; she received the results via phone, but would like to review these results again together today.   Started Anastrozole ~2 months ago. She feels like she has seen Carly "slight improvement" in her bone pain/arthralgias since changing from Femara to Arimidex.  She continues to have hot flashes, which are manageable.  Her leg pain is worse with colder weather, "so it's been really bad lately."  Heat helps her pain.   Denies any changes to her chest/breasts. No new lumps/bumps or skin changes.  She is seeing PT for her (R) arm lymphedema; she is excited to share with me that she has been approved for ~30 treatments for her lymphedema (instead of previously approved 2 visits with her Medicaid).    Her anxiety remains apparent; she takes Xanax PRN which is helpful.  She just found out her boyfriend cheated on her last week and she has since had him move out of her home.  She is caring for Carly newborn, who was   apparently born addicted to heroin and nicotine; he is the child of someone in her family "and I didn't want him to go into the system" so she is caring for him.  Carly Carly's daughter is pregnant with her grandson and is due in ~6 weeks.  She is looking forward to him being born.  She is anxiously anticipating her disability hearing in early December as well.   Shares with me that she recently saw her GYN for routine exam; she claims to still have her uterus and cervix (our medical records indicate that she had hysterectomy).  Denies  any vaginal bleeding.   She wants to talk about our recommendations in terms of obtaining mammogram and/or breast MRI since she is BRCA1 positive, "even though I've had mastectomies with reconstruction." Reports that her GYN encouraged her to talk to us about this at follow-up.   She has some sinus drainage that started ~5 days ago. Endorses yellow/green output from her nose and stuffy nose/congestion. She is requesting Carly Rx for Z-pak which has been helpful for her in the past.      REVIEW OF SYSTEMS:  Review of Systems  Constitutional: Positive for fatigue. Negative for chills and fever.  HENT:         Nasal congestion and drainage   Eyes: Negative.   Respiratory: Negative.  Negative for cough and shortness of breath.   Cardiovascular: Positive for leg swelling (chronic).  Gastrointestinal: Negative.  Negative for abdominal pain, blood in stool, constipation, diarrhea, nausea and vomiting.  Endocrine: Positive for hot flashes.  Genitourinary: Negative.  Negative for dysuria, hematuria and vaginal bleeding.   Musculoskeletal: Positive for arthralgias.       (R) arm lymphedema   Skin: Negative.  Negative for rash.  Neurological: Positive for numbness.  Hematological: Negative.   Psychiatric/Behavioral: The patient is nervous/anxious.      PAST MEDICAL/SURGICAL HISTORY:  Past Medical History:  Diagnosis Date  . Breast cancer (HCC) 2013   BRCA1 positive   Past Surgical History:  Procedure Laterality Date  . ABDOMINAL HYSTERECTOMY    . CESAREAN SECTION    . MASTECTOMY       SOCIAL HISTORY:  Social History   Socioeconomic History  . Marital status: Single    Spouse name: Not on file  . Number of children: Not on file  . Years of education: Not on file  . Highest education level: Not on file  Social Needs  . Financial resource strain: Not on file  . Food insecurity - worry: Not on file  . Food insecurity - inability: Not on file  . Transportation needs - medical: Not  on file  . Transportation needs - non-medical: Not on file  Occupational History  . Not on file  Tobacco Use  . Smoking status: Never Smoker  . Smokeless tobacco: Never Used  Substance and Sexual Activity  . Alcohol use: No  . Drug use: No  . Sexual activity: Not on file  Other Topics Concern  . Not on file  Social History Narrative  . Not on file    FAMILY HISTORY:  Family History  Problem Relation Age of Onset  . Breast cancer Mother 48  . BRCA 1/2 Mother   . Prostate cancer Father        metastatic  . Breast cancer Sister 30       BRCA1 mutation  . Breast cancer Maternal Aunt        dx in her 40s; BRCA1   mutation  . Heart attack Maternal Grandfather   . Ovarian cancer Paternal Grandmother        dx in her 70s  . Heart attack Paternal Grandfather   . Healthy Sister   . BRCA 1/2 Maternal Aunt   . Healthy Maternal Aunt        negative for Carly BRCA mutation  . Healthy Maternal Aunt        negative for Carly BRCA mutation    CURRENT MEDICATIONS:  Outpatient Encounter Medications as of 05/07/2017  Medication Sig Note  . ALPRAZolam (XANAX) 0.25 MG tablet TAKE ONE TABLET BY MOUTH THREE TIMES DAILY AS NEEDED FOR ANXIETY   . amitriptyline (ELAVIL) 25 MG tablet    . anastrozole (ARIMIDEX) 1 MG tablet TAKE ONE TABLET BY MOUTH DAILY   . cyclobenzaprine (FLEXERIL) 5 MG tablet Take 5 mg by mouth 3 (three) times daily as needed.  01/26/2016: Received from: [facility] Received Sig: Take one tablet (5 mg total) by mouth every 4 (four) hours as needed for Muscle spasms.  . docusate sodium (COLACE) 100 MG capsule Take by mouth. 01/26/2016: Received from: [facility] Received Sig: Take one capsule (100 mg total) by mouth 2 (two) times daily.  . DULoxetine (CYMBALTA) 30 MG capsule    . DULoxetine (CYMBALTA) 60 MG capsule Take 1 capsule (60 mg total) by mouth daily. 12/12/2016: 90mg daily  . gabapentin (NEURONTIN) 600 MG tablet Take 900 mg 2 (two) times daily by mouth. Take 900 mg twice daily,  take 600 mg at noon   . HYDROcodone-acetaminophen (NORCO) 10-325 MG tablet Take 1 tablet by mouth every 4 (four) hours as needed. (Patient taking differently: Take 1 tablet by mouth every 6 (six) hours as needed. )   . ibuprofen (ADVIL,MOTRIN) 200 MG tablet Take by mouth. 01/26/2016: Received from: [facility] Received Sig: Take 200 mg by mouth as needed for Pain.  . LORazepam (ATIVAN) 1 MG tablet Take 1 mg by mouth.   . Misc. Devices MISC Please provide patient with Carly compression therapy pump fot her Right arm lymphedema.   . Multiple Vitamins-Minerals (MULTIVITAMIN ADULT PO) Take 1 tablet daily by mouth.   . ondansetron (ZOFRAN) 4 MG tablet TAKE ONE TABLET BY MOUTH EVERY 6 HOURS AS NEEDED FOR NAUSEA 01/26/2016: Received from: [facility]  . pantoprazole (PROTONIX) 40 MG tablet Take 1 tablet (40 mg total) by mouth daily.   . phentermine (ADIPEX-P) 37.5 MG tablet Take 37.5 mg by mouth daily.   . prochlorperazine (COMPAZINE) 10 MG tablet Take by mouth. 01/26/2016: Received from: [facility] Received Sig: Take 10 mg by mouth every 6 (six) hours as needed.  . promethazine (PHENERGAN) 12.5 MG tablet Take by mouth. 01/26/2016: Received from: [facility] Received Sig: Take 12.5 mg by mouth every 6 (six) hours as needed for Nausea.  . valACYclovir (VALTREX) 1000 MG tablet Take 2 tablets 2 (two) times daily by mouth.   . azithromycin (ZITHROMAX) 250 MG tablet Take 2 tabs po on day 1. Then take 1 tab po daily x 4 days.    No facility-administered encounter medications on file as of 05/07/2017.     ALLERGIES:  Allergies  Allergen Reactions  . Sumatriptan Other (See Comments)    Other reaction(s): Unknown Numbness of face and arms     PHYSICAL EXAM:  ECOG Performance status: 1 - 2 - Symptomatic; remains largely independent.   Vitals:   05/07/17 1441  BP: 123/85  Pulse: 87  Resp: 18  Temp:   98.2 F (36.8 C)  SpO2: 100%   Filed Weights   05/07/17 1441  Weight: 281 lb 4.8 oz (127.6 kg)     Physical Exam  Constitutional: She is oriented to person, place, and time and well-developed, well-nourished, and in no distress.  HENT:  Head: Normocephalic.  Mouth/Throat: Oropharynx is clear and moist. No oropharyngeal exudate.  Eyes: Conjunctivae are normal. Pupils are equal, round, and reactive to light. No scleral icterus.  Neck: Normal range of motion. Neck supple.  Cardiovascular: Normal rate and regular rhythm.  Pulmonary/Chest: Effort normal and breath sounds normal. No respiratory distress. She has no wheezes.  Abdominal: Soft. Bowel sounds are normal. There is no tenderness.  Musculoskeletal: Normal range of motion. She exhibits edema.  Significant (R) arm lymphedema   Lymphadenopathy:    She has no cervical adenopathy.       Right: No supraclavicular adenopathy present.       Left: No supraclavicular adenopathy present.  Neurological: She is alert and oriented to person, place, and time. No cranial nerve deficit. Gait normal.  Skin: Skin is warm and dry. No rash noted.  Psychiatric: Mood, memory, affect and judgment normal.  Nursing note and vitals reviewed.    LABORATORY DATA:  I have reviewed the labs as listed.  CBC    Component Value Date/Time   WBC 3.8 (L) 02/28/2017 1247   RBC 3.99 02/28/2017 1247   HGB 12.2 02/28/2017 1247   HCT 35.3 (L) 02/28/2017 1247   PLT 123 (L) 02/28/2017 1247   MCV 88.5 02/28/2017 1247   MCH 30.6 02/28/2017 1247   MCHC 34.6 02/28/2017 1247   RDW 12.8 02/28/2017 1247   LYMPHSABS 1.4 02/28/2017 1247   MONOABS 0.3 02/28/2017 1247   EOSABS 0.1 02/28/2017 1247   BASOSABS 0.0 02/28/2017 1247   CMP Latest Ref Rng & Units 02/28/2017 12/12/2016 09/12/2016  Glucose 65 - 99 mg/dL 104(H) 92 100(H)  BUN 6 - 20 mg/dL _0 Creatinine 0.44 - 1.00 mg/dL 0.59 0.75 0.69  Sodium 135 - 145 mmol/L 140 140 137  Potassium 3.5 - 5.1 mmol/L 4.0 3.9 3.8  Chloride 101 - 111 mmol/L 107 106 104  CO2 22 - 32 mmol/L _1 Calcium 8.9 - 10.3  mg/dL 9.0 9.3 9.0  Total Protein 6.5 - 8.1 g/dL 7.3 7.4 7.4  Total Bilirubin 0.3 - 1.2 mg/dL 0.8 0.8 0.7  Alkaline Phos 38 - 126 U/L 39 47 47  AST 15 - 41 U/L _2 ALT 14 - 54 U/L _3 PENDING LABS:    DIAGNOSTIC IMAGING:  *I have independently reviewed the images and agree with radiologic reports as listed below.  Bone scan: 03/04/17 CLINICAL DATA:  Recurrent stage III RIGHT breast cancer BRCA 1, restaging, generalized pain for 6 months, fell 4 months ago  EXAM: NUCLEAR MEDICINE WHOLE BODY BONE SCAN  TECHNIQUE: Whole body anterior and posterior images were obtained approximately 3 hours after intravenous injection of radiopharmaceutical.  RADIOPHARMACEUTICALS:  18.8 mCi Technetium-30mMDP IV  COMPARISON:  None  Radiographic correlation: CT abdomen and pelvis 10/24/2016  FINDINGS: Increased uptake at the lateral femoral condyles and extending up the lateral moped distal femoral metaphyses bilaterally.  Minimal uptake at the feet, LEFT wrist, LEFT elbow, and shoulders, typically degenerative.  Question abnormal increased uptake at the anterior LEFT sixth rib versus more likely and artifact from the adjacent breast prosthesis, with photopenic defects at the anterior chest wall bilaterally  greater on RIGHT from the prostheses.  Question asymmetric increased tracer localization at the anterior LEFT iliac bone versus RIGHT.  No additional sites of abnormal osseous tracer accumulation are identified.  Photopenic defect at the RIGHT chest wall  Otherwise expected urinary tract and soft tissue distribution of tracer.  IMPRESSION: Questionable sites of abnormal increased tracer accumulation at the anterior LEFT iliac bone and at the lateral aspects of the distal femora/knees bilaterally.  These sites are nonspecific.  The presence of BILATERAL uptake at the distal femora in Carly similar fashion suggests metabolic process or marrow  reconversion rather than metastases, though metastases are not completely excluded and radiographic correlation should be considered.   Electronically Signed   By: Mark  Boles M.D.   On: 03/04/2017 18:04   Skeletal survey: 03/12/17 CLINICAL DATA:  Breast cancer with bone pain  EXAM: METASTATIC BONE SURVEY  COMPARISON:  Bone scan 03/04/2017  FINDINGS: No lesion on the whole-body skeletal survey to suggest skeletal metastasis. Particular attention directed to the distal femurs and iliac bones.  IMPRESSION: No radiographic evidence skeletal metastasis.   Electronically Signed   By: Stewart  Edmunds M.D.   On: 03/12/2017 16:03    PATHOLOGY:     ASSESSMENT & PLAN:   Stage IIIB recurrent invasive ductal carcinoma of (R) breast, ER+/PR+/HER2-; BRCA1 +:  -History of DCIS (diagnosed in 2013). Recurrence diagnosed in 2016. Treated with bilateral mastectomies with (R) axillary lymph node dissection, followed by adjuvant chemo with Adriamycin/Cytoxan x 4, followed by adjuvant radiation therapy. Completed treatment on 01/27/16. Started Femara in 01/2016.   -At her last visit, reported worsening hot flashes and progressive bone pain. Femara was stopped and she was initially switched to Aromasin, but could not afford, so she was switched to Anastrozole.   -Remains on Anastrozole with good tolerance; plan to continue Anastrozole for 5-10 years.  -Clinical breast exam performed today and negative for recurrence.  -No role for mammogram given bilateral mastectomies with reconstruction.  No role for breast MRI as well; reviewed NCCN Guidelines with her that recommend screening imaging for women with BRCA1 positivity who elect not to have prophylactic bilateral mastectomies.  Discussed with Dr. Zhou. Since she has undergone risk reduction surgery with mastectomies, there is no benefit for obtaining screening breast imaging (either with mammogram or breast MRI).  Clinical breast exam and  patient's self-breast exams are invaluable for helping detect recurrence.  Patient agrees with this plan.  -Return to cancer center in 4 months; no labs necessary.   Arthralgias:  -Bone scan with questionable areas of activity on 03/04/17; skeletal survey done on 03/12/17 found no radiographic evidence of metastatic disease in the bone, which is reassuring.  We reviewed both of these scan results, as well as the diagnostic imaging together.   -Her joint pain is also slightly improved since changing from Femara to Anastrozole, which is encouraging.  Can obtain repeat bone imaging in the future, as clinically indicated.  Her chronic pain itself is managed by chronic pain specialist.   Bone health:  -Last DEXA scan 02/06/16 showed osteopenia with Carly -1.3.  -She understands that she is at increased risk for continued bone demineralization given history of oopherectomy and current aromatase inhibitor use.  -Continue Prolia injections every 6 months Oncology Flowsheet 12/12/2016  denosumab (PROLIA) [place] 60 mg    Anxiety about health:  -Provided emotional support through active listening, validation of her concerns, and expressive supportive counseling.  She struggles with fear of recurrence.  -Xanax recently refilled.  (  of note: this is the only controlled substance patient receives from the cancer center).  -She would probably benefit from support through behavioral health and counseling/therapy. We did not discuss today, but can consider recommending/referring in the future if she agrees.   Sinusitis:  -Symptoms consistent with acute sinusitis.   -E-scribed Z-pak to her pharmacy. Encouraged her to contact her PCP if her symptoms do not improve or worsen despite antibiotics. She agreed.      Dispo:  -Return to cancer center in 4 months for follow-up.    All questions were answered to patient's stated satisfaction. Encouraged patient to call with any new concerns or questions before her next  visit to the cancer center and we can certain see her sooner, if needed.    Plan of care discussed with Dr. Talbert Cage, who agrees with the above aforementioned.     Orders placed this encounter:  No orders of the defined types were placed in this encounter.     Mike Craze, NP [place] [phone]

## 2017-05-03 NOTE — Progress Notes (Signed)
Received refill request from pharmacy for Xanax.   [place] Controlled Substance Reporting System reviewed and refill is appropriate on or after 05/03/17. Paper prescription printed & post-dated; Rx printed and will ask nursing to fax to her pharmacy, if able.   NCCSRS reviewed:     Mike Craze, NP [place] [phone]

## 2017-05-07 ENCOUNTER — Encounter (HOSPITAL_COMMUNITY): Payer: BLUE CROSS/BLUE SHIELD | Attending: Hematology | Admitting: Adult Health

## 2017-05-07 ENCOUNTER — Other Ambulatory Visit: Payer: Self-pay

## 2017-05-07 ENCOUNTER — Encounter (HOSPITAL_COMMUNITY): Payer: Self-pay | Admitting: Adult Health

## 2017-05-07 VITALS — BP 123/85 | HR 87 | Temp 98.2°F | Resp 18 | Wt 281.3 lb

## 2017-05-07 DIAGNOSIS — J019 Acute sinusitis, unspecified: Secondary | ICD-10-CM

## 2017-05-07 DIAGNOSIS — M8580 Other specified disorders of bone density and structure, unspecified site: Secondary | ICD-10-CM | POA: Diagnosis not present

## 2017-05-07 DIAGNOSIS — D701 Agranulocytosis secondary to cancer chemotherapy: Secondary | ICD-10-CM | POA: Insufficient documentation

## 2017-05-07 DIAGNOSIS — I890 Lymphedema, not elsewhere classified: Secondary | ICD-10-CM | POA: Diagnosis not present

## 2017-05-07 DIAGNOSIS — M791 Myalgia, unspecified site: Secondary | ICD-10-CM | POA: Diagnosis not present

## 2017-05-07 DIAGNOSIS — N951 Menopausal and female climacteric states: Secondary | ICD-10-CM | POA: Diagnosis not present

## 2017-05-07 DIAGNOSIS — F419 Anxiety disorder, unspecified: Secondary | ICD-10-CM

## 2017-05-07 DIAGNOSIS — Z1502 Genetic susceptibility to malignant neoplasm of ovary: Secondary | ICD-10-CM | POA: Insufficient documentation

## 2017-05-07 DIAGNOSIS — Z1509 Genetic susceptibility to other malignant neoplasm: Secondary | ICD-10-CM

## 2017-05-07 DIAGNOSIS — C50911 Malignant neoplasm of unspecified site of right female breast: Secondary | ICD-10-CM | POA: Diagnosis not present

## 2017-05-07 DIAGNOSIS — Z1501 Genetic susceptibility to malignant neoplasm of breast: Secondary | ICD-10-CM | POA: Insufficient documentation

## 2017-05-07 DIAGNOSIS — T451X5A Adverse effect of antineoplastic and immunosuppressive drugs, initial encounter: Secondary | ICD-10-CM | POA: Insufficient documentation

## 2017-05-07 MED ORDER — AZITHROMYCIN 250 MG PO TABS: ORAL_TABLET | ORAL | 0 refills | Status: DC

## 2017-05-07 NOTE — Patient Instructions (Addendum)
[facility] Discharge Instructions  RECOMMENDATIONS MADE BY THE CONSULTANT AND ANY TEST RESULTS WILL BE SENT TO YOUR REFERRING PHYSICIAN.  You were seen today by Gretchen Dawson, NP Follow up in 4 months See schedulers up front for appointments   Thank you for choosing [facility] at [facility] to provide your oncology and hematology care.  To afford each patient quality time with our provider, please arrive at least 15 minutes before your scheduled appointment time.    If you have a lab appointment with the Cancer Center please come in thru the  Main Entrance and check in at the main information desk  You need to re-schedule your appointment should you arrive 10 or more minutes late.  We strive to give you quality time with our providers, and arriving late affects you and other patients whose appointments are after yours.  Also, if you no show three or more times for appointments you may be dismissed from the clinic at the providers discretion.     Again, thank you for choosing  Cancer Center.  Our hope is that these requests will decrease the amount of time that you wait before being seen by our physicians.       _____________________________________________________________  Should you have questions after your visit to  Cancer Center, please contact our office at [phone] between the hours of 8:30 a.m. and 4:30 p.m.  Voicemails left after 4:30 p.m. will not be returned until the following business day.  For prescription refill requests, have your pharmacy contact our office.       Resources For Cancer Patients and their Caregivers ? American Cancer Society: Can assist with transportation, wigs, general needs, runs Look Good Feel Better.        1-888-227-6333 ? Cancer Care: Provides financial assistance, online support groups, medication/co-pay assistance.  1-800-813-HOPE (4673) ? [facility] Assists [place] cancer patients and their families through emotional , educational and financial support.  336-427-4357 ? [place] DSS Where to apply for food stamps, Medicaid and utility assistance. 336-342-1394 ? [facility]: Transportation to medical appointments. 336-347-2287 ? Social Security Administration: May apply for disability if have a Stage IV cancer. 336-342-7796 1-800-772-1213 ? [place] Aging, Disability and Transit Services: Assists with nutrition, care and transit needs. 336-349-2343  Cancer Center Support Programs: @[email or web address]@ > Cancer Support Group  2nd Tuesday of the month 1pm-2pm, Journey Room  > Creative Journey  3rd Tuesday of the month 1130am-1pm, Journey Room  > Look Good Feel Better  1st Wednesday of the month 10am-12 noon, Journey Room (Call American Cancer Society to register 1-800-395-5775)    

## 2017-06-13 ENCOUNTER — Other Ambulatory Visit (HOSPITAL_COMMUNITY): Payer: Medicaid Other

## 2017-06-13 ENCOUNTER — Ambulatory Visit (HOSPITAL_COMMUNITY): Payer: Medicaid Other

## 2017-06-14 ENCOUNTER — Other Ambulatory Visit (HOSPITAL_COMMUNITY): Payer: Self-pay | Admitting: Adult Health

## 2017-06-14 ENCOUNTER — Encounter (HOSPITAL_COMMUNITY): Payer: Self-pay | Admitting: Adult Health

## 2017-06-14 DIAGNOSIS — C50911 Malignant neoplasm of unspecified site of right female breast: Secondary | ICD-10-CM

## 2017-06-14 MED ORDER — ALPRAZOLAM 0.25 MG PO TABS: 0.2500 mg | ORAL_TABLET | Freq: Three times a day (TID) | ORAL | 1 refills | Status: DC | PRN

## 2017-06-14 NOTE — Progress Notes (Signed)
Received refill request from pharmacy for Xanax.   [place] Controlled Substance Reporting System reviewed and refill is appropriate on or after 06/14/17. Paper prescription printed & post-dated; will ask nursing to fax Rx to pt's pharmacy.   NCCSRS reviewed:     Mike Craze, NP [place] [phone]

## 2017-07-01 ENCOUNTER — Encounter (HOSPITAL_COMMUNITY): Payer: Self-pay | Admitting: *Deleted

## 2017-07-01 NOTE — Progress Notes (Signed)
[employer] company requested office notes and radiology studies from March 01, 2017 - present.  These were faxed over to them today per their secure fax number.

## 2017-07-15 ENCOUNTER — Other Ambulatory Visit (HOSPITAL_COMMUNITY): Payer: Self-pay | Admitting: Adult Health

## 2017-07-15 DIAGNOSIS — C50911 Malignant neoplasm of unspecified site of right female breast: Secondary | ICD-10-CM

## 2017-07-16 ENCOUNTER — Other Ambulatory Visit (HOSPITAL_COMMUNITY): Payer: Self-pay | Admitting: Adult Health

## 2017-07-16 ENCOUNTER — Encounter (HOSPITAL_COMMUNITY): Payer: Self-pay | Admitting: Adult Health

## 2017-07-16 NOTE — Progress Notes (Signed)
Received refill request from pt's pharmacy for Xanax.   [place] Controlled Substance Reporting System reviewed and refill is appropriate on or after 07/16/17. Medication e-scribed to his pharmacy using [employer]'s 2-step verification process.    NCCSRS reviewed:     Mike Craze, NP [place] [phone]

## 2017-08-14 ENCOUNTER — Other Ambulatory Visit (HOSPITAL_COMMUNITY): Payer: Self-pay | Admitting: Adult Health

## 2017-08-14 DIAGNOSIS — Z1509 Genetic susceptibility to other malignant neoplasm: Secondary | ICD-10-CM

## 2017-08-14 DIAGNOSIS — C50911 Malignant neoplasm of unspecified site of right female breast: Secondary | ICD-10-CM

## 2017-08-14 DIAGNOSIS — Z1501 Genetic susceptibility to malignant neoplasm of breast: Secondary | ICD-10-CM

## 2017-08-29 ENCOUNTER — Encounter (HOSPITAL_COMMUNITY): Payer: Self-pay

## 2017-09-04 ENCOUNTER — Other Ambulatory Visit (HOSPITAL_COMMUNITY): Payer: Self-pay | Admitting: Adult Health

## 2017-09-04 ENCOUNTER — Inpatient Hospital Stay (HOSPITAL_COMMUNITY): Payer: BLUE CROSS/BLUE SHIELD | Admitting: Adult Health

## 2017-09-04 DIAGNOSIS — C50911 Malignant neoplasm of unspecified site of right female breast: Secondary | ICD-10-CM

## 2017-09-04 NOTE — Progress Notes (Unsigned)
 [facility] [address] 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 Appel of palpable mass, lower outer quadrant of reconstructed R breast close to nipple ER+ 97%, PR+ 70 (weak) HER 2 - by IHC      07/06/2015 Surgery    Definitive lumpectomy and LN dissection performed revealing a 3.1 cm primary 1/14 positive LN with a macro metastases, satellite nodules adjacent to the primary, LVI-, however she had skin invasion along with satellite nodules, T4b N1a, Stage IIIB, grade IIII,      07/07/2015 Imaging    Per records negative CT C/A/P and bone scan      08/15/2015 - 11/22/2015 Chemotherapy    AC x 4, Taxol X 4 both given dose dense       - 01/27/2016 Radiation Therapy    Dr. Squire/Dr. Wentworth      02/06/2016 Imaging    Bone density- BMD as determined from [facility] L1-L4 is 1.021 g/cm2 with a T-Score of -1.3. This patient is considered osteopenic according to World Health Organization (WHO) criteria.         HISTORY OF PRESENT ILLNESS:  (From Dr. Zhou's note on 10/25/16)  Recurrent breast cancer (HCC) BRCA1+, Stage III invasive adenocarcinoma right breast, ER/PR+ HER2 NEGATIVE, diagnosed in 2016 treated with definitive lumpectomy and LN dissection on 07/06/2015 followed by AC x 4 then T x 4 in dose dense fashion (08/15/2015- 11/22/2015) by Dr. Neijstrom, followed by XRT finishing on 01/27/2016. She was prescribed Femara. AND Stage I DCIS BRCA+ diagnosed in 2013; double mastectomy in [place], reconstruction with Dr. Appel AND Bilateral  mastectomy, Hysterectomy/oophorectomy HX DVT BRCA1 positivity Chemotherapy induced neuropathy    INTERVAL HISTORY:  Carly [name] 36 y.o. female returns for routine follow-up for history of recurrent breast cancer and osteopenia.   Overall, she tells me she has been "doing okay." Appetite 100%; energy levels 75%.  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  She underwent bone scan and skeletal survey X-ray since her last visit; she received the results via phone, but would like to review these results again together today.   Started Anastrozole ~2 months ago. She feels like she has seen a "slight improvement" in her bone pain/arthralgias since changing from Femara to Arimidex.  She continues to have hot flashes, which are manageable.  Her leg pain is worse with colder weather, "so it's been really bad lately."  Heat helps her pain.   Denies any changes to her chest/breasts. No new lumps/bumps or skin changes.  She is seeing PT for her (R) arm lymphedema; she is excited to share with me that she has been approved for ~30 treatments for her lymphedema (instead of previously approved 2 visits with her Medicaid).    Her anxiety remains apparent; she takes Xanax PRN which is helpful.  She just found out her boyfriend cheated on her last week and she has since had him move out of her home.  She is caring for a newborn, who was   apparently born addicted to heroin and nicotine; he is the child of someone in her family "and I didn't want him to go into the system" so she is caring for him.  Ms. Soderberg's daughter is pregnant with her grandson and is due in ~6 weeks.  She is looking forward to him being born.  She is anxiously anticipating her disability hearing in early December as well.   Shares with me that she recently saw her GYN for routine exam; she claims to still have her uterus and cervix (our medical records indicate that she had hysterectomy).  Denies  any vaginal bleeding.   She wants to talk about our recommendations in terms of obtaining mammogram and/or breast MRI since she is BRCA1 positive, "even though I've had mastectomies with reconstruction." Reports that her GYN encouraged her to talk to us about this at follow-up.   She has some sinus drainage that started ~5 days ago. Endorses yellow/green output from her nose and stuffy nose/congestion. She is requesting a Rx for Z-pak which has been helpful for her in the past.      REVIEW OF SYSTEMS:  Review of Systems  Constitutional: Positive for fatigue. Negative for chills and fever.  HENT:         Nasal congestion and drainage   Eyes: Negative.   Respiratory: Negative.  Negative for cough and shortness of breath.   Cardiovascular: Positive for leg swelling (chronic).  Gastrointestinal: Negative.  Negative for abdominal pain, blood in stool, constipation, diarrhea, nausea and vomiting.  Endocrine: Positive for hot flashes.  Genitourinary: Negative.  Negative for dysuria, hematuria and vaginal bleeding.   Musculoskeletal: Positive for arthralgias.       (R) arm lymphedema   Skin: Negative.  Negative for rash.  Neurological: Positive for numbness.  Hematological: Negative.   Psychiatric/Behavioral: The patient is nervous/anxious.      PAST MEDICAL/SURGICAL HISTORY:  Past Medical History:  Diagnosis Date  . Breast cancer (HCC) 2013   BRCA1 positive   Past Surgical History:  Procedure Laterality Date  . ABDOMINAL HYSTERECTOMY    . CESAREAN SECTION    . MASTECTOMY       SOCIAL HISTORY:  Social History   Socioeconomic History  . Marital status: Single    Spouse name: Not on file  . Number of children: Not on file  . Years of education: Not on file  . Highest education level: Not on file  Social Needs  . Financial resource strain: Not on file  . Food insecurity - worry: Not on file  . Food insecurity - inability: Not on file  . Transportation needs - medical: Not  on file  . Transportation needs - non-medical: Not on file  Occupational History  . Not on file  Tobacco Use  . Smoking status: Never Smoker  . Smokeless tobacco: Never Used  Substance and Sexual Activity  . Alcohol use: No  . Drug use: No  . Sexual activity: Not on file  Other Topics Concern  . Not on file  Social History Narrative  . Not on file    FAMILY HISTORY:  Family History  Problem Relation Age of Onset  . Breast cancer Mother 48  . BRCA 1/2 Mother   . Prostate cancer Father        metastatic  . Breast cancer Sister 30       BRCA1 mutation  . Breast cancer Maternal Aunt        dx in her 40s; BRCA1   mutation  . Heart attack Maternal Grandfather   . Ovarian cancer Paternal Grandmother        dx in her 24s  . Heart attack Paternal Grandfather   . Healthy Sister   . BRCA 1/2 Maternal Aunt   . Healthy Maternal Aunt        negative for a BRCA mutation  . Healthy Maternal Aunt        negative for a BRCA mutation    CURRENT MEDICATIONS:  Outpatient Encounter Medications as of 09/04/2017  Medication Sig Note  . ALPRAZolam (XANAX) 0.25 MG tablet TAKE ONE TABLET BY MOUTH THREE TIMES DAILY AS NEEDED FOR ANXIETY   . amitriptyline (ELAVIL) 25 MG tablet    . anastrozole (ARIMIDEX) 1 MG tablet TAKE ONE TABLET BY MOUTH DAILY   . azithromycin (ZITHROMAX) 250 MG tablet Take 2 tabs po on day 1. Then take 1 tab po daily x 4 days.   . cyclobenzaprine (FLEXERIL) 5 MG tablet Take 5 mg by mouth 3 (three) times daily as needed.  01/26/2016: Received from: [place]: Take one tablet (5 mg total) by mouth every 4 (four) hours as needed for Muscle spasms.  Marland Kitchen docusate sodium (COLACE) 100 MG capsule Take by mouth. 01/26/2016: Received from: [place]: Take one capsule (100 mg total) by mouth 2 (two) times daily.  . DULoxetine (CYMBALTA) 30 MG capsule    . DULoxetine (CYMBALTA) 60 MG capsule Take 1 capsule (60 mg total) by mouth daily. 12/12/2016: 6m daily  .  gabapentin (NEURONTIN) 600 MG tablet Take 900 mg 2 (two) times daily by mouth. Take 900 mg twice daily, take 600 mg at noon   . HYDROcodone-acetaminophen (NORCO) 10-325 MG tablet Take 1 tablet by mouth every 4 (four) hours as needed. (Patient taking differently: Take 1 tablet by mouth every 6 (six) hours as needed. )   . ibuprofen (ADVIL,MOTRIN) 200 MG tablet Take by mouth. 01/26/2016: Received from: [place] Take 200 mg by mouth as needed for Pain.  .Marland KitchenLORazepam (ATIVAN) 1 MG tablet Take 1 mg by mouth.   . Misc. Devices MISC Please provide patient with a compression therapy pump fot her Right arm lymphedema.   . Multiple Vitamins-Minerals (MULTIVITAMIN ADULT PO) Take 1 tablet daily by mouth.   . ondansetron (ZOFRAN) 4 MG tablet TAKE ONE TABLET BY MOUTH EVERY 6 HOURS AS NEEDED FOR NAUSEA 01/26/2016: Received from: [place] . pantoprazole (PROTONIX) 40 MG tablet Take 1 tablet (40 mg total) by mouth daily.   . phentermine (ADIPEX-P) 37.5 MG tablet Take 37.5 mg by mouth daily.   . prochlorperazine (COMPAZINE) 10 MG tablet Take by mouth. 01/26/2016: Received from: [place] Take 10 mg by mouth every 6 (six) hours as needed.  . promethazine (PHENERGAN) 12.5 MG tablet Take by mouth. 01/26/2016: Received from: [place] Take 12.5 mg by mouth every 6 (six) hours as needed for Nausea.  . valACYclovir (VALTREX) 1000 MG tablet Take 2 tablets 2 (two) times daily by mouth.    No facility-administered encounter medications on file as of 09/04/2017.     ALLERGIES:  Allergies  Allergen Reactions  . Sumatriptan Other (See Comments)    Other reaction(s): Unknown Numbness of face and arms     PHYSICAL EXAM:  ECOG Performance status: 1 - 2 - Symptomatic; remains largely independent.   There were no vitals filed for this visit. There were no vitals filed for this visit.  Physical Exam  Constitutional: She is oriented to person, place, and time and  well-developed, well-nourished, and in no distress.  HENT:  Head: Normocephalic.  Mouth/Throat: Oropharynx is clear and moist. No oropharyngeal exudate.  Eyes: Conjunctivae are normal. Pupils are equal, round, and reactive to light. No scleral icterus.  Neck: Normal range of motion. Neck supple.  Cardiovascular: Normal rate and regular rhythm.  Pulmonary/Chest: Effort normal and breath sounds normal. No respiratory distress. She has no wheezes.  Abdominal: Soft. Bowel sounds are normal. There is no tenderness.  Musculoskeletal: Normal range of motion. She exhibits edema.  Significant (R) arm lymphedema   Lymphadenopathy:    She has no cervical adenopathy.       Right: No supraclavicular adenopathy present.       Left: No supraclavicular adenopathy present.  Neurological: She is alert and oriented to person, place, and time. No cranial nerve deficit. Gait normal.  Skin: Skin is warm and dry. No rash noted.  Psychiatric: Mood, memory, affect and judgment normal.  Nursing note and vitals reviewed.    LABORATORY DATA:  I have reviewed the labs as listed.  CBC    Component Value Date/Time   WBC 3.8 (L) 02/28/2017 1247   RBC 3.99 02/28/2017 1247   HGB 12.2 02/28/2017 1247   HCT 35.3 (L) 02/28/2017 1247   PLT 123 (L) 02/28/2017 1247   MCV 88.5 02/28/2017 1247   MCH 30.6 02/28/2017 1247   MCHC 34.6 02/28/2017 1247   RDW 12.8 02/28/2017 1247   LYMPHSABS 1.4 02/28/2017 1247   MONOABS 0.3 02/28/2017 1247   EOSABS 0.1 02/28/2017 1247   BASOSABS 0.0 02/28/2017 1247   CMP Latest Ref Rng & Units 02/28/2017 12/12/2016 09/12/2016  Glucose 65 - 99 mg/dL 104(H) 92 100(H)  BUN 6 - 20 mg/dL _0 Creatinine 0.44 - 1.00 mg/dL 0.59 0.75 0.69  Sodium 135 - 145 mmol/L 140 140 137  Potassium 3.5 - 5.1 mmol/L 4.0 3.9 3.8  Chloride 101 - 111 mmol/L 107 106 104  CO2 22 - 32 mmol/L _1 Calcium 8.9 - 10.3 mg/dL 9.0 9.3 9.0  Total Protein 6.5 - 8.1 g/dL 7.3 7.4 7.4  Total Bilirubin 0.3 - 1.2  mg/dL 0.8 0.8 0.7  Alkaline Phos 38 - 126 U/L 39 47 47  AST 15 - 41 U/L _2 ALT 14 - 54 U/L _3 PENDING LABS:    DIAGNOSTIC IMAGING:  *I have independently reviewed the images and agree with radiologic reports as listed below.  Bone scan: 03/04/17 CLINICAL DATA:  Recurrent stage III RIGHT breast cancer BRCA 1, restaging, generalized pain for 6 months, fell 4 months ago  EXAM: NUCLEAR MEDICINE WHOLE BODY BONE SCAN  TECHNIQUE: Whole body anterior and posterior images were obtained approximately 3 hours after intravenous injection of radiopharmaceutical.  RADIOPHARMACEUTICALS:  18.8 mCi Technetium-52mMDP IV  COMPARISON:  None  Radiographic correlation: CT abdomen and pelvis 10/24/2016  FINDINGS: Increased uptake at the lateral femoral condyles and extending up the lateral moped distal femoral metaphyses bilaterally.  Minimal uptake at the feet, LEFT wrist, LEFT elbow, and shoulders, typically degenerative.  Question abnormal increased uptake at the anterior LEFT sixth rib versus more likely and artifact from the adjacent breast prosthesis, with photopenic defects at the anterior chest wall bilaterally greater on RIGHT from the prostheses.  Question asymmetric increased tracer localization at the anterior LEFT iliac bone versus RIGHT.  No additional sites of abnormal  osseous tracer accumulation are identified.  Photopenic defect at the RIGHT chest wall  Otherwise expected urinary tract and soft tissue distribution of tracer.  IMPRESSION: Questionable sites of abnormal increased tracer accumulation at the anterior LEFT iliac bone and at the lateral aspects of the distal femora/knees bilaterally.  These sites are nonspecific.  The presence of BILATERAL uptake at the distal femora in a similar fashion suggests metabolic process or marrow reconversion rather than metastases, though metastases are not completely excluded  and radiographic correlation should be considered.   Electronically Signed   By: Lavonia Dana M.D.   On: 03/04/2017 18:04   Skeletal survey: 03/12/17 CLINICAL DATA:  Breast cancer with bone pain  EXAM: METASTATIC BONE SURVEY  COMPARISON:  Bone scan 03/04/2017  FINDINGS: No lesion on the whole-body skeletal survey to suggest skeletal metastasis. Particular attention directed to the distal femurs and iliac bones.  IMPRESSION: No radiographic evidence skeletal metastasis.   Electronically Signed   By: Suzy Bouchard M.D.   On: 03/12/2017 16:03    PATHOLOGY:     ASSESSMENT & PLAN:   Stage IIIB recurrent invasive ductal carcinoma of (R) breast, ER+/PR+/HER2-; BRCA1 +:  -History of DCIS (diagnosed in 2013). Recurrence diagnosed in 2016. Treated with bilateral mastectomies with (R) axillary lymph node dissection, followed by adjuvant chemo with Adriamycin/Cytoxan x 4, followed by adjuvant radiation therapy. Completed treatment on 01/27/16. Started Femara in 01/2016.   -At her last visit, reported worsening hot flashes and progressive bone pain. Femara was stopped and she was initially switched to Aromasin, but could not afford, so she was switched to Anastrozole.   -Remains on Anastrozole with good tolerance; plan to continue Anastrozole for 5-10 years.  -Clinical breast exam performed today and negative for recurrence.  -No role for mammogram given bilateral mastectomies with reconstruction.  No role for breast MRI as well; reviewed NCCN Guidelines with her that recommend screening imaging for women with BRCA1 positivity who elect not to have prophylactic bilateral mastectomies.  Discussed with Dr. Talbert Cage. Since she has undergone risk reduction surgery with mastectomies, there is no benefit for obtaining screening breast imaging (either with mammogram or breast MRI).  Clinical breast exam and patient's self-breast exams are invaluable for helping detect recurrence.  Patient  agrees with this plan.  -Return to cancer center in 4 months; no labs necessary.   Arthralgias:  -Bone scan with questionable areas of activity on 03/04/17; skeletal survey done on 03/12/17 found no radiographic evidence of metastatic disease in the bone, which is reassuring.  We reviewed both of these scan results, as well as the diagnostic imaging together.   -Her joint pain is also slightly improved since changing from Femara to Anastrozole, which is encouraging.  Can obtain repeat bone imaging in the future, as clinically indicated.  Her chronic pain itself is managed by chronic pain specialist.   Bone health:  -Last DEXA scan 02/06/16 showed osteopenia with T-score -1.3.  -She understands that she is at increased risk for continued bone demineralization given history of oopherectomy and current aromatase inhibitor use.  -Continue Prolia injections every 6 months Oncology Flowsheet 12/12/2016  denosumab (PROLIA) [place] 60 mg    Anxiety about health:  -Provided emotional support through active listening, validation of her concerns, and expressive supportive counseling.  She struggles with fear of recurrence.  -Xanax recently refilled.  (of note: this is the only controlled substance patient receives from the cancer center).  -She would probably benefit from support through behavioral health and  counseling/therapy. We did not discuss today, but can consider recommending/referring in the future if she agrees.   Sinusitis:  -Symptoms consistent with acute sinusitis.   -E-scribed Z-pak to her pharmacy. Encouraged her to contact her PCP if her symptoms do not improve or worsen despite antibiotics. She agreed.      Dispo:  -Return to cancer center in 4 months for follow-up.    All questions were answered to patient's stated satisfaction. Encouraged patient to call with any new concerns or questions before her next visit to the cancer center and we can certain see her sooner, if needed.    Plan  of care discussed with Dr. Talbert Cage, who agrees with the above aforementioned.     Orders placed this encounter:  No orders of the defined types were placed in this encounter.     Mike Craze, NP [place] [phone]

## 2017-09-06 ENCOUNTER — Encounter (HOSPITAL_COMMUNITY): Payer: Self-pay | Admitting: Adult Health

## 2017-09-06 NOTE — Progress Notes (Signed)
Received refill request from pharmacy for Xanax.   [place] Controlled Substance Reporting System reviewed and refill is appropriate on or after 09/06/17. Medication e-scribed to her pharmacy ([facility]) using [employer] 2-step verification process.    NCCSRS reviewed:     Mike Craze, NP [place] [phone]

## 2017-09-10 ENCOUNTER — Ambulatory Visit (HOSPITAL_COMMUNITY): Payer: BLUE CROSS/BLUE SHIELD | Admitting: Adult Health

## 2017-09-23 ENCOUNTER — Other Ambulatory Visit (HOSPITAL_COMMUNITY): Payer: Self-pay | Admitting: Adult Health

## 2017-09-23 DIAGNOSIS — C50911 Malignant neoplasm of unspecified site of right female breast: Secondary | ICD-10-CM

## 2017-09-24 ENCOUNTER — Encounter (HOSPITAL_COMMUNITY): Payer: Self-pay | Admitting: Adult Health

## 2017-09-24 NOTE — Progress Notes (Signed)
Received refill request from pharmacy for Xanax.   [place] Controlled Substance Reporting System reviewed and refill is appropriate on or after 09/24/17. Medication e-scribed to her pharmacy ([facility]) using [employer] 2-step verification process.    NCCSRS reviewed:     Mike Craze, NP [place] [phone]

## 2017-09-26 ENCOUNTER — Ambulatory Visit (HOSPITAL_COMMUNITY): Payer: BLUE CROSS/BLUE SHIELD | Admitting: Hematology

## 2017-10-03 ENCOUNTER — Ambulatory Visit (HOSPITAL_COMMUNITY): Payer: BLUE CROSS/BLUE SHIELD | Admitting: Hematology

## 2017-10-21 ENCOUNTER — Encounter (HOSPITAL_COMMUNITY): Payer: Self-pay | Admitting: Hematology

## 2017-10-21 ENCOUNTER — Other Ambulatory Visit: Payer: Self-pay

## 2017-10-21 ENCOUNTER — Inpatient Hospital Stay (HOSPITAL_COMMUNITY): Payer: BLUE CROSS/BLUE SHIELD | Attending: Hematology | Admitting: Hematology

## 2017-10-21 VITALS — BP 125/75 | HR 88 | Temp 98.7°F | Resp 18 | Wt 277.6 lb

## 2017-10-21 DIAGNOSIS — C50911 Malignant neoplasm of unspecified site of right female breast: Secondary | ICD-10-CM

## 2017-10-21 DIAGNOSIS — Z8541 Personal history of malignant neoplasm of cervix uteri: Secondary | ICD-10-CM | POA: Insufficient documentation

## 2017-10-21 DIAGNOSIS — Z803 Family history of malignant neoplasm of breast: Secondary | ICD-10-CM | POA: Diagnosis not present

## 2017-10-21 DIAGNOSIS — Z9221 Personal history of antineoplastic chemotherapy: Secondary | ICD-10-CM | POA: Insufficient documentation

## 2017-10-21 DIAGNOSIS — Z923 Personal history of irradiation: Secondary | ICD-10-CM | POA: Insufficient documentation

## 2017-10-21 DIAGNOSIS — G629 Polyneuropathy, unspecified: Secondary | ICD-10-CM | POA: Diagnosis not present

## 2017-10-21 DIAGNOSIS — Z79811 Long term (current) use of aromatase inhibitors: Secondary | ICD-10-CM | POA: Diagnosis not present

## 2017-10-21 DIAGNOSIS — Z79899 Other long term (current) drug therapy: Secondary | ICD-10-CM | POA: Insufficient documentation

## 2017-10-21 DIAGNOSIS — M8580 Other specified disorders of bone density and structure, unspecified site: Secondary | ICD-10-CM | POA: Insufficient documentation

## 2017-10-21 DIAGNOSIS — C50511 Malignant neoplasm of lower-outer quadrant of right female breast: Secondary | ICD-10-CM | POA: Diagnosis not present

## 2017-10-21 DIAGNOSIS — Z170 Estrogen receptor positive status [ER+]: Secondary | ICD-10-CM | POA: Diagnosis not present

## 2017-10-21 DIAGNOSIS — F419 Anxiety disorder, unspecified: Secondary | ICD-10-CM | POA: Diagnosis not present

## 2017-10-21 NOTE — Patient Instructions (Signed)
[facility] at [facility] Discharge Instructions  Today you saw Dr. K.   Thank you for choosing [facility] at [facility] to provide your oncology and hematology care.  To afford each patient quality time with our provider, please arrive at least 15 minutes before your scheduled appointment time.   If you have a lab appointment with the Cancer Center please come in thru the  Main Entrance and check in at the main information desk  You need to re-schedule your appointment should you arrive 10 or more minutes late.  We strive to give you quality time with our providers, and arriving late affects you and other patients whose appointments are after yours.  Also, if you no show three or more times for appointments you may be dismissed from the clinic at the providers discretion.     Again, thank you for choosing [facility].  Our hope is that these requests will decrease the amount of time that you wait before being seen by our physicians.       _____________________________________________________________  Should you have questions after your visit to [facility], please contact our office at [phone] between the hours of 8:30 a.m. and 4:30 p.m.  Voicemails left after 4:30 p.m. will not be returned until the following business day.  For prescription refill requests, have your pharmacy contact our office.       Resources For Cancer Patients and their Caregivers ? American Cancer Society: Can assist with transportation, wigs, general needs, runs Look Good Feel Better.        1-888-227-6333 ? Cancer Care: Provides financial assistance, online support groups, medication/co-pay assistance.  1-800-813-HOPE (4673) ? [facility] Assists [place] cancer patients and their families through emotional , educational and financial support.  336-427-4357 ? [place] DSS Where to apply for food  stamps, Medicaid and utility assistance. 336-342-1394 ? [facility]: Transportation to medical appointments. 336-347-2287 ? Social Security Administration: May apply for disability if have a Stage IV cancer. 336-342-7796 1-800-772-1213 ? [place] Aging, Disability and Transit Services: Assists with nutrition, care and transit needs. 336-349-2343  Cancer Center Support Programs:   > Cancer Support Group  2nd Tuesday of the month 1pm-2pm, Journey Room   > Creative Journey  3rd Tuesday of the month 1130am-1pm, Journey Room    

## 2017-10-21 NOTE — Assessment & Plan Note (Signed)
1.  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  Her joint pains are better than when she was taking letrozole.  She denies any new onset pains.  She underwent hysterectomy in 2016 prophylactically for her BRCA1 positivity. -Family history significant for mother who had breast cancer at age 66, sister who had breast cancer at 63, maternal aunt at age 5, father died of metastatic prostate cancer, paternal grandmother had metastatic cervical cancer.  2.  Osteopenia: Last bone density test in August 2017 shows osteopenia.  She is taking calcium and vitamin D twice daily.  She was started on Prolia every 6 months and received 2 doses on 06/14/2016 and 12/12/2016.  She missed her dose in December 2018 because of lack of insurance.  She is ready to proceed with her next injection today.  I plan to repeat bone density test in 6 months.  3.  Anxiety: She takes Xanax 0.25 mg as needed for anxiety.  4.  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  She is on Vicodin 10 mg every 4 hours as needed.  She takes 2 tablets 3 times a day.  She also takes Neurontin 600 mg 1-1/2 tablet in the morning, 1 tablet at noon, 1-1/2 tablet at bedtime.  She is also taking Cymbalta 90 mg daily.

## 2017-10-21 NOTE — Progress Notes (Signed)
Patient Care Team: [name], MD as PCP - General (Internal Medicine)  DIAGNOSIS:  Encounter Diagnoses  Name Primary?  . Recurrent malignant neoplasm of right breast ([place]) Yes  . Osteopenia, unspecified location   . Malignant neoplasm of lower-outer quadrant of right breast of female, estrogen receptor positive ([place])     SUMMARY OF ONCOLOGIC HISTORY:   Recurrent breast cancer ([name])   05/06/2015 Initial Biopsy    Incisional biopsy with Dr. Maryclare Labrador of palpable mass, lower outer quadrant of reconstructed R breast close to nipple ER+ 97%, PR+ 70 (weak) HER 2 - by [facility]      07/06/2015 Surgery    Definitive lumpectomy and LN dissection performed revealing a 3.1 cm primary 1/14 positive LN with a macro metastases, satellite nodules adjacent to the primary, LVI-, however she had skin invasion along with satellite nodules, T4b N1a, Stage IIIB, grade IIII,      07/07/2015 Imaging    Per records negative CT C/A/P and bone scan      08/15/2015 - 11/22/2015 Chemotherapy    AC x 4, Taxol X 4 both given dose dense       - 01/27/2016 Radiation Therapy    Dr. Harlow Ohms. Pablo Ledger      02/06/2016 Imaging    Bone density- BMD as determined from [facility] L1-L4 is 1.021 g/cm2 with a T-Score of -1.3. This patient is considered osteopenic according to [facility]) criteria.        CHIEF COMPLIANT: Follow-up of breast cancer and osteopenia.  INTERVAL HISTORY: Carly [name] is a 37 year old female who is seen in follow up for her breast cancer. She denies any new onset pains. She is tolerating anastrazole without any hot flashes. She does report some joint pains, but they have significantly improved since she switched to anastrazole from femara, late last year. Denies any fevers, nightsweats and weight loss. Denies any infections or hospitalizations. Reports that she takes calcium and vitamin d. Uses Xanax as needed for anxiety.  REVIEW OF SYSTEMS:   Constitutional: Denies  fevers, chills or abnormal weight loss Eyes: Denies blurriness of vision Ears, nose, mouth, throat, and face: Denies mucositis or sore throat Respiratory: Denies cough, dyspnea or wheezes Cardiovascular: Denies palpitation, chest discomfort Gastrointestinal:  Denies nausea, heartburn or change in bowel habits Skin: Denies abnormal skin rashes Lymphatics: Denies new lymphadenopathy or easy bruising Neurological:Denies any new weaknesses Behavioral/Psych: Mood is stable, no new changes  Extremities: No lower extremity edema. Pains in joints and neuropathy is stable. Breast:  denies any pain or lumps or nodules in either breasts All other systems were reviewed with the patient and are negative.  I have reviewed the past medical history, past surgical history, social history and family history with the patient and they are unchanged from previous note.  ALLERGIES:  is allergic to sumatriptan.  MEDICATIONS:  Current Outpatient Medications  Medication Sig Dispense Refill  . ALPRAZolam (XANAX) 0.25 MG tablet TAKE ONE TABLET BY MOUTH THREE TIMES DAILY AS NEEDED FOR ANXIETY 50 tablet 2  . amitriptyline (ELAVIL) 25 MG tablet   0  . anastrozole (ARIMIDEX) 1 MG tablet TAKE ONE TABLET BY MOUTH DAILY 30 tablet 0  . azithromycin (ZITHROMAX) 250 MG tablet Take 2 tabs po on day 1. Then take 1 tab po daily x 4 days. 6 each 0  . cyclobenzaprine (FLEXERIL) 5 MG tablet Take 5 mg by mouth 3 (three) times daily as needed.     . docusate  sodium (COLACE) 100 MG capsule Take by mouth.    . DULoxetine (CYMBALTA) 30 MG capsule   1  . DULoxetine (CYMBALTA) 60 MG capsule Take 1 capsule (60 mg total) by mouth daily. 90 capsule 3  . gabapentin (NEURONTIN) 600 MG tablet Take 900 mg 2 (two) times daily by mouth. Take 900 mg twice daily, take 600 mg at noon  1  . HYDROcodone-acetaminophen (NORCO) 10-325 MG tablet Take 1 tablet by mouth every 4 (four) hours as needed. (Patient taking differently: Take 1 tablet by mouth  every 6 (six) hours as needed. ) 10 tablet 0  . ibuprofen (ADVIL,MOTRIN) 200 MG tablet Take by mouth.    Marland Kitchen LORazepam (ATIVAN) 1 MG tablet Take 1 mg by mouth.    . Misc. Devices MISC Please provide patient with a compression therapy pump fot her Right arm lymphedema. 1 each 0  . Multiple Vitamins-Minerals (MULTIVITAMIN ADULT PO) Take 1 tablet daily by mouth.    . ondansetron (ZOFRAN) 4 MG tablet TAKE ONE TABLET BY MOUTH EVERY 6 HOURS AS NEEDED FOR NAUSEA    . pantoprazole (PROTONIX) 40 MG tablet Take 1 tablet (40 mg total) by mouth daily. 90 tablet 3  . phentermine (ADIPEX-P) 37.5 MG tablet Take 37.5 mg by mouth daily.  0  . prochlorperazine (COMPAZINE) 10 MG tablet Take by mouth.    . promethazine (PHENERGAN) 12.5 MG tablet Take by mouth.    . valACYclovir (VALTREX) 1000 MG tablet Take 2 tablets 2 (two) times daily by mouth.  1   No current facility-administered medications for this visit.     PHYSICAL EXAMINATION: ECOG PERFORMANCE STATUS: 1 - Symptomatic but completely ambulatory  Vitals:   10/21/17 1512  BP: 125/75  Pulse: 88  Resp: 18  Temp: 98.7 F (37.1 C)  SpO2: 100%   Filed Weights   10/21/17 1512  Weight: 277 lb 9.6 oz (125.9 kg)    GENERAL:alert, no distress and comfortable SKIN: skin color, texture, turgor are normal, no rashes or significant lesions EYES: normal, Conjunctiva are pink and non-injected, sclera clear OROPHARYNX:no mucositis, no erythema and lips, buccal mucosa, and tongue normal  NECK: supple, thyroid normal size, non-tender, without nodularity LYMPH:  no palpable lymphadenopathy in the cervical, axillary or inguinal LUNGS: clear to auscultation and percussion with normal breathing effort HEART: regular rate & rhythm and no murmurs and no lower extremity edema ABDOMEN:abdomen soft, non-tender and normal bowel sounds MUSCULOSKELETAL:no cyanosis of digits and no clubbing   EXTREMITIES: No lower extremity edema BREAST: No palpable masses or nodules in  either right or left reconstructed breasts. No palpable axillary supraclavicular or infraclavicular adenopathy no breast tenderness or nipple discharge.  LABORATORY DATA:  I have reviewed the data as listed CMP Latest Ref Rng & Units 02/28/2017 12/12/2016 09/12/2016  Glucose 65 - 99 mg/dL 104(H) 92 100(H)  BUN 6 - 20 mg/dL _0 Creatinine 0.44 - 1.00 mg/dL 0.59 0.75 0.69  Sodium 135 - 145 mmol/L 140 140 137  Potassium 3.5 - 5.1 mmol/L 4.0 3.9 3.8  Chloride 101 - 111 mmol/L 107 106 104  CO2 22 - 32 mmol/L _1 Calcium 8.9 - 10.3 mg/dL 9.0 9.3 9.0  Total Protein 6.5 - 8.1 g/dL 7.3 7.4 7.4  Total Bilirubin 0.3 - 1.2 mg/dL 0.8 0.8 0.7  Alkaline Phos 38 - 126 U/L 39 47 47  AST 15 - 41 U/L _2 ALT 14 - 54 U/L 18 20 18  No results found for: FOY774   Lab Results  Component Value Date   WBC 3.8 (L) 02/28/2017   HGB 12.2 02/28/2017   HCT 35.3 (L) 02/28/2017   MCV 88.5 02/28/2017   PLT 123 (L) 02/28/2017   NEUTROABS 2.1 02/28/2017    ASSESSMENT & PLAN:  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, estrogen receptor positive ([place]) 1.  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  Her joint pains are better than when she was taking letrozole.  She denies any new onset pains.  She underwent hysterectomy in 2016 prophylactically for her BRCA1 positivity. -Family history significant for mother who had breast cancer at age 86, sister who had breast cancer at 39, maternal aunt at age 70, father died of metastatic prostate  cancer, paternal grandmother had metastatic cervical cancer.  2.  Osteopenia: Last bone density test in August 2017 shows osteopenia.  She is taking calcium and vitamin D twice daily.  She was started on Prolia every 6 months and received 2 doses on 06/14/2016 and 12/12/2016.  She missed her dose in December 2018 because of lack of insurance.  She is ready to proceed with her next injection today.  I plan to repeat bone density test in 6 months.  3.  Anxiety: She takes Xanax 0.25 mg as needed for anxiety.  4.  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  She is on Vicodin 10 mg every 4 hours as needed.  She takes 2 tablets 3 times a day.  She also takes Neurontin 600 mg 1-1/2 tablet in the morning, 1 tablet at noon, 1-1/2 tablet at bedtime.  She is also taking Cymbalta 90 mg daily.      Breast Cancer therapy associated bone loss: I have recommended calcium, Vitamin D and weight bearing exercises.  I spent 25 minutes talking to the patient of which more than half was spent in counseling and coordination of care.  Orders Placed This Encounter  Procedures  . DG Bone Density    Standing Status:   Future    Standing Expiration Date:   10/21/2018    Order Specific Question:   Reason for Exam (SYMPTOM  OR DIAGNOSIS REQUIRED)    Answer:   screening on aromatase inhibitor    Order Specific Question:   Is the patient pregnant?    Answer:   No    Order Specific Question:   Preferred imaging location?    Answer:   [facility]  . CBC    Standing Status:   Future    Standing Expiration Date:   10/21/2018  . Comprehensive metabolic panel    Standing Status:   Future    Standing Expiration Date:   10/21/2018  . Cancer antigen 15-3    Standing Status:   Future    Standing Expiration Date:   10/21/2018  . CBC    Standing Status:   Future    Standing Expiration Date:   10/21/2018  . Comprehensive metabolic panel    Standing Status:   Future  Standing Expiration Date:   10/21/2018  . Cancer antigen 15-3    Standing Status:   Future    Standing Expiration Date:   10/21/2018   The patient has a good understanding of the overall plan. she agrees with it. she will call with any problems that may develop before the next visit here.   Derek Jack, MD 10/21/17

## 2017-10-31 ENCOUNTER — Other Ambulatory Visit (HOSPITAL_COMMUNITY): Payer: Self-pay | Admitting: Adult Health

## 2017-10-31 DIAGNOSIS — Z1501 Genetic susceptibility to malignant neoplasm of breast: Secondary | ICD-10-CM

## 2017-10-31 DIAGNOSIS — C50911 Malignant neoplasm of unspecified site of right female breast: Secondary | ICD-10-CM

## 2017-10-31 DIAGNOSIS — Z1509 Genetic susceptibility to other malignant neoplasm: Secondary | ICD-10-CM

## 2017-11-01 ENCOUNTER — Other Ambulatory Visit (HOSPITAL_COMMUNITY): Payer: Self-pay

## 2017-11-01 DIAGNOSIS — Z170 Estrogen receptor positive status [ER+]: Secondary | ICD-10-CM

## 2017-11-01 DIAGNOSIS — Z79811 Long term (current) use of aromatase inhibitors: Secondary | ICD-10-CM

## 2017-11-01 DIAGNOSIS — M898X9 Other specified disorders of bone, unspecified site: Secondary | ICD-10-CM

## 2017-11-01 DIAGNOSIS — C50511 Malignant neoplasm of lower-outer quadrant of right female breast: Secondary | ICD-10-CM

## 2017-11-01 DIAGNOSIS — C50911 Malignant neoplasm of unspecified site of right female breast: Secondary | ICD-10-CM

## 2017-11-01 DIAGNOSIS — M8580 Other specified disorders of bone density and structure, unspecified site: Secondary | ICD-10-CM

## 2017-11-04 ENCOUNTER — Encounter (HOSPITAL_COMMUNITY): Payer: Self-pay

## 2017-11-04 ENCOUNTER — Other Ambulatory Visit: Payer: Self-pay

## 2017-11-04 ENCOUNTER — Inpatient Hospital Stay (HOSPITAL_COMMUNITY): Payer: BLUE CROSS/BLUE SHIELD

## 2017-11-04 ENCOUNTER — Inpatient Hospital Stay (HOSPITAL_COMMUNITY): Payer: BLUE CROSS/BLUE SHIELD | Attending: Hematology

## 2017-11-04 DIAGNOSIS — M8580 Other specified disorders of bone density and structure, unspecified site: Secondary | ICD-10-CM | POA: Diagnosis present

## 2017-11-04 DIAGNOSIS — Z170 Estrogen receptor positive status [ER+]: Secondary | ICD-10-CM

## 2017-11-04 DIAGNOSIS — M898X9 Other specified disorders of bone, unspecified site: Secondary | ICD-10-CM

## 2017-11-04 DIAGNOSIS — Z79811 Long term (current) use of aromatase inhibitors: Secondary | ICD-10-CM

## 2017-11-04 DIAGNOSIS — C50511 Malignant neoplasm of lower-outer quadrant of right female breast: Secondary | ICD-10-CM

## 2017-11-04 DIAGNOSIS — C50911 Malignant neoplasm of unspecified site of right female breast: Secondary | ICD-10-CM

## 2017-11-04 LAB — COMPREHENSIVE METABOLIC PANEL
ALK PHOS: 75 U/L (ref 38–126)
ALT: 19 U/L (ref 14–54)
AST: 18 U/L (ref 15–41)
Albumin: 4.1 g/dL (ref 3.5–5.0)
Anion gap: 7 (ref 5–15)
BILIRUBIN TOTAL: 0.9 mg/dL (ref 0.3–1.2)
BUN: 10 mg/dL (ref 6–20)
CALCIUM: 9.3 mg/dL (ref 8.9–10.3)
CO2: 29 mmol/L (ref 22–32)
CREATININE: 0.64 mg/dL (ref 0.44–1.00)
Chloride: 102 mmol/L (ref 101–111)
GFR calc Af Amer: >60 mL/min (ref >60)
GLUCOSE: 95 mg/dL (ref 65–99)
Potassium: 4 mmol/L (ref 3.5–5.1)
Sodium: 138 mmol/L (ref 135–145)
TOTAL PROTEIN: 7.4 g/dL (ref 6.5–8.1)

## 2017-11-04 MED ORDER — DENOSUMAB 60 MG/ML ~~LOC~~ SOSY
60.0000 mg | PREFILLED_SYRINGE | Freq: Once | SUBCUTANEOUS | Status: AC
  Administered 2017-11-04: 60 mg via SUBCUTANEOUS
  Filled 2017-11-04: qty 1

## 2017-11-04 NOTE — Patient Instructions (Signed)
[place] at [facility] Discharge Instructions  Prolia given today Follow up as scheduled.  Thank you for choosing [place] at [facility] to provide your oncology and hematology care.  To afford each patient quality time with our provider, please arrive at least 15 minutes before your scheduled appointment time.   If you have a lab appointment with the [facility] please come in thru the  Main Entrance and check in at the main information desk  You need to re-schedule your appointment should you arrive 10 or more minutes late.  We strive to give you quality time with our providers, and arriving late affects you and other patients whose appointments are after yours.  Also, if you no show three or more times for appointments you may be dismissed from the clinic at the providers discretion.     Again, thank you for choosing [facility].  Our hope is that these requests will decrease the amount of time that you wait before being seen by our physicians.       _____________________________________________________________  Should you have questions after your visit to [facility], please contact our office at [phone] between the hours of 8:30 a.m. and 4:30 p.m.  Voicemails left after 4:30 p.m. will not be returned until the following business day.  For prescription refill requests, have your pharmacy contact our office.       Resources For Cancer Patients and their Caregivers ? American Cancer Society: Can assist with transportation, wigs, general needs, runs Look Good Feel Better.        681 183 3165 ? Cancer Care: Provides financial assistance, online support groups, medication/co-pay assistance.  1-800-813-HOPE 570-095-0703) ? [place] Assists [place] cancer patients and their families through emotional , educational and financial support.  419-193-9689 ? [place] DSS Where  to apply for food stamps, Medicaid and utility assistance. 629-592-9774 ? [facility]: Transportation to medical appointments. (913) 750-5542 ? Social Security Administration: May apply for disability if have a Stage IV cancer. (754)379-8140 (442) 366-7696 ? [employer], Disability and Transit Services: Assists with nutrition, care and transit needs. [facility] Support Programs:   > Cancer Support Group  2nd Tuesday of the month 1pm-2pm, Journey Room   > Creative Journey  3rd Tuesday of the month 1130am-1pm, Journey Room

## 2017-11-04 NOTE — Progress Notes (Signed)
Carly [name] presents today for injection per MD orders. Prolia 60 mg given. See MAR for details.  Administration without incident. Patient tolerated well.   Vitals stable and discharged home from clinic ambulatory. Follow up as scheduled.

## 2017-11-19 ENCOUNTER — Other Ambulatory Visit (HOSPITAL_COMMUNITY): Payer: Self-pay | Admitting: Adult Health

## 2017-11-19 DIAGNOSIS — C50911 Malignant neoplasm of unspecified site of right female breast: Secondary | ICD-10-CM

## 2017-11-21 NOTE — Telephone Encounter (Signed)
Called pharmacy because she got the Xanax prescription on 09/24/17 with 2 refills. Pharmacy states they are not sure why we got the electronic refill request. Patient just picked up a refill on 11/19/17. Pharmacy states when patient calls for refill they will contact us.

## 2017-12-31 ENCOUNTER — Other Ambulatory Visit (HOSPITAL_COMMUNITY): Payer: Self-pay | Admitting: *Deleted

## 2017-12-31 DIAGNOSIS — C50911 Malignant neoplasm of unspecified site of right female breast: Secondary | ICD-10-CM

## 2018-01-02 MED ORDER — ALPRAZOLAM 0.25 MG PO TABS: 0.2500 mg | ORAL_TABLET | Freq: Three times a day (TID) | ORAL | 0 refills | Status: DC | PRN

## 2018-01-23 DIAGNOSIS — C50919 Malignant neoplasm of unspecified site of unspecified female breast: Secondary | ICD-10-CM | POA: Diagnosis not present

## 2018-01-23 DIAGNOSIS — S52531A Colles' fracture of right radius, initial encounter for closed fracture: Secondary | ICD-10-CM | POA: Diagnosis not present

## 2018-01-23 DIAGNOSIS — M25531 Pain in right wrist: Secondary | ICD-10-CM | POA: Diagnosis not present

## 2018-01-23 DIAGNOSIS — I890 Lymphedema, not elsewhere classified: Secondary | ICD-10-CM | POA: Diagnosis not present

## 2018-01-23 DIAGNOSIS — S52501A Unspecified fracture of the lower end of right radius, initial encounter for closed fracture: Secondary | ICD-10-CM | POA: Insufficient documentation

## 2018-01-24 DIAGNOSIS — Y999 Unspecified external cause status: Secondary | ICD-10-CM | POA: Diagnosis not present

## 2018-01-24 DIAGNOSIS — X58XXXA Exposure to other specified factors, initial encounter: Secondary | ICD-10-CM | POA: Diagnosis not present

## 2018-01-24 DIAGNOSIS — Y939 Activity, unspecified: Secondary | ICD-10-CM | POA: Diagnosis not present

## 2018-01-24 DIAGNOSIS — Y929 Unspecified place or not applicable: Secondary | ICD-10-CM | POA: Diagnosis not present

## 2018-01-24 DIAGNOSIS — S52571A Other intraarticular fracture of lower end of right radius, initial encounter for closed fracture: Secondary | ICD-10-CM | POA: Diagnosis not present

## 2018-01-24 DIAGNOSIS — I890 Lymphedema, not elsewhere classified: Secondary | ICD-10-CM | POA: Insufficient documentation

## 2018-02-05 ENCOUNTER — Other Ambulatory Visit (HOSPITAL_COMMUNITY): Payer: Self-pay | Admitting: Nurse Practitioner

## 2018-02-05 DIAGNOSIS — C50911 Malignant neoplasm of unspecified site of right female breast: Secondary | ICD-10-CM

## 2018-02-06 DIAGNOSIS — Z4789 Encounter for other orthopedic aftercare: Secondary | ICD-10-CM | POA: Insufficient documentation

## 2018-02-06 DIAGNOSIS — S52531D Colles' fracture of right radius, subsequent encounter for closed fracture with routine healing: Secondary | ICD-10-CM | POA: Diagnosis not present

## 2018-02-06 DIAGNOSIS — S52531A Colles' fracture of right radius, initial encounter for closed fracture: Secondary | ICD-10-CM | POA: Diagnosis not present

## 2018-02-20 DIAGNOSIS — M25531 Pain in right wrist: Secondary | ICD-10-CM | POA: Diagnosis not present

## 2018-02-20 DIAGNOSIS — S52501D Unspecified fracture of the lower end of right radius, subsequent encounter for closed fracture with routine healing: Secondary | ICD-10-CM | POA: Diagnosis not present

## 2018-02-20 DIAGNOSIS — Z4789 Encounter for other orthopedic aftercare: Secondary | ICD-10-CM | POA: Diagnosis not present

## 2018-02-20 DIAGNOSIS — M25631 Stiffness of right wrist, not elsewhere classified: Secondary | ICD-10-CM | POA: Diagnosis not present

## 2018-02-26 DIAGNOSIS — Z452 Encounter for adjustment and management of vascular access device: Secondary | ICD-10-CM | POA: Diagnosis not present

## 2018-03-05 DIAGNOSIS — G620 Drug-induced polyneuropathy: Secondary | ICD-10-CM | POA: Diagnosis not present

## 2018-03-06 ENCOUNTER — Other Ambulatory Visit (HOSPITAL_COMMUNITY): Payer: Self-pay | Admitting: Nurse Practitioner

## 2018-03-06 DIAGNOSIS — C50911 Malignant neoplasm of unspecified site of right female breast: Secondary | ICD-10-CM

## 2018-03-20 DIAGNOSIS — Z4789 Encounter for other orthopedic aftercare: Secondary | ICD-10-CM | POA: Diagnosis not present

## 2018-03-20 DIAGNOSIS — S52501D Unspecified fracture of the lower end of right radius, subsequent encounter for closed fracture with routine healing: Secondary | ICD-10-CM | POA: Diagnosis not present

## 2018-03-20 DIAGNOSIS — M25631 Stiffness of right wrist, not elsewhere classified: Secondary | ICD-10-CM | POA: Diagnosis not present

## 2018-03-20 DIAGNOSIS — M25531 Pain in right wrist: Secondary | ICD-10-CM | POA: Diagnosis not present

## 2018-04-11 DIAGNOSIS — Z452 Encounter for adjustment and management of vascular access device: Secondary | ICD-10-CM | POA: Diagnosis not present

## 2018-04-17 ENCOUNTER — Other Ambulatory Visit (HOSPITAL_COMMUNITY): Payer: Self-pay | Admitting: Nurse Practitioner

## 2018-04-17 DIAGNOSIS — C50911 Malignant neoplasm of unspecified site of right female breast: Secondary | ICD-10-CM

## 2018-04-23 ENCOUNTER — Ambulatory Visit (HOSPITAL_COMMUNITY)
Admission: RE | Admit: 2018-04-23 | Discharge: 2018-04-23 | Disposition: A | Discharge status: Home / Self Care | Payer: PPO | Source: Ambulatory Visit | Attending: Hematology | Admitting: Hematology

## 2018-04-23 ENCOUNTER — Inpatient Hospital Stay (HOSPITAL_COMMUNITY): Admission: RE | Admit: 2018-04-23 | Payer: BLUE CROSS/BLUE SHIELD | Source: Ambulatory Visit

## 2018-04-23 DIAGNOSIS — M8580 Other specified disorders of bone density and structure, unspecified site: Secondary | ICD-10-CM | POA: Diagnosis present

## 2018-04-23 DIAGNOSIS — M8589 Other specified disorders of bone density and structure, multiple sites: Secondary | ICD-10-CM | POA: Insufficient documentation

## 2018-04-23 DIAGNOSIS — C50911 Malignant neoplasm of unspecified site of right female breast: Secondary | ICD-10-CM | POA: Insufficient documentation

## 2018-04-23 DIAGNOSIS — Z4789 Encounter for other orthopedic aftercare: Secondary | ICD-10-CM | POA: Diagnosis not present

## 2018-04-23 DIAGNOSIS — S52501D Unspecified fracture of the lower end of right radius, subsequent encounter for closed fracture with routine healing: Secondary | ICD-10-CM | POA: Diagnosis not present

## 2018-05-06 ENCOUNTER — Other Ambulatory Visit (HOSPITAL_COMMUNITY): Payer: BLUE CROSS/BLUE SHIELD

## 2018-05-07 ENCOUNTER — Encounter (HOSPITAL_COMMUNITY): Payer: Self-pay

## 2018-05-07 ENCOUNTER — Inpatient Hospital Stay (HOSPITAL_COMMUNITY): Payer: PPO | Attending: Hematology

## 2018-05-07 DIAGNOSIS — R531 Weakness: Secondary | ICD-10-CM | POA: Insufficient documentation

## 2018-05-07 DIAGNOSIS — F419 Anxiety disorder, unspecified: Secondary | ICD-10-CM | POA: Insufficient documentation

## 2018-05-07 DIAGNOSIS — Z8041 Family history of malignant neoplasm of ovary: Secondary | ICD-10-CM | POA: Insufficient documentation

## 2018-05-07 DIAGNOSIS — Z170 Estrogen receptor positive status [ER+]: Secondary | ICD-10-CM | POA: Diagnosis not present

## 2018-05-07 DIAGNOSIS — R5383 Other fatigue: Secondary | ICD-10-CM | POA: Diagnosis not present

## 2018-05-07 DIAGNOSIS — Z79899 Other long term (current) drug therapy: Secondary | ICD-10-CM | POA: Diagnosis not present

## 2018-05-07 DIAGNOSIS — G629 Polyneuropathy, unspecified: Secondary | ICD-10-CM | POA: Diagnosis not present

## 2018-05-07 DIAGNOSIS — Z79811 Long term (current) use of aromatase inhibitors: Secondary | ICD-10-CM | POA: Insufficient documentation

## 2018-05-07 DIAGNOSIS — Z1501 Genetic susceptibility to malignant neoplasm of breast: Secondary | ICD-10-CM | POA: Diagnosis not present

## 2018-05-07 DIAGNOSIS — C50511 Malignant neoplasm of lower-outer quadrant of right female breast: Secondary | ICD-10-CM | POA: Insufficient documentation

## 2018-05-07 DIAGNOSIS — M8580 Other specified disorders of bone density and structure, unspecified site: Secondary | ICD-10-CM | POA: Diagnosis not present

## 2018-05-07 DIAGNOSIS — Z923 Personal history of irradiation: Secondary | ICD-10-CM | POA: Diagnosis not present

## 2018-05-07 DIAGNOSIS — Z803 Family history of malignant neoplasm of breast: Secondary | ICD-10-CM | POA: Diagnosis not present

## 2018-05-07 DIAGNOSIS — Z9221 Personal history of antineoplastic chemotherapy: Secondary | ICD-10-CM | POA: Diagnosis not present

## 2018-05-07 DIAGNOSIS — Z9013 Acquired absence of bilateral breasts and nipples: Secondary | ICD-10-CM | POA: Insufficient documentation

## 2018-05-07 LAB — COMPREHENSIVE METABOLIC PANEL
ALT: 17 U/L (ref 0–44)
AST: 16 U/L (ref 15–41)
Albumin: 4.1 g/dL (ref 3.5–5.0)
Alkaline Phosphatase: 43 U/L (ref 38–126)
Anion gap: 5 (ref 5–15)
BILIRUBIN TOTAL: 0.7 mg/dL (ref 0.3–1.2)
BUN: 10 mg/dL (ref 6–20)
CHLORIDE: 105 mmol/L (ref 98–111)
CO2: 28 mmol/L (ref 22–32)
CREATININE: 0.73 mg/dL (ref 0.44–1.00)
Calcium: 8.9 mg/dL (ref 8.9–10.3)
GFR calc Af Amer: >60 mL/min (ref >60)
GFR calc non Af Amer: >60 mL/min (ref >60)
Glucose, Bld: 85 mg/dL (ref 70–99)
Potassium: 3.8 mmol/L (ref 3.5–5.1)
Sodium: 138 mmol/L (ref 135–145)
Total Protein: 7.3 g/dL (ref 6.5–8.1)

## 2018-05-07 LAB — CBC
HEMATOCRIT: 35.3 % — AB (ref 36.0–46.0)
Hemoglobin: 11.6 g/dL — ABNORMAL LOW (ref 12.0–15.0)
MCH: 29.8 pg (ref 26.0–34.0)
MCHC: 32.9 g/dL (ref 30.0–36.0)
MCV: 90.7 fL (ref 80.0–100.0)
Platelets: 159 10*3/uL (ref 150–400)
RBC: 3.89 MIL/uL (ref 3.87–5.11)
RDW: 12.9 % (ref 11.5–15.5)
WBC: 4.8 10*3/uL (ref 4.0–10.5)
nRBC: 0 % (ref 0.0–0.2)

## 2018-05-08 ENCOUNTER — Ambulatory Visit (HOSPITAL_COMMUNITY): Payer: BLUE CROSS/BLUE SHIELD | Admitting: Hematology

## 2018-05-08 ENCOUNTER — Ambulatory Visit (HOSPITAL_COMMUNITY): Payer: BLUE CROSS/BLUE SHIELD

## 2018-05-08 ENCOUNTER — Encounter (HOSPITAL_COMMUNITY): Payer: Self-pay

## 2018-05-08 ENCOUNTER — Inpatient Hospital Stay (HOSPITAL_COMMUNITY): Payer: PPO

## 2018-05-08 VITALS — BP 122/79 | HR 91 | Temp 98.1°F | Resp 18

## 2018-05-08 DIAGNOSIS — C50911 Malignant neoplasm of unspecified site of right female breast: Secondary | ICD-10-CM

## 2018-05-08 DIAGNOSIS — C50511 Malignant neoplasm of lower-outer quadrant of right female breast: Secondary | ICD-10-CM | POA: Diagnosis not present

## 2018-05-08 DIAGNOSIS — Z79811 Long term (current) use of aromatase inhibitors: Secondary | ICD-10-CM

## 2018-05-08 DIAGNOSIS — M8580 Other specified disorders of bone density and structure, unspecified site: Secondary | ICD-10-CM

## 2018-05-08 LAB — CANCER ANTIGEN 15-3: CAN 15 3: 13.8 U/mL (ref 0.0–25.0)

## 2018-05-08 MED ORDER — DENOSUMAB 60 MG/ML ~~LOC~~ SOSY
60.0000 mg | PREFILLED_SYRINGE | Freq: Once | SUBCUTANEOUS | Status: AC
  Administered 2018-05-08: 60 mg via SUBCUTANEOUS
  Filled 2018-05-08: qty 1

## 2018-05-08 NOTE — Progress Notes (Signed)
Carly [name] tolerated Prolia injection well without complaints or incident. Calcium 8.9 today. Pt denies any tooth or jaw pain and no recent or future dental visits. Pt continues to take Calcium PO as prescribed without issues. VSS Pt discharged self ambulatory in satisfactory condition

## 2018-05-08 NOTE — Patient Instructions (Signed)
[facility] at [facility] Discharge Instructions  Received Prolia injection today. Follow-up as scheduled. Call clinic for any questions or concerns   Thank you for choosing [facility] at [facility] to provide your oncology and hematology care.  To afford each patient quality time with our provider, please arrive at least 15 minutes before your scheduled appointment time.   If you have a lab appointment with the Cancer Center please come in thru the  Main Entrance and check in at the main information desk  You need to re-schedule your appointment should you arrive 10 or more minutes late.  We strive to give you quality time with our providers, and arriving late affects you and other patients whose appointments are after yours.  Also, if you no show three or more times for appointments you may be dismissed from the clinic at the providers discretion.     Again, thank you for choosing [facility].  Our hope is that these requests will decrease the amount of time that you wait before being seen by our physicians.       _____________________________________________________________  Should you have questions after your visit to [facility], please contact our office at [phone] between the hours of 8:00 a.m. and 4:30 p.m.  Voicemails left after 4:00 p.m. will not be returned until the following business day.  For prescription refill requests, have your pharmacy contact our office and allow 72 hours.    Cancer Center Support Programs:   > Cancer Support Group  2nd Tuesday of the month 1pm-2pm, Journey Room   

## 2018-05-12 ENCOUNTER — Inpatient Hospital Stay (HOSPITAL_BASED_OUTPATIENT_CLINIC_OR_DEPARTMENT_OTHER): Payer: PPO | Admitting: Hematology

## 2018-05-12 ENCOUNTER — Other Ambulatory Visit: Payer: Self-pay

## 2018-05-12 ENCOUNTER — Encounter (HOSPITAL_COMMUNITY): Payer: Self-pay | Admitting: Hematology

## 2018-05-12 VITALS — BP 139/72 | HR 92 | Temp 97.7°F | Resp 16 | Wt 278.5 lb

## 2018-05-12 DIAGNOSIS — Z9013 Acquired absence of bilateral breasts and nipples: Secondary | ICD-10-CM | POA: Diagnosis not present

## 2018-05-12 DIAGNOSIS — G629 Polyneuropathy, unspecified: Secondary | ICD-10-CM | POA: Diagnosis not present

## 2018-05-12 DIAGNOSIS — Z79899 Other long term (current) drug therapy: Secondary | ICD-10-CM

## 2018-05-12 DIAGNOSIS — Z8041 Family history of malignant neoplasm of ovary: Secondary | ICD-10-CM

## 2018-05-12 DIAGNOSIS — R531 Weakness: Secondary | ICD-10-CM | POA: Diagnosis not present

## 2018-05-12 DIAGNOSIS — R5383 Other fatigue: Secondary | ICD-10-CM

## 2018-05-12 DIAGNOSIS — Z1501 Genetic susceptibility to malignant neoplasm of breast: Secondary | ICD-10-CM

## 2018-05-12 DIAGNOSIS — M8580 Other specified disorders of bone density and structure, unspecified site: Secondary | ICD-10-CM | POA: Diagnosis not present

## 2018-05-12 DIAGNOSIS — Z170 Estrogen receptor positive status [ER+]: Secondary | ICD-10-CM

## 2018-05-12 DIAGNOSIS — Z923 Personal history of irradiation: Secondary | ICD-10-CM | POA: Diagnosis not present

## 2018-05-12 DIAGNOSIS — C50511 Malignant neoplasm of lower-outer quadrant of right female breast: Secondary | ICD-10-CM | POA: Diagnosis not present

## 2018-05-12 DIAGNOSIS — F419 Anxiety disorder, unspecified: Secondary | ICD-10-CM | POA: Diagnosis not present

## 2018-05-12 DIAGNOSIS — Z9221 Personal history of antineoplastic chemotherapy: Secondary | ICD-10-CM | POA: Diagnosis not present

## 2018-05-12 DIAGNOSIS — Z803 Family history of malignant neoplasm of breast: Secondary | ICD-10-CM

## 2018-05-12 DIAGNOSIS — Z79811 Long term (current) use of aromatase inhibitors: Secondary | ICD-10-CM

## 2018-05-12 NOTE — Assessment & Plan Note (Signed)
1.  BRCA1 positive right breast IDC, stage IIIb (T4BN1A) diagnosed in 2016, ER/PR positive, HER-2 negative: - History of right breast DCIS in 2013, status post bilateral mastectomy for a known BRCA1 carrier - Adjuvant chemotherapy with dose dense AC followed by Taxol from 08/15/2015 through 11/22/2015, XRT completed on 01/27/2016 -Femara started in August 2017, switched to anastrozole around August 2018 for joint pains which have slightly improved - She underwent hysterectomy in 2016 prophylactically for her BRCA1 positivity. -Family history significant for mother who had breast cancer at age 104, sister who had breast cancer at 40, maternal aunt at age 9, father died of metastatic prostate cancer, paternal grandmother had metastatic cervical cancer. -Today's physical examination was within normal limits.  Blood work was also within normal limits.  She is tolerating anastrozole although she has bone pains.  We will see her back in 4 months for follow-up.  2.  Osteopenia: - We discussed the DEXA scan results from 04/23/2018 which shows T score of -1.6. -She will continue Prolia every 6 months.  3.  Anxiety: She takes Xanax 0.25 mg as needed for anxiety.  4.  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  She is on Vicodin 10 mg every 4 hours as needed.  She takes 2 tablets 3 times a day.  She also takes Neurontin 600 mg 1-1/2 tablet in the morning, 1 tablet at noon, 1-1/2 tablet at bedtime.  She is also taking Cymbalta 90 mg daily.

## 2018-05-12 NOTE — Patient Instructions (Signed)
[facility] at [facility] Discharge Instructions Follow up in 4 months with labs    Thank you for choosing [facility] at [facility] to provide your oncology and hematology care.  To afford each patient quality time with our provider, please arrive at least 15 minutes before your scheduled appointment time.   If you have a lab appointment with the Cancer Center please come in thru the  Main Entrance and check in at the main information desk  You need to re-schedule your appointment should you arrive 10 or more minutes late.  We strive to give you quality time with our providers, and arriving late affects you and other patients whose appointments are after yours.  Also, if you no show three or more times for appointments you may be dismissed from the clinic at the providers discretion.     Again, thank you for choosing [facility].  Our hope is that these requests will decrease the amount of time that you wait before being seen by our physicians.       _____________________________________________________________  Should you have questions after your visit to [facility], please contact our office at [phone] between the hours of 8:00 a.m. and 4:30 p.m.  Voicemails left after 4:00 p.m. will not be returned until the following business day.  For prescription refill requests, have your pharmacy contact our office and allow 72 hours.    Cancer Center Support Programs:   > Cancer Support Group  2nd Tuesday of the month 1pm-2pm, Journey Room    

## 2018-05-12 NOTE — Progress Notes (Signed)
[place], [place] [address]   CLINIC:  Medical Oncology/Hematology  PCP:  [name], MD [place] [phone]   REASON FOR VISIT: Follow-up for recurrent breast cancer, ER+/PR+/HER2-  CURRENT THERAPY: Anastrazole daily  BRIEF ONCOLOGIC HISTORY:    Recurrent breast cancer ([place])   05/06/2015 Initial Biopsy    Incisional biopsy with Dr. Maryclare Labrador of palpable mass, lower outer quadrant of reconstructed R breast close to nipple ER+ 97%, PR+ 70 (weak) HER 2 - by [facility]    07/06/2015 Surgery    Definitive lumpectomy and LN dissection performed revealing a 3.1 cm primary 1/14 positive LN with a macro metastases, satellite nodules adjacent to the primary, LVI-, however she had skin invasion along with satellite nodules, T4b N1a, Stage IIIB, grade IIII,    07/07/2015 Imaging    Per records negative CT C/A/P and bone scan    08/15/2015 - 11/22/2015 Chemotherapy    AC x 4, Taxol X 4 both given dose dense     - 01/27/2016 Radiation Therapy    Dr. Harlow Ohms. Pablo Ledger    02/06/2016 Imaging    Bone density- BMD as determined from [facility] L1-L4 is 1.021 g/cm2 with a T-Score of -1.3. This patient is considered osteopenic according to [facility]) criteria.       INTERVAL HISTORY:  Ms. Wishon 37 y.o. female returns for routine follow-up for recurrent breast cancer. She is taking anastrozole daily and tolerating it well. She denies any hot flashes. Denies any fevers, night sweats, or weight loss. Denies any new onset pains. Denies any nausea, vomiting, or diarrhea. She lives at home and performs all her own ADLs. She works full time. She reports her appetite at 100% and her energy level ay 25%.     REVIEW OF SYSTEMS:  Review of Systems  Constitutional: Positive for fatigue.  Neurological: Positive for extremity weakness and numbness.  Hematological: Bruises/bleeds easily.  All other systems reviewed and  are negative.    PAST MEDICAL/SURGICAL HISTORY:  Past Medical History:  Diagnosis Date  . Breast cancer ([place]) 2013   BRCA1 positive   Past Surgical History:  Procedure Laterality Date  . ABDOMINAL HYSTERECTOMY    . CESAREAN SECTION    . MASTECTOMY       SOCIAL HISTORY:  Social History   Socioeconomic History  . Marital status: Single    Spouse name: Not on file  . Number of children: Not on file  . Years of education: Not on file  . Highest education level: Not on file  Occupational History  . Not on file  Social Needs  . Financial resource strain: Not on file  . Food insecurity:    Worry: Not on file    Inability: Not on file  . Transportation needs:    Medical: Not on file    Non-medical: Not on file  Tobacco Use  . Smoking status: Never Smoker  . Smokeless tobacco: Never Used  Substance and Sexual Activity  . Alcohol use: No  . Drug use: No  . Sexual activity: Not on file  Lifestyle  . Physical activity:    Days per week: Not on file    Minutes per session: Not on file  . Stress: Not on file  Relationships  . Social connections:    Talks on phone: Not on file    Gets together: Not on file    Attends religious service:  Not on file    Active member of club or organization: Not on file    Attends meetings of clubs or organizations: Not on file    Relationship status: Not on file  . Intimate partner violence:    Fear of current or ex partner: Not on file    Emotionally abused: Not on file    Physically abused: Not on file    Forced sexual activity: Not on file  Other Topics Concern  . Not on file  Social History Narrative  . Not on file    FAMILY HISTORY:  Family History  Problem Relation Age of Onset  . Breast cancer Mother 42  . BRCA 1/2 Mother   . Prostate cancer Father        metastatic  . Breast cancer Sister 51       BRCA1 mutation  . Breast cancer Maternal Aunt        dx in her 16s; BRCA1 mutation  . Heart attack Maternal  Grandfather   . Ovarian cancer Paternal Grandmother        dx in her 55s  . Heart attack Paternal Grandfather   . Healthy Sister   . BRCA 1/2 Maternal Aunt   . Healthy Maternal Aunt        negative for a BRCA mutation  . Healthy Maternal Aunt        negative for a BRCA mutation    CURRENT MEDICATIONS:  Outpatient Encounter Medications as of 05/12/2018  Medication Sig Note  . ALPRAZolam (XANAX) 0.25 MG tablet TAKE ONE TABLET BY MOUTH THREE TIMES DAILY AS NEEDED FOR ANXIETY   . amitriptyline (ELAVIL) 25 MG tablet    . anastrozole (ARIMIDEX) 1 MG tablet TAKE ONE TABLET BY MOUTH DAILY   . Calcium-Magnesium-Vitamin D (CALCIUM 500 PO) Take by mouth daily.   . cyclobenzaprine (FLEXERIL) 5 MG tablet Take 5 mg by mouth 3 (three) times daily as needed.  01/26/2016: Received from: [facility]: Take one tablet (5 mg total) by mouth every 4 (four) hours as needed for Muscle spasms.  Marland Kitchen docusate sodium (COLACE) 100 MG capsule Take by mouth. 01/26/2016: Received from: [facility]: Take one capsule (100 mg total) by mouth 2 (two) times daily.  . DULoxetine (CYMBALTA) 30 MG capsule    . DULoxetine (CYMBALTA) 60 MG capsule Take 1 capsule (60 mg total) by mouth daily. 12/12/2016: 75m daily  . gabapentin (NEURONTIN) 600 MG tablet Take 900 mg 2 (two) times daily by mouth. Take 900 mg twice daily, take 600 mg at noon   . HYDROcodone-acetaminophen (NORCO) 10-325 MG tablet Take 1 tablet by mouth every 4 (four) hours as needed. (Patient taking differently: Take 2 tablets by mouth every 6 (six) hours as needed. )   . ibuprofen (ADVIL,MOTRIN) 200 MG tablet Take by mouth. 01/26/2016: Received from: [facility] Take 200 mg by mouth as needed for Pain.  .Marland KitchenLORazepam (ATIVAN) 1 MG tablet Take 1 mg by mouth.   . Misc. Devices MISC Please provide patient with a compression therapy pump fot her Right arm lymphedema.   . Multiple Vitamins-Minerals (MULTIVITAMIN ADULT PO) Take 1 tablet  daily by mouth.   . ondansetron (ZOFRAN) 4 MG tablet TAKE ONE TABLET BY MOUTH EVERY 6 HOURS AS NEEDED FOR NAUSEA 01/26/2016: [facility] oxyCODONE (OXY IR/ROXICODONE) 5 MG immediate release tablet TAKE ONE TABLET BY MOUTH EVERY 4 HOURS.   .Marland Kitchenpantoprazole (PROTONIX) 40 MG  tablet Take 1 tablet (40 mg total) by mouth daily.   . phentermine (ADIPEX-P) 37.5 MG tablet Take 37.5 mg by mouth daily.   . prochlorperazine (COMPAZINE) 10 MG tablet Take by mouth. 01/26/2016: Received from: [facility]: Take 10 mg by mouth every 6 (six) hours as needed.  . promethazine (PHENERGAN) 12.5 MG tablet Take by mouth. 01/26/2016: Received from: [facility]: Take 12.5 mg by mouth every 6 (six) hours as needed for Nausea.  Marland Kitchen triamterene-hydrochlorothiazide (MAXZIDE-25) 37.5-25 MG tablet Take one tab daily as needed for swelling.   . valACYclovir (VALTREX) 1000 MG tablet Take 2 tablets 2 (two) times daily by mouth.    No facility-administered encounter medications on file as of 05/12/2018.     ALLERGIES:  Allergies  Allergen Reactions  . Sumatriptan Other (See Comments)    Other reaction(s): Unknown Numbness of face and arms     PHYSICAL EXAM:  ECOG Performance status: 1  Vitals:   05/12/18 0940  BP: 139/72  Pulse: 92  Resp: 16  Temp: 97.7 F (36.5 C)  SpO2: 100%   Filed Weights   05/12/18 0940  Weight: 278 lb 8 oz (126.3 kg)    Physical Exam  Constitutional: She appears well-developed and well-nourished.  Musculoskeletal: Normal range of motion.  Neurological: She is alert.  Skin: Skin is warm and dry.  Psychiatric: She has a normal mood and affect. Her behavior is normal. Judgment and thought content normal.  Breast: No palpable masses in the implanted breasts, no skin changes or nipple discharge, no adenopathy.   LABORATORY DATA:  I have reviewed the labs as listed.  CBC    Component Value Date/Time   WBC 4.8 05/07/2018 1522   RBC 3.89  05/07/2018 1522   HGB 11.6 (L) 05/07/2018 1522   HCT 35.3 (L) 05/07/2018 1522   PLT 159 05/07/2018 1522   MCV 90.7 05/07/2018 1522   MCH 29.8 05/07/2018 1522   MCHC 32.9 05/07/2018 1522   RDW 12.9 05/07/2018 1522   LYMPHSABS 1.4 02/28/2017 1247   MONOABS 0.3 02/28/2017 1247   EOSABS 0.1 02/28/2017 1247   BASOSABS 0.0 02/28/2017 1247   CMP Latest Ref Rng & Units 05/07/2018 11/04/2017 02/28/2017  Glucose 70 - 99 mg/dL 85 95 104(H)  BUN 6 - 20 mg/dL _0 Creatinine 0.44 - 1.00 mg/dL 0.73 0.64 0.59  Sodium 135 - 145 mmol/L 138 138 140  Potassium 3.5 - 5.1 mmol/L 3.8 4.0 4.0  Chloride 98 - 111 mmol/L 105 102 107  CO2 22 - 32 mmol/L _1 Calcium 8.9 - 10.3 mg/dL 8.9 9.3 9.0  Total Protein 6.5 - 8.1 g/dL 7.3 7.4 7.3  Total Bilirubin 0.3 - 1.2 mg/dL 0.7 0.9 0.8  Alkaline Phos 38 - 126 U/L 43 75 39  AST 15 - 41 U/L _2 ALT 0 - 44 U/L _3 DIAGNOSTIC IMAGING:  I have personally reviewed DEXA scan and discussed with the patient.     ASSESSMENT & PLAN:   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, estrogen receptor positive ([place]) 1.  BRCA1 positive right breast IDC, stage IIIb (T4BN1A) diagnosed in 2016, ER/PR positive, HER-2 negative: - History of right breast DCIS in 2013, status post bilateral mastectomy for a known BRCA1 carrier - Adjuvant chemotherapy with dose dense AC followed by Taxol from 08/15/2015 through 11/22/2015, XRT completed on 01/27/2016 -Femara started in August  2017, switched to anastrozole around August 2018 for joint pains which have slightly improved - She underwent hysterectomy in 2016 prophylactically for her BRCA1 positivity. -Family history significant for mother who had breast cancer at age 13, sister who had breast cancer at 24, maternal aunt at age 35, father died of metastatic prostate cancer, paternal grandmother had metastatic cervical cancer. -Today's physical examination was within normal limits.  Blood work  was also within normal limits.  She is tolerating anastrozole although she has bone pains.  We will see her back in 4 months for follow-up.  2.  Osteopenia: - We discussed the DEXA scan results from 04/23/2018 which shows T score of -1.6. -She will continue Prolia every 6 months.  3.  Anxiety: She takes Xanax 0.25 mg as needed for anxiety.  4.  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  She is on Vicodin 10 mg every 4 hours as needed.  She takes 2 tablets 3 times a day.  She also takes Neurontin 600 mg 1-1/2 tablet in the morning, 1 tablet at noon, 1-1/2 tablet at bedtime.  She is also taking Cymbalta 90 mg daily.      Orders placed this encounter:  Orders Placed This Encounter  Procedures  . CBC with Differential/Platelet  . Comprehensive metabolic panel  . Cancer antigen 15-3      Derek Jack, MD [place] [phone]

## 2018-05-19 ENCOUNTER — Other Ambulatory Visit (HOSPITAL_COMMUNITY): Payer: Self-pay | Admitting: Nurse Practitioner

## 2018-05-19 DIAGNOSIS — C50911 Malignant neoplasm of unspecified site of right female breast: Secondary | ICD-10-CM

## 2018-05-20 ENCOUNTER — Other Ambulatory Visit (HOSPITAL_COMMUNITY): Payer: Self-pay | Admitting: *Deleted

## 2018-05-20 DIAGNOSIS — Z1509 Genetic susceptibility to other malignant neoplasm: Secondary | ICD-10-CM

## 2018-05-20 DIAGNOSIS — Z1501 Genetic susceptibility to malignant neoplasm of breast: Secondary | ICD-10-CM

## 2018-05-20 DIAGNOSIS — C50911 Malignant neoplasm of unspecified site of right female breast: Secondary | ICD-10-CM

## 2018-05-20 MED ORDER — ANASTROZOLE 1 MG PO TABS: 1.0000 mg | ORAL_TABLET | Freq: Every day | ORAL | 6 refills | Status: DC

## 2018-05-30 DIAGNOSIS — Z452 Encounter for adjustment and management of vascular access device: Secondary | ICD-10-CM | POA: Diagnosis not present

## 2018-06-16 ENCOUNTER — Other Ambulatory Visit (HOSPITAL_COMMUNITY): Payer: Self-pay | Admitting: Nurse Practitioner

## 2018-06-16 DIAGNOSIS — G620 Drug-induced polyneuropathy: Secondary | ICD-10-CM | POA: Diagnosis not present

## 2018-06-16 DIAGNOSIS — Z6841 Body Mass Index (BMI) 40.0 and over, adult: Secondary | ICD-10-CM | POA: Diagnosis not present

## 2018-06-16 DIAGNOSIS — R030 Elevated blood-pressure reading, without diagnosis of hypertension: Secondary | ICD-10-CM | POA: Diagnosis not present

## 2018-06-16 DIAGNOSIS — C50911 Malignant neoplasm of unspecified site of right female breast: Secondary | ICD-10-CM

## 2018-07-05 IMAGING — NM NM BONE WHOLE BODY
4 series · 4 of 4 positions shown · non-contrast
Comparison: None

Radiographic correlation: CT abdomen and pelvis 10/24/2016

CLINICAL DATA: Recurrent stage III RIGHT breast cancer BRCA 1,
restaging, generalized pain for 6 months, fell 4 months ago

EXAM:
NUCLEAR MEDICINE WHOLE BODY BONE SCAN
TECHNIQUE: Whole body anterior and posterior images were obtained approximately
3 hours after intravenous injection of radiopharmaceutical.
RADIOPHARMACEUTICALS:  18.8 mCi Kechnetium-00m MDP IV

[Series 1: whole body · 2.66mm/px · 1 of 1 slices shown (1 of 2)]
[im 1/1]
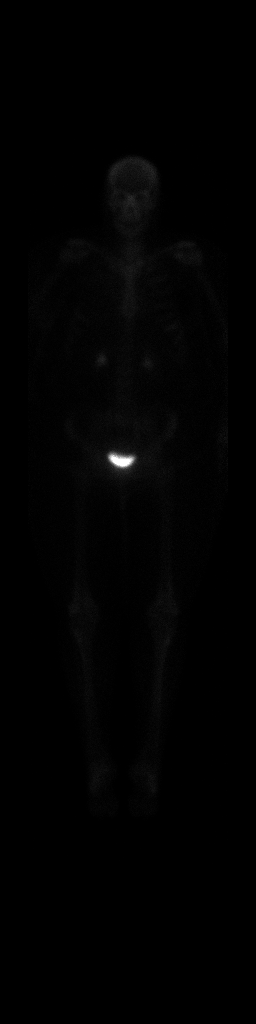

[Series 1: wbr_bone_60 whole body · 2.66mm/px · 1 of 1 slices shown (1 of 2)]
[im 1/1]
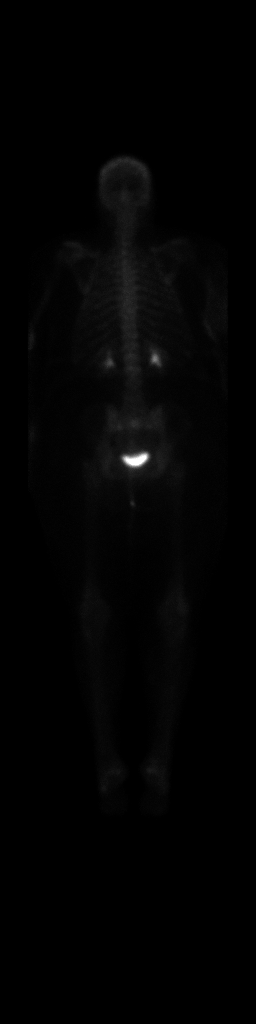

[Series 1: whole body · 2.66mm/px · 1 of 1 slices shown (2 of 2)]
[im 1/1]
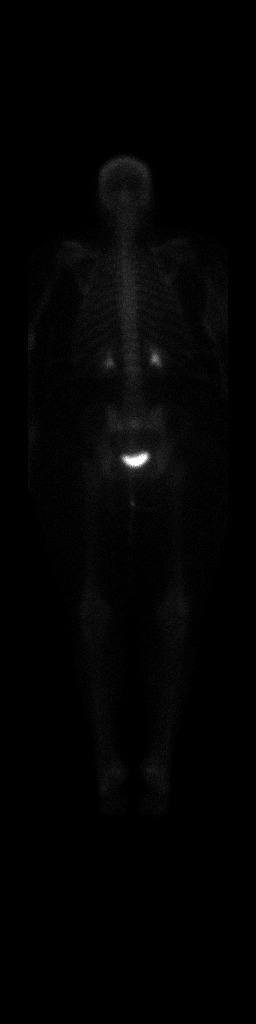

[Series 1: wbr_bone_60 whole body · 2.66mm/px · 1 of 1 slices shown (2 of 2)]
[im 1/1]
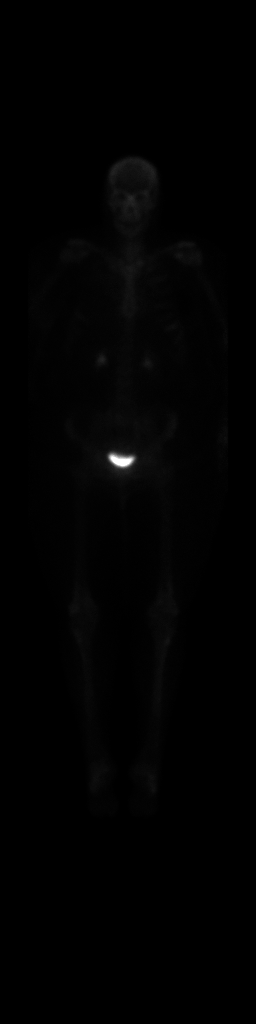

[4 of 4 positions shown; findings below may reference images not displayed]

FINDINGS: Increased uptake at the lateral femoral condyles and extending up
the lateral moped distal femoral metaphyses bilaterally.

Minimal uptake at the feet, LEFT wrist, LEFT elbow, and shoulders,
typically degenerative.

Question abnormal increased uptake at the anterior LEFT sixth rib
versus more likely and artifact from the adjacent breast prosthesis,
with photopenic defects at the anterior chest wall bilaterally
greater on RIGHT from the prostheses.

Question asymmetric increased tracer localization at the anterior
LEFT iliac bone versus RIGHT.

No additional sites of abnormal osseous tracer accumulation are
identified.

Photopenic defect at the RIGHT chest wall

Otherwise expected urinary tract and soft tissue distribution of
tracer.
IMPRESSION: Questionable sites of abnormal increased tracer accumulation at the
anterior LEFT iliac bone and at the lateral aspects of the distal
femora/knees bilaterally.

These sites are nonspecific.

The presence of BILATERAL uptake at the distal femora in a similar
fashion suggests metabolic process or marrow reconversion rather
than metastases, though metastases are not completely excluded and
radiographic correlation should be considered.

## 2018-07-17 ENCOUNTER — Other Ambulatory Visit (HOSPITAL_COMMUNITY): Payer: Self-pay | Admitting: Nurse Practitioner

## 2018-07-17 DIAGNOSIS — C50911 Malignant neoplasm of unspecified site of right female breast: Secondary | ICD-10-CM

## 2018-07-22 DIAGNOSIS — Z6841 Body Mass Index (BMI) 40.0 and over, adult: Secondary | ICD-10-CM | POA: Diagnosis not present

## 2018-07-22 DIAGNOSIS — F331 Major depressive disorder, recurrent, moderate: Secondary | ICD-10-CM | POA: Diagnosis not present

## 2018-07-22 DIAGNOSIS — Z6820 Body mass index (BMI) 20.0-20.9, adult: Secondary | ICD-10-CM | POA: Diagnosis not present

## 2018-07-22 DIAGNOSIS — Z0000 Encounter for general adult medical examination without abnormal findings: Secondary | ICD-10-CM | POA: Diagnosis not present

## 2018-07-22 DIAGNOSIS — F411 Generalized anxiety disorder: Secondary | ICD-10-CM | POA: Diagnosis not present

## 2018-07-22 DIAGNOSIS — E668 Other obesity: Secondary | ICD-10-CM | POA: Diagnosis not present

## 2018-07-22 DIAGNOSIS — G603 Idiopathic progressive neuropathy: Secondary | ICD-10-CM | POA: Diagnosis not present

## 2018-07-22 DIAGNOSIS — J0191 Acute recurrent sinusitis, unspecified: Secondary | ICD-10-CM | POA: Diagnosis not present

## 2018-08-04 ENCOUNTER — Other Ambulatory Visit (HOSPITAL_COMMUNITY): Payer: Self-pay | Admitting: Nurse Practitioner

## 2018-08-04 DIAGNOSIS — C50911 Malignant neoplasm of unspecified site of right female breast: Secondary | ICD-10-CM

## 2018-08-06 DIAGNOSIS — C50511 Malignant neoplasm of lower-outer quadrant of right female breast: Secondary | ICD-10-CM | POA: Diagnosis not present

## 2018-08-06 DIAGNOSIS — Z170 Estrogen receptor positive status [ER+]: Secondary | ICD-10-CM | POA: Diagnosis not present

## 2018-08-06 DIAGNOSIS — Z6841 Body Mass Index (BMI) 40.0 and over, adult: Secondary | ICD-10-CM | POA: Diagnosis not present

## 2018-08-06 DIAGNOSIS — Z9013 Acquired absence of bilateral breasts and nipples: Secondary | ICD-10-CM | POA: Diagnosis not present

## 2018-08-25 DIAGNOSIS — Z452 Encounter for adjustment and management of vascular access device: Secondary | ICD-10-CM | POA: Diagnosis not present

## 2018-08-28 ENCOUNTER — Inpatient Hospital Stay (HOSPITAL_COMMUNITY): Payer: PPO | Attending: Hematology

## 2018-08-28 DIAGNOSIS — C50511 Malignant neoplasm of lower-outer quadrant of right female breast: Secondary | ICD-10-CM | POA: Diagnosis not present

## 2018-08-28 DIAGNOSIS — Z170 Estrogen receptor positive status [ER+]: Secondary | ICD-10-CM | POA: Diagnosis not present

## 2018-08-28 LAB — CBC WITH DIFFERENTIAL/PLATELET
ABS IMMATURE GRANULOCYTES: 0.01 10*3/uL (ref 0.00–0.07)
BASOS PCT: 0 %
Basophils Absolute: 0 10*3/uL (ref 0.0–0.1)
Eosinophils Absolute: 0.1 10*3/uL (ref 0.0–0.5)
Eosinophils Relative: 2 %
HCT: 38 % (ref 36.0–46.0)
Hemoglobin: 12.1 g/dL (ref 12.0–15.0)
Immature Granulocytes: 0 %
LYMPHS ABS: 1.5 10*3/uL (ref 0.7–4.0)
LYMPHS PCT: 33 %
MCH: 28.7 pg (ref 26.0–34.0)
MCHC: 31.8 g/dL (ref 30.0–36.0)
MCV: 90.3 fL (ref 80.0–100.0)
MONOS PCT: 9 %
Monocytes Absolute: 0.4 10*3/uL (ref 0.1–1.0)
NEUTROS ABS: 2.5 10*3/uL (ref 1.7–7.7)
NEUTROS PCT: 56 %
PLATELETS: 166 10*3/uL (ref 150–400)
RBC: 4.21 MIL/uL (ref 3.87–5.11)
RDW: 13.4 % (ref 11.5–15.5)
WBC: 4.5 10*3/uL (ref 4.0–10.5)
nRBC: 0 % (ref 0.0–0.2)

## 2018-08-28 LAB — COMPREHENSIVE METABOLIC PANEL
ALT: 15 U/L (ref 0–44)
AST: 16 U/L (ref 15–41)
Albumin: 4.2 g/dL (ref 3.5–5.0)
Alkaline Phosphatase: 51 U/L (ref 38–126)
Anion gap: 5 (ref 5–15)
BUN: 9 mg/dL (ref 6–20)
CHLORIDE: 105 mmol/L (ref 98–111)
CO2: 28 mmol/L (ref 22–32)
Calcium: 8.7 mg/dL — ABNORMAL LOW (ref 8.9–10.3)
Creatinine, Ser: 0.58 mg/dL (ref 0.44–1.00)
GFR calc Af Amer: >60 mL/min (ref >60)
Glucose, Bld: 96 mg/dL (ref 70–99)
Potassium: 4.7 mmol/L (ref 3.5–5.1)
Sodium: 138 mmol/L (ref 135–145)
Total Bilirubin: 0.6 mg/dL (ref 0.3–1.2)
Total Protein: 7.4 g/dL (ref 6.5–8.1)

## 2018-08-29 LAB — CANCER ANTIGEN 15-3: CAN 15 3: 16.9 U/mL (ref 0.0–25.0)

## 2018-09-01 DIAGNOSIS — R030 Elevated blood-pressure reading, without diagnosis of hypertension: Secondary | ICD-10-CM | POA: Diagnosis not present

## 2018-09-01 DIAGNOSIS — Z6841 Body Mass Index (BMI) 40.0 and over, adult: Secondary | ICD-10-CM | POA: Diagnosis not present

## 2018-09-01 DIAGNOSIS — G620 Drug-induced polyneuropathy: Secondary | ICD-10-CM | POA: Diagnosis not present

## 2018-09-03 ENCOUNTER — Other Ambulatory Visit (HOSPITAL_COMMUNITY): Payer: PPO

## 2018-09-04 ENCOUNTER — Inpatient Hospital Stay (HOSPITAL_COMMUNITY): Payer: PPO | Admitting: Hematology

## 2018-09-04 ENCOUNTER — Other Ambulatory Visit: Payer: Self-pay

## 2018-09-10 ENCOUNTER — Ambulatory Visit (HOSPITAL_COMMUNITY): Payer: PPO | Admitting: Hematology

## 2018-09-17 ENCOUNTER — Other Ambulatory Visit (HOSPITAL_COMMUNITY): Payer: Self-pay | Admitting: Nurse Practitioner

## 2018-09-17 DIAGNOSIS — C50911 Malignant neoplasm of unspecified site of right female breast: Secondary | ICD-10-CM

## 2018-10-03 ENCOUNTER — Other Ambulatory Visit (HOSPITAL_COMMUNITY): Payer: Self-pay | Admitting: Nurse Practitioner

## 2018-10-03 DIAGNOSIS — C50911 Malignant neoplasm of unspecified site of right female breast: Secondary | ICD-10-CM

## 2018-10-06 DIAGNOSIS — Z452 Encounter for adjustment and management of vascular access device: Secondary | ICD-10-CM | POA: Diagnosis not present

## 2018-11-05 ENCOUNTER — Other Ambulatory Visit (HOSPITAL_COMMUNITY): Payer: PPO

## 2018-11-10 ENCOUNTER — Other Ambulatory Visit (HOSPITAL_COMMUNITY): Payer: Self-pay | Admitting: Nurse Practitioner

## 2018-11-10 DIAGNOSIS — C50911 Malignant neoplasm of unspecified site of right female breast: Secondary | ICD-10-CM

## 2018-11-11 ENCOUNTER — Other Ambulatory Visit (HOSPITAL_COMMUNITY): Payer: Self-pay | Admitting: Emergency Medicine

## 2018-11-11 DIAGNOSIS — C50511 Malignant neoplasm of lower-outer quadrant of right female breast: Secondary | ICD-10-CM

## 2018-11-12 ENCOUNTER — Other Ambulatory Visit (HOSPITAL_COMMUNITY): Payer: PPO

## 2018-11-12 ENCOUNTER — Ambulatory Visit (HOSPITAL_COMMUNITY): Payer: PPO

## 2018-11-12 ENCOUNTER — Ambulatory Visit (HOSPITAL_COMMUNITY): Payer: PPO | Admitting: Hematology

## 2018-11-19 DIAGNOSIS — Z452 Encounter for adjustment and management of vascular access device: Secondary | ICD-10-CM | POA: Diagnosis not present

## 2018-11-26 DIAGNOSIS — I10 Essential (primary) hypertension: Secondary | ICD-10-CM | POA: Diagnosis not present

## 2018-11-26 DIAGNOSIS — Z6841 Body Mass Index (BMI) 40.0 and over, adult: Secondary | ICD-10-CM | POA: Diagnosis not present

## 2018-11-26 DIAGNOSIS — J0191 Acute recurrent sinusitis, unspecified: Secondary | ICD-10-CM | POA: Diagnosis not present

## 2018-12-01 DIAGNOSIS — G620 Drug-induced polyneuropathy: Secondary | ICD-10-CM | POA: Diagnosis not present

## 2018-12-06 ENCOUNTER — Other Ambulatory Visit (HOSPITAL_COMMUNITY): Payer: Self-pay | Admitting: Nurse Practitioner

## 2018-12-06 DIAGNOSIS — C50911 Malignant neoplasm of unspecified site of right female breast: Secondary | ICD-10-CM

## 2018-12-10 ENCOUNTER — Inpatient Hospital Stay (HOSPITAL_COMMUNITY): Payer: PPO

## 2018-12-10 ENCOUNTER — Other Ambulatory Visit: Payer: Self-pay

## 2018-12-10 ENCOUNTER — Encounter (HOSPITAL_COMMUNITY): Payer: Self-pay | Admitting: Hematology

## 2018-12-10 ENCOUNTER — Inpatient Hospital Stay (HOSPITAL_COMMUNITY): Payer: PPO | Attending: Hematology

## 2018-12-10 ENCOUNTER — Inpatient Hospital Stay (HOSPITAL_BASED_OUTPATIENT_CLINIC_OR_DEPARTMENT_OTHER): Payer: PPO | Admitting: Hematology

## 2018-12-10 VITALS — BP 111/73 | HR 62 | Temp 98.1°F | Resp 18 | Wt 279.5 lb

## 2018-12-10 DIAGNOSIS — Z9221 Personal history of antineoplastic chemotherapy: Secondary | ICD-10-CM | POA: Diagnosis not present

## 2018-12-10 DIAGNOSIS — F419 Anxiety disorder, unspecified: Secondary | ICD-10-CM | POA: Insufficient documentation

## 2018-12-10 DIAGNOSIS — Z803 Family history of malignant neoplasm of breast: Secondary | ICD-10-CM | POA: Diagnosis not present

## 2018-12-10 DIAGNOSIS — C50911 Malignant neoplasm of unspecified site of right female breast: Secondary | ICD-10-CM

## 2018-12-10 DIAGNOSIS — Z9013 Acquired absence of bilateral breasts and nipples: Secondary | ICD-10-CM | POA: Insufficient documentation

## 2018-12-10 DIAGNOSIS — M8580 Other specified disorders of bone density and structure, unspecified site: Secondary | ICD-10-CM

## 2018-12-10 DIAGNOSIS — C50511 Malignant neoplasm of lower-outer quadrant of right female breast: Secondary | ICD-10-CM

## 2018-12-10 DIAGNOSIS — Z170 Estrogen receptor positive status [ER+]: Secondary | ICD-10-CM

## 2018-12-10 DIAGNOSIS — Z8041 Family history of malignant neoplasm of ovary: Secondary | ICD-10-CM

## 2018-12-10 DIAGNOSIS — Z923 Personal history of irradiation: Secondary | ICD-10-CM | POA: Diagnosis not present

## 2018-12-10 DIAGNOSIS — G4700 Insomnia, unspecified: Secondary | ICD-10-CM | POA: Diagnosis not present

## 2018-12-10 DIAGNOSIS — Z79811 Long term (current) use of aromatase inhibitors: Secondary | ICD-10-CM

## 2018-12-10 DIAGNOSIS — Z79899 Other long term (current) drug therapy: Secondary | ICD-10-CM

## 2018-12-10 DIAGNOSIS — Z90710 Acquired absence of both cervix and uterus: Secondary | ICD-10-CM | POA: Diagnosis not present

## 2018-12-10 DIAGNOSIS — C7951 Secondary malignant neoplasm of bone: Secondary | ICD-10-CM | POA: Diagnosis not present

## 2018-12-10 DIAGNOSIS — Z90722 Acquired absence of ovaries, bilateral: Secondary | ICD-10-CM | POA: Diagnosis not present

## 2018-12-10 LAB — COMPREHENSIVE METABOLIC PANEL
ALT: 15 U/L (ref 0–44)
AST: 18 U/L (ref 15–41)
Albumin: 4 g/dL (ref 3.5–5.0)
Alkaline Phosphatase: 53 U/L (ref 38–126)
Anion gap: 15 (ref 5–15)
BUN: 16 mg/dL (ref 6–20)
CO2: 25 mmol/L (ref 22–32)
Calcium: 9.6 mg/dL (ref 8.9–10.3)
Chloride: 99 mmol/L (ref 98–111)
Creatinine, Ser: 0.69 mg/dL (ref 0.44–1.00)
GFR calc Af Amer: >60 mL/min (ref >60)
GFR calc non Af Amer: >60 mL/min (ref >60)
Glucose, Bld: 111 mg/dL — ABNORMAL HIGH (ref 70–99)
Potassium: 3.8 mmol/L (ref 3.5–5.1)
Sodium: 139 mmol/L (ref 135–145)
Total Bilirubin: 1 mg/dL (ref 0.3–1.2)
Total Protein: 7.4 g/dL (ref 6.5–8.1)

## 2018-12-10 LAB — CBC WITH DIFFERENTIAL/PLATELET
Abs Immature Granulocytes: 0 10*3/uL (ref 0.00–0.07)
Basophils Absolute: 0 10*3/uL (ref 0.0–0.1)
Basophils Relative: 0 %
Eosinophils Absolute: 0.1 10*3/uL (ref 0.0–0.5)
Eosinophils Relative: 2 %
HCT: 38.1 % (ref 36.0–46.0)
Hemoglobin: 12.8 g/dL (ref 12.0–15.0)
Immature Granulocytes: 0 %
Lymphocytes Relative: 42 %
Lymphs Abs: 1.9 10*3/uL (ref 0.7–4.0)
MCH: 29.9 pg (ref 26.0–34.0)
MCHC: 33.6 g/dL (ref 30.0–36.0)
MCV: 89 fL (ref 80.0–100.0)
Monocytes Absolute: 0.3 10*3/uL (ref 0.1–1.0)
Monocytes Relative: 6 %
Neutro Abs: 2.2 10*3/uL (ref 1.7–7.7)
Neutrophils Relative %: 50 %
Platelets: 165 10*3/uL (ref 150–400)
RBC: 4.28 MIL/uL (ref 3.87–5.11)
RDW: 13.2 % (ref 11.5–15.5)
WBC: 4.5 10*3/uL (ref 4.0–10.5)
nRBC: 0 % (ref 0.0–0.2)

## 2018-12-10 MED ORDER — ALPRAZOLAM 0.5 MG PO TABS: 0.5000 mg | ORAL_TABLET | Freq: Every evening | ORAL | 5 refills | Status: DC | PRN

## 2018-12-10 MED ORDER — DENOSUMAB 60 MG/ML ~~LOC~~ SOSY
60.0000 mg | PREFILLED_SYRINGE | Freq: Once | SUBCUTANEOUS | Status: AC
  Administered 2018-12-10: 12:00:00 60 mg via SUBCUTANEOUS

## 2018-12-10 NOTE — Patient Instructions (Addendum)
[facility] at [facility] Discharge Instructions  You were seen today by Dr. Katragadda. He went over your recent lab results. He will see you back after your scan for follow up.   Thank you for choosing [facility] at [facility] to provide your oncology and hematology care.  To afford each patient quality time with our provider, please arrive at least 15 minutes before your scheduled appointment time.   If you have a lab appointment with the Cancer Center please come in thru the  Main Entrance and check in at the main information desk  You need to re-schedule your appointment should you arrive 10 or more minutes late.  We strive to give you quality time with our providers, and arriving late affects you and other patients whose appointments are after yours.  Also, if you no show three or more times for appointments you may be dismissed from the clinic at the providers discretion.     Again, thank you for choosing [facility].  Our hope is that these requests will decrease the amount of time that you wait before being seen by our physicians.       _____________________________________________________________  Should you have questions after your visit to [facility], please contact our office at [phone] between the hours of 8:00 a.m. and 4:30 p.m.  Voicemails left after 4:00 p.m. will not be returned until the following business day.  For prescription refill requests, have your pharmacy contact our office and allow 72 hours.    Cancer Center Support Programs:   > Cancer Support Group  2nd Tuesday of the month 1pm-2pm, Journey Room    

## 2018-12-10 NOTE — Progress Notes (Signed)
[facility], [place] [address]   CLINIC:  Medical Oncology/Hematology  PCP:  [name], MD [place] [phone]   REASON FOR VISIT: Follow-up for recurrent breast cancer, ER+/PR+/HER2-  CURRENT THERAPY: Anastrazole daily  BRIEF ONCOLOGIC HISTORY:  Oncology History  Recurrent breast cancer ([place])  05/06/2015 Initial Biopsy   Incisional biopsy with Dr. Maryclare Labrador of palpable mass, lower outer quadrant of reconstructed R breast close to nipple ER+ 97%, PR+ 70 (weak) HER 2 - by [facility]   07/06/2015 Surgery   Definitive lumpectomy and LN dissection performed revealing a 3.1 cm primary 1/14 positive LN with a macro metastases, satellite nodules adjacent to the primary, LVI-, however she had skin invasion along with satellite nodules, T4b N1a, Stage IIIB, grade IIII,   07/07/2015 Imaging   Per records negative CT C/A/P and bone scan   08/15/2015 - 11/22/2015 Chemotherapy   AC x 4, Taxol X 4 both given dose dense    - 01/27/2016 Radiation Therapy   Dr. Harlow Ohms. Pablo Ledger   02/06/2016 Imaging   Bone density- BMD as determined from [facility] L1-L4 is 1.021 g/cm2 with a T-Score of -1.3. This patient is considered osteopenic according to [facility]) criteria.       INTERVAL HISTORY:  Ms. Cavness 38 y.o. female returns for follow-up of her right breast cancer.  She is reporting that she is taking anastrozole without fail at bedtime.  She is reportedly doubled up on Xanax as it is not helping her sleep.  She also reported right-sided lateral chest wall pain for the last 10 days.  She denies any injury in that area.  She denies any fevers, night sweats or weight loss.  No recent hospitalizations or ER visits.  Appetite is 75%.  Energy levels are low.  Numbness in the feet has been stable.  She goes to pain management for it.  She also felt a knot in her forehead.  I have recommended her to see the dermatology.   REVIEW OF SYSTEMS:  Review of Systems  Constitutional: Negative for fatigue.  Neurological: Positive for numbness.  Hematological: Does not bruise/bleed easily.  Psychiatric/Behavioral: Positive for sleep disturbance.  All other systems reviewed and are negative.    PAST MEDICAL/SURGICAL HISTORY:  Past Medical History:  Diagnosis Date  . Breast cancer ([facility]) 2013   BRCA1 positive   Past Surgical History:  Procedure Laterality Date  . ABDOMINAL HYSTERECTOMY    . CESAREAN SECTION    . MASTECTOMY       SOCIAL HISTORY:  Social History   Socioeconomic History  . Marital status: Single    Spouse name: Not on file  . Number of children: Not on file  . Years of education: Not on file  . Highest education level: Not on file  Occupational History  . Not on file  Social Needs  . Financial resource strain: Not on file  . Food insecurity    Worry: Not on file    Inability: Not on file  . Transportation needs    Medical: Not on file    Non-medical: Not on file  Tobacco Use  . Smoking status: Never Smoker  . Smokeless tobacco: Never Used  Substance and Sexual Activity  . Alcohol use: No  . Drug use: No  . Sexual activity: Not on file  Lifestyle  . Physical activity    Days per week: Not on file  Minutes per session: Not on file  . Stress: Not on file  Relationships  . Social Herbalist on phone: Not on file    Gets together: Not on file    Attends religious service: Not on file    Active member of club or organization: Not on file    Attends meetings of clubs or organizations: Not on file    Relationship status: Not on file  . Intimate partner violence    Fear of current or ex partner: Not on file    Emotionally abused: Not on file    Physically abused: Not on file    Forced sexual activity: Not on file  Other Topics Concern  . Not on file  Social History Narrative  . Not on file    FAMILY HISTORY:  Family History  Problem Relation Age of Onset   . Breast cancer Mother 84  . BRCA 1/2 Mother   . Prostate cancer Father        metastatic  . Breast cancer Sister 2       BRCA1 mutation  . Breast cancer Maternal Aunt        dx in her 81s; BRCA1 mutation  . Heart attack Maternal Grandfather   . Ovarian cancer Paternal Grandmother        dx in her 34s  . Heart attack Paternal Grandfather   . Healthy Sister   . BRCA 1/2 Maternal Aunt   . Healthy Maternal Aunt        negative for a BRCA mutation  . Healthy Maternal Aunt        negative for a BRCA mutation    CURRENT MEDICATIONS:  Outpatient Encounter Medications as of 12/10/2018  Medication Sig Note  . ALPRAZolam (XANAX) 0.5 MG tablet Take 1 tablet (0.5 mg total) by mouth at bedtime as needed. for anxiety   . amitriptyline (ELAVIL) 25 MG tablet    . anastrozole (ARIMIDEX) 1 MG tablet Take 1 tablet (1 mg total) by mouth daily.   . Calcium-Magnesium-Vitamin D (CALCIUM 500 PO) Take by mouth daily.   . cyclobenzaprine (FLEXERIL) 5 MG tablet Take 5 mg by mouth 3 (three) times daily as needed.  01/26/2016: Received from: [place]: Take one tablet (5 mg total) by mouth every 4 (four) hours as needed for Muscle spasms.  Marland Kitchen docusate sodium (COLACE) 100 MG capsule Take by mouth. 01/26/2016: Received from: [place]: Take one capsule (100 mg total) by mouth 2 (two) times daily.  . DULoxetine (CYMBALTA) 30 MG capsule    . DULoxetine (CYMBALTA) 60 MG capsule Take 1 capsule (60 mg total) by mouth daily. 12/12/2016: 16m daily  . gabapentin (NEURONTIN) 600 MG tablet Take 900 mg 2 (two) times daily by mouth. Take 900 mg twice daily, take 600 mg at noon   . HYDROcodone-acetaminophen (NORCO) 10-325 MG tablet Take 1 tablet by mouth every 4 (four) hours as needed. (Patient taking differently: Take 2 tablets by mouth every 6 (six) hours as needed. )   . ibuprofen (ADVIL,MOTRIN) 200 MG tablet Take by mouth. 01/26/2016: Received from: [place] Take 200 mg by  mouth as needed for Pain.  . Misc. Devices MISC Please provide patient with a compression therapy pump fot her Right arm lymphedema.   . Multiple Vitamins-Minerals (MULTIVITAMIN ADULT PO) Take 1 tablet daily by mouth.   . ondansetron (ZOFRAN) 4 MG tablet TAKE ONE TABLET BY MOUTH EVERY 6 HOURS AS  NEEDED FOR NAUSEA 01/26/2016: Received from: [place]  . pantoprazole (PROTONIX) 40 MG tablet Take 1 tablet (40 mg total) by mouth daily.   . prochlorperazine (COMPAZINE) 10 MG tablet Take 10 mg by mouth as needed.  01/26/2016: Received from: [place]: Take 10 mg by mouth every 6 (six) hours as needed.  . promethazine (PHENERGAN) 12.5 MG tablet Take by mouth. 01/26/2016: Received from: [place]: Take 12.5 mg by mouth every 6 (six) hours as needed for Nausea.  Marland Kitchen triamterene-hydrochlorothiazide (MAXZIDE-25) 37.5-25 MG tablet Take one tab daily as needed for swelling.   . [DISCONTINUED] ALPRAZolam (XANAX) 0.25 MG tablet TAKE ONE TABLET BY MOUTH THREE TIMES DAILY AS NEEDED FOR ANXIETY   . LORazepam (ATIVAN) 1 MG tablet Take 1 mg by mouth.   . oxyCODONE (OXY IR/ROXICODONE) 5 MG immediate release tablet TAKE ONE TABLET BY MOUTH EVERY 4 HOURS.   Marland Kitchen phentermine (ADIPEX-P) 37.5 MG tablet Take 37.5 mg by mouth daily.   . valACYclovir (VALTREX) 1000 MG tablet Take 2 tablets by mouth as needed.     No facility-administered encounter medications on file as of 12/10/2018.     ALLERGIES:  Allergies  Allergen Reactions  . Latex Rash  . Other Rash    Oozing blistery rash  . Sumatriptan Other (See Comments)    Other reaction(s): Unknown Numbness of face and arms  . Tape Rash    Oozing blistery rash     PHYSICAL EXAM:  ECOG Performance status: 1  Vitals:   12/10/18 1105  BP: 111/73  Pulse: 62  Resp: 18  Temp: 98.1 F (36.7 C)  SpO2: 99%   Filed Weights   12/10/18 1105  Weight: 279 lb 8 oz (126.8 kg)    Physical Exam Constitutional:      Appearance: Normal  appearance. She is well-developed.  Cardiovascular:     Rate and Rhythm: Normal rate and regular rhythm.     Heart sounds: Normal heart sounds.  Pulmonary:     Effort: Pulmonary effort is normal.     Breath sounds: Normal breath sounds.  Abdominal:     General: There is no distension.     Palpations: Abdomen is soft. There is no mass.  Musculoskeletal: Normal range of motion.  Skin:    General: Skin is warm.  Neurological:     General: No focal deficit present.     Mental Status: She is alert and oriented to person, place, and time.  Psychiatric:        Mood and Affect: Mood normal.        Behavior: Behavior normal.   Breast: No palpable masses in the implanted breast.   No skin changes.  There is tender area in the right lateral chest wall.   LABORATORY DATA:  I have reviewed the labs as listed.  CBC    Component Value Date/Time   WBC 4.5 12/10/2018 1038   RBC 4.28 12/10/2018 1038   HGB 12.8 12/10/2018 1038   HCT 38.1 12/10/2018 1038   PLT 165 12/10/2018 1038   MCV 89.0 12/10/2018 1038   MCH 29.9 12/10/2018 1038   MCHC 33.6 12/10/2018 1038   RDW 13.2 12/10/2018 1038   LYMPHSABS 1.9 12/10/2018 1038   MONOABS 0.3 12/10/2018 1038   EOSABS 0.1 12/10/2018 1038   BASOSABS 0.0 12/10/2018 1038   CMP Latest Ref Rng & Units 12/10/2018 08/28/2018 05/07/2018  Glucose 70 - 99 mg/dL 111(H) 96 85  BUN 6 - 20 mg/dL  _0 Creatinine 0.44 - 1.00 mg/dL 0.69 0.58 0.73  Sodium 135 - 145 mmol/L 139 138 138  Potassium 3.5 - 5.1 mmol/L 3.8 4.7 3.8  Chloride 98 - 111 mmol/L 99 105 105  CO2 22 - 32 mmol/L _1 Calcium 8.9 - 10.3 mg/dL 9.6 8.7(L) 8.9  Total Protein 6.5 - 8.1 g/dL 7.4 7.4 7.3  Total Bilirubin 0.3 - 1.2 mg/dL 1.0 0.6 0.7  Alkaline Phos 38 - 126 U/L 53 51 43  AST 15 - 41 U/L _2 ALT 0 - 44 U/L _3 DIAGNOSTIC IMAGING:  I have personally reviewed DEXA scan and discussed with the patient.     ASSESSMENT & PLAN:   Malignant neoplasm of  lower-outer quadrant of breast of female, estrogen receptor positive ([place]) 1.  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  She had cancer of the right breast in 2017 and had lymph node dissection.  Surgeries were done in [place]. - Adjuvant chemotherapy with dose dense AC followed by Taxol from 08/15/2015 through 11/22/2015, XRT completed on 01/27/2016. -Femara started in August 2017, switched to anastrozole around August 2018 for joint pains which have slightly improved. - TAH and BSO in 2016 prophylactically for her BRCA1 positivity. - I have reviewed her blood work which is within normal limits.  Tumor marker in March was normal.  She missed her appointment at that time. -She complains of right-sided lateral rib pain for the last 1 and half week.  She has a tender area in the right lower lateral ribs. -I have recommended a bone scan given her high risk disease.  She reports that she is not missing doses of anastrozole. -We will see her back after the bone scan.  Tumor marker from today is pending.  2.  Family history: -Mother had breast cancer at age 46, sister who had breast cancer at age 84, maternal aunt breast cancer at age 82, father died of metastatic prostate cancer.  Paternal grandmother had metastatic cervical cancer.  3.  Osteopenia: -DEXA scan on 04/23/2018 shows T score of -1.6. -She was started on Prolia every 6 months in December 2017.  4.  Anxiety/sleep problems: -She is reportedly taking Xanax 0.25 mg 2 tablets at bedtime. -I will change her prescription to Xanax 0.5 mg at bedtime and recent prescription.  Total time spent is 25 minutes with more than 50% of the time spent face-to-face discussing further work-up and coordination of care.    Orders placed this encounter:  Orders Placed This Encounter  Procedures  . NM Bone  Scan Whole Body      Derek Jack, MD [place] [phone]

## 2018-12-10 NOTE — Assessment & Plan Note (Signed)
1.  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  She had cancer of the right breast in 2017 and had lymph node dissection.  Surgeries were done in [place]. - Adjuvant chemotherapy with dose dense AC followed by Taxol from 08/15/2015 through 11/22/2015, XRT completed on 01/27/2016. -Femara started in August 2017, switched to anastrozole around August 2018 for joint pains which have slightly improved. - TAH and BSO in 2016 prophylactically for her BRCA1 positivity. - I have reviewed her blood work which is within normal limits.  Tumor marker in March was normal.  She missed her appointment at that time. -She complains of right-sided lateral rib pain for the last 1 and half week.  She has a tender area in the right lower lateral ribs. -I have recommended a bone scan given her high risk disease.  She reports that she is not missing doses of anastrozole. -We will see her back after the bone scan.  Tumor marker from today is pending.  2.  Family history: -Mother had breast cancer at age 38, sister who had breast cancer at age 70, maternal aunt breast cancer at age 36, father died of metastatic prostate cancer.  Paternal grandmother had metastatic cervical cancer.  3.  Osteopenia: -DEXA scan on 04/23/2018 shows T score of -1.6. -She was started on Prolia every 6 months in December 2017.  4.  Anxiety/sleep problems: -She is reportedly taking Xanax 0.25 mg 2 tablets at bedtime. -I will change her prescription to Xanax 0.5 mg at bedtime and recent prescription.

## 2018-12-10 NOTE — Progress Notes (Signed)
Carly [name] tolerated Prolia injection well without complaints or incident. Calcium 9.6 today and pt denied any tooth or jaw pain but did recently have several fillings but Prolia injection was approved by Dr. Delton Coombes today. Pt continues to take her Calcium PO as prescribed. Pt discharged self ambulatory in satisfactory condition

## 2018-12-10 NOTE — Patient Instructions (Signed)
[facility] at [facility] Discharge Instructions  Received Prolia injection today. Follow-up as scheduled. Call clinic for any questions or concerns   Thank you for choosing [facility] at [facility] to provide your oncology and hematology care.  To afford each patient quality time with our provider, please arrive at least 15 minutes before your scheduled appointment time.   If you have a lab appointment with the Cancer Center please come in thru the  Main Entrance and check in at the main information desk  You need to re-schedule your appointment should you arrive 10 or more minutes late.  We strive to give you quality time with our providers, and arriving late affects you and other patients whose appointments are after yours.  Also, if you no show three or more times for appointments you may be dismissed from the clinic at the providers discretion.     Again, thank you for choosing [facility].  Our hope is that these requests will decrease the amount of time that you wait before being seen by our physicians.       _____________________________________________________________  Should you have questions after your visit to [facility], please contact our office at [phone] between the hours of 8:00 a.m. and 4:30 p.m.  Voicemails left after 4:00 p.m. will not be returned until the following business day.  For prescription refill requests, have your pharmacy contact our office and allow 72 hours.    Cancer Center Support Programs:   > Cancer Support Group  2nd Tuesday of the month 1pm-2pm, Journey Room   

## 2018-12-11 LAB — CANCER ANTIGEN 15-3: CA 15-3: 15.2 U/mL (ref 0.0–25.0)

## 2018-12-16 ENCOUNTER — Encounter (HOSPITAL_COMMUNITY)
Admission: RE | Admit: 2018-12-16 | Discharge: 2018-12-16 | Disposition: A | Discharge status: Home / Self Care | Payer: PPO | Source: Ambulatory Visit | Attending: Hematology | Admitting: Hematology

## 2018-12-16 ENCOUNTER — Other Ambulatory Visit (HOSPITAL_COMMUNITY): Payer: Self-pay | Admitting: Hematology

## 2018-12-16 ENCOUNTER — Other Ambulatory Visit: Payer: Self-pay

## 2018-12-16 DIAGNOSIS — C50911 Malignant neoplasm of unspecified site of right female breast: Secondary | ICD-10-CM

## 2018-12-16 DIAGNOSIS — C50919 Malignant neoplasm of unspecified site of unspecified female breast: Secondary | ICD-10-CM | POA: Diagnosis not present

## 2018-12-16 DIAGNOSIS — C50511 Malignant neoplasm of lower-outer quadrant of right female breast: Secondary | ICD-10-CM

## 2018-12-16 DIAGNOSIS — Z1501 Genetic susceptibility to malignant neoplasm of breast: Secondary | ICD-10-CM

## 2018-12-16 DIAGNOSIS — Z170 Estrogen receptor positive status [ER+]: Secondary | ICD-10-CM | POA: Diagnosis not present

## 2018-12-16 DIAGNOSIS — Z1509 Genetic susceptibility to other malignant neoplasm: Secondary | ICD-10-CM

## 2018-12-16 MED ORDER — TECHNETIUM TC 99M MEDRONATE IV KIT
20.0000 | PACK | Freq: Once | INTRAVENOUS | Status: AC | PRN
  Administered 2018-12-16: 18.97 via INTRAVENOUS

## 2018-12-17 ENCOUNTER — Other Ambulatory Visit: Payer: Self-pay

## 2018-12-18 ENCOUNTER — Inpatient Hospital Stay (HOSPITAL_BASED_OUTPATIENT_CLINIC_OR_DEPARTMENT_OTHER): Payer: PPO | Admitting: Hematology

## 2018-12-18 ENCOUNTER — Ambulatory Visit (HOSPITAL_COMMUNITY): Payer: PPO | Admitting: Hematology

## 2018-12-18 ENCOUNTER — Encounter (HOSPITAL_COMMUNITY): Payer: Self-pay | Admitting: Hematology

## 2018-12-18 VITALS — BP 121/46 | HR 66 | Temp 97.1°F | Resp 16 | Wt 278.0 lb

## 2018-12-18 DIAGNOSIS — Z170 Estrogen receptor positive status [ER+]: Secondary | ICD-10-CM

## 2018-12-18 DIAGNOSIS — Z923 Personal history of irradiation: Secondary | ICD-10-CM

## 2018-12-18 DIAGNOSIS — Z9221 Personal history of antineoplastic chemotherapy: Secondary | ICD-10-CM | POA: Diagnosis not present

## 2018-12-18 DIAGNOSIS — Z90710 Acquired absence of both cervix and uterus: Secondary | ICD-10-CM | POA: Diagnosis not present

## 2018-12-18 DIAGNOSIS — C50511 Malignant neoplasm of lower-outer quadrant of right female breast: Secondary | ICD-10-CM

## 2018-12-18 DIAGNOSIS — Z90722 Acquired absence of ovaries, bilateral: Secondary | ICD-10-CM

## 2018-12-18 DIAGNOSIS — G4700 Insomnia, unspecified: Secondary | ICD-10-CM

## 2018-12-18 DIAGNOSIS — Z79811 Long term (current) use of aromatase inhibitors: Secondary | ICD-10-CM

## 2018-12-18 DIAGNOSIS — Z79899 Other long term (current) drug therapy: Secondary | ICD-10-CM

## 2018-12-18 DIAGNOSIS — Z9013 Acquired absence of bilateral breasts and nipples: Secondary | ICD-10-CM | POA: Diagnosis not present

## 2018-12-18 DIAGNOSIS — Z803 Family history of malignant neoplasm of breast: Secondary | ICD-10-CM

## 2018-12-18 DIAGNOSIS — M8580 Other specified disorders of bone density and structure, unspecified site: Secondary | ICD-10-CM | POA: Diagnosis not present

## 2018-12-18 DIAGNOSIS — F419 Anxiety disorder, unspecified: Secondary | ICD-10-CM | POA: Diagnosis not present

## 2018-12-18 DIAGNOSIS — C7951 Secondary malignant neoplasm of bone: Secondary | ICD-10-CM

## 2018-12-18 DIAGNOSIS — Z8041 Family history of malignant neoplasm of ovary: Secondary | ICD-10-CM

## 2018-12-18 NOTE — Progress Notes (Signed)
Carly [name], [place] [address]   CLINIC:  Medical Oncology/Hematology  PCP:  [name], [place] [phone]   REASON FOR VISIT: Follow-up for recurrent breast cancer, ER+/PR+/HER2-  CURRENT THERAPY: Anastrazole daily  BRIEF ONCOLOGIC HISTORY:  Oncology History  Recurrent breast cancer ([place])  05/06/2015 Initial Biopsy   Incisional biopsy with Dr. Maryclare Labrador of palpable mass, lower outer quadrant of reconstructed R breast close to nipple ER+ 97%, PR+ 70 (weak) HER 2 - by [facility]   07/06/2015 Surgery   Definitive lumpectomy and LN dissection performed revealing a 3.1 cm primary 1/14 positive LN with a macro metastases, satellite nodules adjacent to the primary, LVI-, however she had skin invasion along with satellite nodules, T4b N1a, Stage IIIB, grade IIII,   07/07/2015 Imaging   Per records negative CT C/A/P and bone scan   08/15/2015 - 11/22/2015 Chemotherapy   AC x 4, Taxol X 4 both given dose dense    - 01/27/2016 Radiation Therapy   Dr. Harlow Ohms. Pablo Ledger   02/06/2016 Imaging   Bone density- BMD as determined from [facility] L1-L4 is 1.021 g/cm2 with a T-Score of -1.3. This patient is considered osteopenic according to [facility]) criteria.       INTERVAL HISTORY:  Carly [name] 38 y.o. female returns for follow-up of her right breast cancer.  She has noted some pain in the right lateral chest wall for the last 2 to 3 weeks.  Denies any other problems.  She is tolerating anastrozole very well.  Denies any major hot flashes.  She reports generalized pain.  Denies any recent ER visits or hospitalizations.  Denies any fevers or chills.  Denies any nausea vomiting diarrhea.  No change in bowel habits reported.  REVIEW OF SYSTEMS:  Review of Systems  All other systems reviewed and are negative.    PAST MEDICAL/SURGICAL HISTORY:  Past Medical History:  Diagnosis Date  . Breast cancer ([place] [name])  2013   BRCA1 positive   Past Surgical History:  Procedure Laterality Date  . ABDOMINAL HYSTERECTOMY    . CESAREAN SECTION    . MASTECTOMY       SOCIAL HISTORY:  Social History   Socioeconomic History  . Marital status: Single    Spouse name: Not on file  . Number of children: Not on file  . Years of education: Not on file  . Highest education level: Not on file  Occupational History  . Not on file  Social Needs  . Financial resource strain: Not on file  . Food insecurity    Worry: Not on file    Inability: Not on file  . Transportation needs    Medical: Not on file    Non-medical: Not on file  Tobacco Use  . Smoking status: Never Smoker  . Smokeless tobacco: Never Used  Substance and Sexual Activity  . Alcohol use: No  . Drug use: No  . Sexual activity: Not on file  Lifestyle  . Physical activity    Days per week: Not on file    Minutes per session: Not on file  . Stress: Not on file  Relationships  . Social Herbalist on phone: Not on file    Gets together: Not on file    Attends religious service: Not on file    Active member of club or organization: Not on file    Attends meetings of  clubs or organizations: Not on file    Relationship status: Not on file  . Intimate partner violence    Fear of current or ex partner: Not on file    Emotionally abused: Not on file    Physically abused: Not on file    Forced sexual activity: Not on file  Other Topics Concern  . Not on file  Social History Narrative  . Not on file    FAMILY HISTORY:  Family History  Problem Relation Age of Onset  . Breast cancer Mother 39  . BRCA 1/2 Mother   . Prostate cancer Father        metastatic  . Breast cancer Sister 42       BRCA1 mutation  . Breast cancer Maternal Aunt        dx in her 3s; BRCA1 mutation  . Heart attack Maternal Grandfather   . Ovarian cancer Paternal Grandmother        dx in her 16s  . Heart attack Paternal Grandfather   . Healthy Sister    . BRCA 1/2 Maternal Aunt   . Healthy Maternal Aunt        negative for a BRCA mutation  . Healthy Maternal Aunt        negative for a BRCA mutation    CURRENT MEDICATIONS:  Outpatient Encounter Medications as of 12/18/2018  Medication Sig Note  . ALPRAZolam (XANAX) 0.5 MG tablet Take 1 tablet (0.5 mg total) by mouth at bedtime as needed. for anxiety   . amitriptyline (ELAVIL) 25 MG tablet    . anastrozole (ARIMIDEX) 1 MG tablet TAKE ONE TABLET BY MOUTH DAILY   . Calcium-Magnesium-Vitamin D (CALCIUM 500 PO) Take by mouth daily.   . cyclobenzaprine (FLEXERIL) 5 MG tablet Take 5 mg by mouth 3 (three) times daily as needed.  01/26/2016: Received from: [place]: Take one tablet (5 mg total) by mouth every 4 (four) hours as needed for Muscle spasms.  Marland Kitchen docusate sodium (COLACE) 100 MG capsule Take by mouth. 01/26/2016: Received from: [place]: Take one capsule (100 mg total) by mouth 2 (two) times daily.  . DULoxetine (CYMBALTA) 30 MG capsule    . DULoxetine (CYMBALTA) 60 MG capsule Take 1 capsule (60 mg total) by mouth daily. 12/12/2016: 56m daily  . gabapentin (NEURONTIN) 600 MG tablet Take 900 mg 2 (two) times daily by mouth. Take 900 mg twice daily, take 600 mg at noon   . HYDROcodone-acetaminophen (NORCO) 10-325 MG tablet Take 1 tablet by mouth every 4 (four) hours as needed. (Patient taking differently: Take 2 tablets by mouth every 6 (six) hours as needed. )   . ibuprofen (ADVIL,MOTRIN) 200 MG tablet Take by mouth. 01/26/2016: Received from: [facility] Take 200 mg by mouth as needed for Pain.  .Marland KitchenLORazepam (ATIVAN) 1 MG tablet Take 1 mg by mouth.   . Misc. Devices MISC Please provide patient with a compression therapy pump fot her Right arm lymphedema.   . Multiple Vitamins-Minerals (MULTIVITAMIN ADULT PO) Take 1 tablet daily by mouth.   . ondansetron (ZOFRAN) 4 MG tablet TAKE ONE TABLET BY MOUTH EVERY 6 HOURS AS NEEDED FOR NAUSEA 01/26/2016:  Received from: [facility] . pantoprazole (PROTONIX) 40 MG tablet Take 1 tablet (40 mg total) by mouth daily.   . phentermine (ADIPEX-P) 37.5 MG tablet Take 37.5 mg by mouth daily.   . prochlorperazine (COMPAZINE) 10 MG tablet Take 10 mg by mouth as  needed.  01/26/2016: Received from: [facility]: Take 10 mg by mouth every 6 (six) hours as needed.  . promethazine (PHENERGAN) 12.5 MG tablet Take by mouth. 01/26/2016: Received from: [place]: Take 12.5 mg by mouth every 6 (six) hours as needed for Nausea.  Marland Kitchen triamterene-hydrochlorothiazide (MAXZIDE-25) 37.5-25 MG tablet Take one tab daily as needed for swelling.   . valACYclovir (VALTREX) 1000 MG tablet Take 2 tablets by mouth as needed.    Marland Kitchen oxyCODONE (OXY IR/ROXICODONE) 5 MG immediate release tablet TAKE ONE TABLET BY MOUTH EVERY 4 HOURS.    No facility-administered encounter medications on file as of 12/18/2018.     ALLERGIES:  Allergies  Allergen Reactions  . Latex Rash  . Other Rash    Oozing blistery rash  . Sumatriptan Other (See Comments)    Other reaction(s): Unknown Numbness of face and arms  . Tape Rash    Oozing blistery rash     PHYSICAL EXAM:  ECOG Performance status: 1  Vitals:   12/18/18 1500  BP: (!) 121/46  Pulse: 66  Resp: 16  Temp: (!) 97.1 F (36.2 C)  SpO2: 98%   Filed Weights   12/18/18 1500  Weight: 278 lb (126.1 kg)    Physical Exam Constitutional:      Appearance: Normal appearance. She is well-developed.  Cardiovascular:     Rate and Rhythm: Normal rate and regular rhythm.     Heart sounds: Normal heart sounds.  Pulmonary:     Effort: Pulmonary effort is normal.     Breath sounds: Normal breath sounds.  Abdominal:     General: There is no distension.     Palpations: Abdomen is soft. There is no mass.  Musculoskeletal: Normal range of motion.  Skin:    General: Skin is warm.  Neurological:     General: No focal deficit present.     Mental Status: She is  alert and oriented to person, place, and time.  Psychiatric:        Mood and Affect: Mood normal.        Behavior: Behavior normal.   Breast: No palpable masses in the implanted breast.   No skin changes.  There is tender area in the right lateral chest wall.   LABORATORY DATA:  I have reviewed the labs as listed.  CBC    Component Value Date/Time   WBC 4.5 12/10/2018 1038   RBC 4.28 12/10/2018 1038   HGB 12.8 12/10/2018 1038   HCT 38.1 12/10/2018 1038   PLT 165 12/10/2018 1038   MCV 89.0 12/10/2018 1038   MCH 29.9 12/10/2018 1038   MCHC 33.6 12/10/2018 1038   RDW 13.2 12/10/2018 1038   LYMPHSABS 1.9 12/10/2018 1038   MONOABS 0.3 12/10/2018 1038   EOSABS 0.1 12/10/2018 1038   BASOSABS 0.0 12/10/2018 1038   CMP Latest Ref Rng & Units 12/10/2018 08/28/2018 05/07/2018  Glucose 70 - 99 mg/dL 111(H) 96 85  BUN 6 - 20 mg/dL _0 Creatinine 0.44 - 1.00 mg/dL 0.69 0.58 0.73  Sodium 135 - 145 mmol/L 139 138 138  Potassium 3.5 - 5.1 mmol/L 3.8 4.7 3.8  Chloride 98 - 111 mmol/L 99 105 105  CO2 22 - 32 mmol/L _1 Calcium 8.9 - 10.3 mg/dL 9.6 8.7(L) 8.9  Total Protein 6.5 - 8.1 g/dL 7.4 7.4 7.3  Total Bilirubin 0.3 - 1.2 mg/dL 1.0 0.6 0.7  Alkaline Phos 38 - 126 U/L 53 51  43  AST 15 - 41 U/L _0 ALT 0 - 44 U/L _1 DIAGNOSTIC IMAGING:  I have personally reviewed bone scan and discussed with the patient.     ASSESSMENT & PLAN:   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, estrogen receptor positive ([place]) 1.  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  She had cancer of the right breast in 2017 and had lymph node dissection.  Surgeries were done in [place]. - Adjuvant chemotherapy with dose dense AC followed by Taxol from 08/15/2015 through 11/22/2015, XRT completed on 01/27/2016.  -Femara started in August 2017, switched to anastrozole around August 2018 for joint pains which have slightly improved. - TAH and BSO in 2016 prophylactically for her BRCA1 positivity. - Recent blood work was within normal limits. -She complained of right-sided lateral rib pain for the last couple of weeks.  Hence I have ordered a bone scan. -I reviewed the results of the bone scan dated 12/16/2018 which did not show any evidence of metastatic disease. -She has mild tenderness in the right lateral chest wall but her pain has slightly improved.  Most likely musculoskeletal. -I have also reviewed her tumor marker CA 15-3 which was 15.1. -I will see her back in 6 months for follow-up with repeat blood counts.  2.  Family history: -Mother had breast cancer at age 36, sister who had breast cancer at age 105, maternal aunt breast cancer at age 25, father died of metastatic prostate cancer.  Paternal grandmother had metastatic cervical cancer.  3.  Osteopenia: -DEXA scan on 04/23/2018 shows T score of -1.6. -She was started on Prolia every 6 months in December 2017.  4.  Anxiety/sleep problems: -She is taking Xanax 0.5 mg at bedtime.  Total time spent is 25 minutes with more than 50% of the time spent face-to-face discussing further work-up and coordination of care.    Orders placed this encounter:  Orders Placed This Encounter  Procedures  . CBC with Differential/Platelet  . Comprehensive metabolic panel  . Cancer antigen 15-3      Derek Jack, MD [place] [phone]

## 2018-12-18 NOTE — Patient Instructions (Addendum)
[facility] Discharge Instructions  You were seen today by Dr. Katragadda. He went over your recent test results. He will see you back in 3 months for labs and follow up.   Thank you for choosing [facility] to provide your oncology and hematology care.  To afford each patient quality time with our provider, please arrive at least 15 minutes before your scheduled appointment time.   If you have a lab appointment with the Cancer Center please come in thru the  Main Entrance and check in at the main information desk  You need to re-schedule your appointment should you arrive 10 or more minutes late.  We strive to give you quality time with our providers, and arriving late affects you and other patients whose appointments are after yours.  Also, if you no show three or more times for appointments you may be dismissed from the clinic at the providers discretion.     Again, thank you for choosing [facility].  Our hope is that these requests will decrease the amount of time that you wait before being seen by our physicians.       _____________________________________________________________  Should you have questions after your visit to [facility], please contact our office at [phone] between the hours of 8:00 a.m. and 4:30 p.m.  Voicemails left after 4:00 p.m. will not be returned until the following business day.  For prescription refill requests, have your pharmacy contact our office and allow 72 hours.    Cancer Center Support Programs:   > Cancer Support Group  2nd Tuesday of the month 1pm-2pm, Journey Room    

## 2018-12-18 NOTE — Assessment & Plan Note (Addendum)
1.  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  She had cancer of the right breast in 2017 and had lymph node dissection.  Surgeries were done in [place]. - Adjuvant chemotherapy with dose dense AC followed by Taxol from 08/15/2015 through 11/22/2015, XRT completed on 01/27/2016. -Femara started in August 2017, switched to anastrozole around August 2018 for joint pains which have slightly improved. - TAH and BSO in 2016 prophylactically for her BRCA1 positivity. - Recent blood work was within normal limits. -She complained of right-sided lateral rib pain for the last couple of weeks.  Hence I have ordered a bone scan. -I reviewed the results of the bone scan dated 12/16/2018 which did not show any evidence of metastatic disease. -She has mild tenderness in the right lateral chest wall but her pain has slightly improved.  Most likely musculoskeletal. -I have also reviewed her tumor marker CA 15-3 which was 15.1. -I will see her back in 6 months for follow-up with repeat blood counts.  2.  Family history: -Mother had breast cancer at age 24, sister who had breast cancer at age 103, maternal aunt breast cancer at age 17, father died of metastatic prostate cancer.  Paternal grandmother had metastatic cervical cancer.  3.  Osteopenia: -DEXA scan on 04/23/2018 shows T score of -1.6. -She was started on Prolia every 6 months in December 2017.  4.  Anxiety/sleep problems: -She is taking Xanax 0.5 mg at bedtime.

## 2018-12-25 DIAGNOSIS — C50511 Malignant neoplasm of lower-outer quadrant of right female breast: Secondary | ICD-10-CM | POA: Diagnosis not present

## 2018-12-25 DIAGNOSIS — Z170 Estrogen receptor positive status [ER+]: Secondary | ICD-10-CM | POA: Diagnosis not present

## 2018-12-25 DIAGNOSIS — Z6841 Body Mass Index (BMI) 40.0 and over, adult: Secondary | ICD-10-CM | POA: Diagnosis not present

## 2018-12-25 DIAGNOSIS — Z01419 Encounter for gynecological examination (general) (routine) without abnormal findings: Secondary | ICD-10-CM | POA: Diagnosis not present

## 2018-12-31 DIAGNOSIS — Z452 Encounter for adjustment and management of vascular access device: Secondary | ICD-10-CM | POA: Diagnosis not present

## 2019-01-27 DIAGNOSIS — C50919 Malignant neoplasm of unspecified site of unspecified female breast: Secondary | ICD-10-CM | POA: Diagnosis not present

## 2019-01-27 DIAGNOSIS — W5551XA Bitten by raccoon, initial encounter: Secondary | ICD-10-CM | POA: Diagnosis not present

## 2019-01-27 DIAGNOSIS — Z86718 Personal history of other venous thrombosis and embolism: Secondary | ICD-10-CM | POA: Diagnosis not present

## 2019-01-27 DIAGNOSIS — F419 Anxiety disorder, unspecified: Secondary | ICD-10-CM | POA: Diagnosis not present

## 2019-01-27 DIAGNOSIS — Z79899 Other long term (current) drug therapy: Secondary | ICD-10-CM | POA: Diagnosis not present

## 2019-01-27 DIAGNOSIS — S00411A Abrasion of right ear, initial encounter: Secondary | ICD-10-CM | POA: Diagnosis not present

## 2019-01-30 DIAGNOSIS — Z23 Encounter for immunization: Secondary | ICD-10-CM | POA: Diagnosis not present

## 2019-01-30 DIAGNOSIS — Z203 Contact with and (suspected) exposure to rabies: Secondary | ICD-10-CM | POA: Diagnosis not present

## 2019-02-04 DIAGNOSIS — Z2914 Encounter for prophylactic rabies immune globin: Secondary | ICD-10-CM | POA: Diagnosis not present

## 2019-02-25 DIAGNOSIS — Z6841 Body Mass Index (BMI) 40.0 and over, adult: Secondary | ICD-10-CM | POA: Diagnosis not present

## 2019-02-25 DIAGNOSIS — G620 Drug-induced polyneuropathy: Secondary | ICD-10-CM | POA: Diagnosis not present

## 2019-02-25 DIAGNOSIS — I10 Essential (primary) hypertension: Secondary | ICD-10-CM | POA: Diagnosis not present

## 2019-03-12 ENCOUNTER — Inpatient Hospital Stay (HOSPITAL_COMMUNITY): Payer: PPO | Attending: Hematology

## 2019-03-12 ENCOUNTER — Other Ambulatory Visit: Payer: Self-pay

## 2019-03-12 DIAGNOSIS — Z9221 Personal history of antineoplastic chemotherapy: Secondary | ICD-10-CM | POA: Diagnosis not present

## 2019-03-12 DIAGNOSIS — Z6841 Body Mass Index (BMI) 40.0 and over, adult: Secondary | ICD-10-CM | POA: Diagnosis not present

## 2019-03-12 DIAGNOSIS — Z8041 Family history of malignant neoplasm of ovary: Secondary | ICD-10-CM | POA: Insufficient documentation

## 2019-03-12 DIAGNOSIS — Z803 Family history of malignant neoplasm of breast: Secondary | ICD-10-CM | POA: Insufficient documentation

## 2019-03-12 DIAGNOSIS — M8580 Other specified disorders of bone density and structure, unspecified site: Secondary | ICD-10-CM | POA: Diagnosis not present

## 2019-03-12 DIAGNOSIS — C50911 Malignant neoplasm of unspecified site of right female breast: Secondary | ICD-10-CM | POA: Insufficient documentation

## 2019-03-12 DIAGNOSIS — F419 Anxiety disorder, unspecified: Secondary | ICD-10-CM | POA: Diagnosis not present

## 2019-03-12 DIAGNOSIS — Z791 Long term (current) use of non-steroidal anti-inflammatories (NSAID): Secondary | ICD-10-CM | POA: Insufficient documentation

## 2019-03-12 DIAGNOSIS — M25561 Pain in right knee: Secondary | ICD-10-CM | POA: Diagnosis not present

## 2019-03-12 DIAGNOSIS — F339 Major depressive disorder, recurrent, unspecified: Secondary | ICD-10-CM | POA: Diagnosis not present

## 2019-03-12 DIAGNOSIS — Z79899 Other long term (current) drug therapy: Secondary | ICD-10-CM | POA: Diagnosis not present

## 2019-03-12 DIAGNOSIS — Z79811 Long term (current) use of aromatase inhibitors: Secondary | ICD-10-CM | POA: Insufficient documentation

## 2019-03-12 DIAGNOSIS — Z923 Personal history of irradiation: Secondary | ICD-10-CM | POA: Diagnosis not present

## 2019-03-12 DIAGNOSIS — Z170 Estrogen receptor positive status [ER+]: Secondary | ICD-10-CM | POA: Diagnosis not present

## 2019-03-12 DIAGNOSIS — C50511 Malignant neoplasm of lower-outer quadrant of right female breast: Secondary | ICD-10-CM

## 2019-03-12 DIAGNOSIS — I10 Essential (primary) hypertension: Secondary | ICD-10-CM | POA: Diagnosis not present

## 2019-03-12 DIAGNOSIS — Z0000 Encounter for general adult medical examination without abnormal findings: Secondary | ICD-10-CM | POA: Diagnosis not present

## 2019-03-12 LAB — COMPREHENSIVE METABOLIC PANEL
ALT: 16 U/L (ref 0–44)
AST: 17 U/L (ref 15–41)
Albumin: 4.1 g/dL (ref 3.5–5.0)
Alkaline Phosphatase: 43 U/L (ref 38–126)
Anion gap: 9 (ref 5–15)
BUN: 12 mg/dL (ref 6–20)
CO2: 25 mmol/L (ref 22–32)
Calcium: 9 mg/dL (ref 8.9–10.3)
Chloride: 105 mmol/L (ref 98–111)
Creatinine, Ser: 0.62 mg/dL (ref 0.44–1.00)
GFR calc Af Amer: >60 mL/min (ref >60)
GFR calc non Af Amer: >60 mL/min (ref >60)
Glucose, Bld: 92 mg/dL (ref 70–99)
Potassium: 4.1 mmol/L (ref 3.5–5.1)
Sodium: 139 mmol/L (ref 135–145)
Total Bilirubin: 0.6 mg/dL (ref 0.3–1.2)
Total Protein: 7.4 g/dL (ref 6.5–8.1)

## 2019-03-12 LAB — CBC WITH DIFFERENTIAL/PLATELET
Abs Immature Granulocytes: 0.01 10*3/uL (ref 0.00–0.07)
Basophils Absolute: 0 10*3/uL (ref 0.0–0.1)
Basophils Relative: 0 %
Eosinophils Absolute: 0.1 10*3/uL (ref 0.0–0.5)
Eosinophils Relative: 2 %
HCT: 38.1 % (ref 36.0–46.0)
Hemoglobin: 12.7 g/dL (ref 12.0–15.0)
Immature Granulocytes: 0 %
Lymphocytes Relative: 29 %
Lymphs Abs: 1.4 10*3/uL (ref 0.7–4.0)
MCH: 30.1 pg (ref 26.0–34.0)
MCHC: 33.3 g/dL (ref 30.0–36.0)
MCV: 90.3 fL (ref 80.0–100.0)
Monocytes Absolute: 0.4 10*3/uL (ref 0.1–1.0)
Monocytes Relative: 8 %
Neutro Abs: 2.9 10*3/uL (ref 1.7–7.7)
Neutrophils Relative %: 61 %
Platelets: 166 10*3/uL (ref 150–400)
RBC: 4.22 MIL/uL (ref 3.87–5.11)
RDW: 13.5 % (ref 11.5–15.5)
WBC: 4.8 10*3/uL (ref 4.0–10.5)
nRBC: 0 % (ref 0.0–0.2)

## 2019-03-13 LAB — CANCER ANTIGEN 15-3: CA 15-3: 18.3 U/mL (ref 0.0–25.0)

## 2019-03-19 ENCOUNTER — Inpatient Hospital Stay (HOSPITAL_BASED_OUTPATIENT_CLINIC_OR_DEPARTMENT_OTHER): Payer: PPO | Admitting: Hematology

## 2019-03-19 ENCOUNTER — Other Ambulatory Visit: Payer: Self-pay

## 2019-03-19 ENCOUNTER — Ambulatory Visit: Payer: PPO | Admitting: Orthopaedic Surgery

## 2019-03-19 ENCOUNTER — Ambulatory Visit (HOSPITAL_COMMUNITY): Payer: PPO | Admitting: Hematology

## 2019-03-19 DIAGNOSIS — C50911 Malignant neoplasm of unspecified site of right female breast: Secondary | ICD-10-CM | POA: Diagnosis not present

## 2019-03-19 DIAGNOSIS — C50919 Malignant neoplasm of unspecified site of unspecified female breast: Secondary | ICD-10-CM | POA: Diagnosis not present

## 2019-03-20 NOTE — Progress Notes (Signed)
[address] [place], [place] [address]   CLINIC:  Medical Oncology/Hematology  PCP:  [name], MD [place] [id number] [phone]   REASON FOR VISIT:  Follow-up for Breast Cancer   CURRENT THERAPY: Clinical surveillance  BRIEF ONCOLOGIC HISTORY:  Oncology History  Recurrent breast cancer ([place])  05/06/2015 Initial Biopsy   Incisional biopsy with Dr. Maryclare Labrador of palpable mass, lower outer quadrant of reconstructed R breast close to nipple ER+ 97%, PR+ 70 (weak) HER 2 - by [facility]   07/06/2015 Surgery   Definitive lumpectomy and LN dissection performed revealing a 3.1 cm primary 1/14 positive LN with a macro metastases, satellite nodules adjacent to the primary, LVI-, however she had skin invasion along with satellite nodules, T4b N1a, Stage IIIB, grade IIII,   07/07/2015 Imaging   Per records negative CT C/A/P and bone scan   08/15/2015 - 11/22/2015 Chemotherapy   AC x 4, Taxol X 4 both given dose dense    - 01/27/2016 Radiation Therapy   Dr. Harlow Ohms. Pablo Ledger   02/06/2016 Imaging   Bone density- BMD as determined from [facility] L1-L4 is 1.021 g/cm2 with a T-Score of -1.3. This patient is considered osteopenic according to [facility]) criteria.        INTERVAL HISTORY:  Carly [name] 38 y.o. female presents today for follow-up.  She reports overall doing well.  She denies any significant fatigue.  She denies any changes in her breasts.  No new masses, lumps.  She continues on anastrozole, tolerating well.  She does report tolerable body aches as well as hot flashes.  Denies any change in bowel habits.  Appetite is stable.  No weight loss.  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  She is here for repeat labs and office visit.   REVIEW OF SYSTEMS:  Review of Systems  Constitutional: Negative.   HENT:  Negative.   Eyes: Negative.   Respiratory: Negative.   Cardiovascular:  Negative.   Gastrointestinal: Negative.   Endocrine: Positive for hot flashes.  Genitourinary: Negative.    Musculoskeletal: Positive for myalgias.  Skin: Negative.   Neurological: Negative.   Hematological: Negative.   Psychiatric/Behavioral: Negative.      PAST MEDICAL/SURGICAL HISTORY:  Past Medical History:  Diagnosis Date  . Breast cancer ([place]) 2013   BRCA1 positive   Past Surgical History:  Procedure Laterality Date  . ABDOMINAL HYSTERECTOMY    . CESAREAN SECTION    . MASTECTOMY       SOCIAL HISTORY:  Social History   Socioeconomic History  . Marital status: Single    Spouse name: Not on file  . Number of children: Not on file  . Years of education: Not on file  . Highest education level: Not on file  Occupational History  . Not on file  Social Needs  . Financial resource strain: Not on file  . Food insecurity    Worry: Not on file    Inability: Not on file  . Transportation needs    Medical: Not on file    Non-medical: Not on file  Tobacco Use  . Smoking status: Never Smoker  . Smokeless tobacco: Never Used  Substance and Sexual Activity  . Alcohol use: No  . Drug use: No  . Sexual activity: Not on file  Lifestyle  . Physical activity    Days per week: Not on file    Minutes per session:  Not on file  . Stress: Not on file  Relationships  . Social Herbalist on phone: Not on file    Gets together: Not on file    Attends religious service: Not on file    Active member of club or organization: Not on file    Attends meetings of clubs or organizations: Not on file    Relationship status: Not on file  . Intimate partner violence    Fear of current or ex partner: Not on file    Emotionally abused: Not on file    Physically abused: Not on file    Forced sexual activity: Not on file  Other Topics Concern  . Not on file  Social History Narrative  . Not on file    FAMILY HISTORY:  Family History  Problem Relation Age of Onset  .  Breast cancer Mother 65  . BRCA 1/2 Mother   . Prostate cancer Father        metastatic  . Breast cancer Sister 58       BRCA1 mutation  . Breast cancer Maternal Aunt        dx in her 52s; BRCA1 mutation  . Heart attack Maternal Grandfather   . Ovarian cancer Paternal Grandmother        dx in her 52s  . Heart attack Paternal Grandfather   . Healthy Sister   . BRCA 1/2 Maternal Aunt   . Healthy Maternal Aunt        negative for a BRCA mutation  . Healthy Maternal Aunt        negative for a BRCA mutation    CURRENT MEDICATIONS:  Outpatient Encounter Medications as of 03/19/2019  Medication Sig Note  . amitriptyline (ELAVIL) 25 MG tablet Take 25 mg by mouth. Pt takes 1-3 tablets at bedtime   . anastrozole (ARIMIDEX) 1 MG tablet TAKE ONE TABLET BY MOUTH DAILY   . bisoprolol-hydrochlorothiazide (ZIAC) 5-6.25 MG tablet Take 1 tablet by mouth daily.   . Calcium-Magnesium-Vitamin D (CALCIUM 500 PO) Take by mouth daily.   . DULoxetine (CYMBALTA) 30 MG capsule Take 30 mg by mouth daily.    . DULoxetine (CYMBALTA) 60 MG capsule Take 1 capsule (60 mg total) by mouth daily. 12/12/2016: 6m daily  . gabapentin (NEURONTIN) 600 MG tablet Take 900 mg 2 (two) times daily by mouth. Take 900 mg twice daily, take 600 mg at noon   . Misc. Devices MISC Please provide patient with a compression therapy pump fot her Right arm lymphedema.   . Multiple Vitamins-Minerals (MULTIVITAMIN ADULT PO) Take 1 tablet daily by mouth.   . [DISCONTINUED] HYDROcodone Bitartrate ER 30 MG C12A Take 1 capsule by mouth at bedtime.   . ALPRAZolam (XANAX) 0.5 MG tablet Take 1 tablet (0.5 mg total) by mouth at bedtime as needed. for anxiety (Patient not taking: Reported on 03/19/2019)   . cyclobenzaprine (FLEXERIL) 5 MG tablet Take 5 mg by mouth 3 (three) times daily as needed.  01/26/2016: Received from: [facility] Take one tablet (5 mg total) by mouth every 4 (four) hours as needed for Muscle spasms.  .Marland Kitchendocusate  sodium (COLACE) 100 MG capsule Take by mouth. 01/26/2016: Received from: [facility] Take one capsule (100 mg total) by mouth 2 (two) times daily.  .Marland KitchenHYDROcodone-acetaminophen (NORCO) 10-325 MG tablet Take 1 tablet by mouth every 4 (four) hours as needed. (Patient not taking: Reported on 03/19/2019)   . ondansetron (ZOFRAN)  4 MG tablet TAKE ONE TABLET BY MOUTH EVERY 6 HOURS AS NEEDED FOR NAUSEA 01/26/2016: Received from: [facility]  . oxyCODONE (OXY IR/ROXICODONE) 5 MG immediate release tablet TAKE ONE TABLET BY MOUTH EVERY 4 HOURS.   Marland Kitchen pantoprazole (PROTONIX) 40 MG tablet Take 1 tablet (40 mg total) by mouth daily. (Patient not taking: Reported on 03/19/2019)   . prochlorperazine (COMPAZINE) 10 MG tablet Take 10 mg by mouth as needed.  01/26/2016: Received from: [place]: Take 10 mg by mouth every 6 (six) hours as needed.  . promethazine (PHENERGAN) 12.5 MG tablet Take by mouth. 01/26/2016: Received from: [place]: Take 12.5 mg by mouth every 6 (six) hours as needed for Nausea.  Marland Kitchen triamterene-hydrochlorothiazide (MAXZIDE-25) 37.5-25 MG tablet Take one tab daily as needed for swelling.   . valACYclovir (VALTREX) 1000 MG tablet Take 2 tablets by mouth as needed.    . [DISCONTINUED] ibuprofen (ADVIL,MOTRIN) 200 MG tablet Take by mouth. 01/26/2016: Received from: [place]: Take 200 mg by mouth as needed for Pain.  . [DISCONTINUED] LORazepam (ATIVAN) 1 MG tablet Take 1 mg by mouth.   . [DISCONTINUED] metroNIDAZOLE (METROGEL) 0.75 % vaginal gel INSERT VAGINALLY AS DIRECTED FOR 5 DAYS.   . [DISCONTINUED] phentermine (ADIPEX-P) 37.5 MG tablet Take 37.5 mg by mouth daily.    No facility-administered encounter medications on file as of 03/19/2019.     ALLERGIES:  Allergies  Allergen Reactions  . Latex Rash  . Other Rash    Oozing blistery rash  . Sumatriptan Other (See Comments)    Other reaction(s): Unknown Numbness of face and arms  .  Tape Rash    Oozing blistery rash     PHYSICAL EXAM:  ECOG Performance status: 1  Vitals:   03/19/19 1150  BP: 127/87  Pulse: 69  Resp: 16  Temp: (!) 97.3 F (36.3 C)  SpO2: 99%   Filed Weights   03/19/19 1150  Weight: 284 lb 3.2 oz (128.9 kg)    Physical Exam Constitutional:      Appearance: Normal appearance. She is obese.  HENT:     Head: Normocephalic.     Right Ear: External ear normal.     Left Ear: External ear normal.     Nose: Nose normal.     Mouth/Throat:     Mouth: Mucous membranes are moist.     Pharynx: Oropharynx is clear.  Eyes:     Conjunctiva/sclera: Conjunctivae normal.  Neck:     Musculoskeletal: Normal range of motion.  Cardiovascular:     Rate and Rhythm: Normal rate and regular rhythm.     Pulses: Normal pulses.     Heart sounds: Normal heart sounds.  Pulmonary:     Effort: Pulmonary effort is normal.     Breath sounds: Normal breath sounds.  Abdominal:     General: Bowel sounds are normal.  Musculoskeletal: Normal range of motion.  Skin:    General: Skin is warm.  Neurological:     General: No focal deficit present.     Mental Status: She is alert and oriented to person, place, and time.  Psychiatric:        Mood and Affect: Mood normal.        Behavior: Behavior normal.      LABORATORY DATA:  I have reviewed the labs as listed.  CBC    Component Value Date/Time   WBC 4.8 03/12/2019 1248   RBC 4.22 03/12/2019 1248  HGB 12.7 03/12/2019 1248   HCT 38.1 03/12/2019 1248   PLT 166 03/12/2019 1248   MCV 90.3 03/12/2019 1248   MCH 30.1 03/12/2019 1248   MCHC 33.3 03/12/2019 1248   RDW 13.5 03/12/2019 1248   LYMPHSABS 1.4 03/12/2019 1248   MONOABS 0.4 03/12/2019 1248   EOSABS 0.1 03/12/2019 1248   BASOSABS 0.0 03/12/2019 1248   CMP Latest Ref Rng & Units 03/12/2019 12/10/2018 08/28/2018  Glucose 70 - 99 mg/dL 92 111(H) 96  BUN 6 - 20 mg/dL _0 Creatinine 0.44 - 1.00 mg/dL 0.62 0.69 0.58  Sodium 135 - 145 mmol/L 139  139 138  Potassium 3.5 - 5.1 mmol/L 4.1 3.8 4.7  Chloride 98 - 111 mmol/L 105 99 105  CO2 22 - 32 mmol/L _1 Calcium 8.9 - 10.3 mg/dL 9.0 9.6 8.7(L)  Total Protein 6.5 - 8.1 g/dL 7.4 7.4 7.4  Total Bilirubin 0.3 - 1.2 mg/dL 0.6 1.0 0.6  Alkaline Phos 38 - 126 U/L 43 53 51  AST 15 - 41 U/L _2 ALT 0 - 44 U/L _3 \  ASSESSMENT & PLAN:   Recurrent breast cancer ([facility]) 1.  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  She had cancer of the right breast in 2017 and had lymph node dissection.  Surgeries were done in [place]. - Adjuvant chemotherapy with dose dense AC followed by Taxol from 08/15/2015 through 11/22/2015, XRT completed on 01/27/2016. -Femara started in August 2017, switched to anastrozole around August 2018 for joint pains which have slightly improved. - TAH and BSO in 2016 prophylactically for her BRCA1 positivity. - Recent blood work was within normal limits. -She complained of right-sided lateral rib pain for the last couple of weeks.  Hence I have ordered a bone scan. -Bone scan dated 12/16/2018 which did not show any evidence of metastatic disease. - Labs from March 12, 2019 are essentially normal.  CA-15-3 also within normal at 18.3. -Physical exam did not reveal any masses or abnormalities. - She will return to clinic in 6 months for repeat labs and office visit.  2.  Family history: -Mother had breast cancer at age 82, sister who had breast cancer at age 73, maternal aunt breast cancer at age 57, father died of metastatic prostate cancer.  Paternal grandmother had metastatic cervical cancer.  3.  Osteopenia: -DEXA scan on 04/23/2018 shows T score of -1.6. -She was started on Prolia every 6 months in December 2017.  4.  Anxiety/sleep problems: -She is taking Xanax 0.5 mg at bedtime.       Orders placed this encounter:  Orders Placed This Encounter  Procedures  . CBC with Differential  . Comprehensive metabolic panel  . Cancer antigen 15-3      Roger Shelter, [place] [phone]

## 2019-03-20 NOTE — Assessment & Plan Note (Signed)
1.  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  She had cancer of the right breast in 2017 and had lymph node dissection.  Surgeries were done in [place]. - Adjuvant chemotherapy with dose dense AC followed by Taxol from 08/15/2015 through 11/22/2015, XRT completed on 01/27/2016. -Femara started in August 2017, switched to anastrozole around August 2018 for joint pains which have slightly improved. - TAH and BSO in 2016 prophylactically for her BRCA1 positivity. - Recent blood work was within normal limits. -She complained of right-sided lateral rib pain for the last couple of weeks.  Hence I have ordered a bone scan. -Bone scan dated 12/16/2018 which did not show any evidence of metastatic disease. - Labs from March 12, 2019 are essentially normal.  CA-15-3 also within normal at 18.3. -Physical exam did not reveal any masses or abnormalities. - She will return to clinic in 6 months for repeat labs and office visit.  2.  Family history: -Mother had breast cancer at age 40, sister who had breast cancer at age 66, maternal aunt breast cancer at age 43, father died of metastatic prostate cancer.  Paternal grandmother had metastatic cervical cancer.  3.  Osteopenia: -DEXA scan on 04/23/2018 shows T score of -1.6. -She was started on Prolia every 6 months in December 2017.  4.  Anxiety/sleep problems: -She is taking Xanax 0.5 mg at bedtime.

## 2019-04-08 DIAGNOSIS — Z452 Encounter for adjustment and management of vascular access device: Secondary | ICD-10-CM | POA: Diagnosis not present

## 2019-05-14 ENCOUNTER — Ambulatory Visit: Payer: Self-pay

## 2019-05-14 ENCOUNTER — Encounter: Payer: Self-pay | Admitting: Orthopaedic Surgery

## 2019-05-14 ENCOUNTER — Ambulatory Visit (INDEPENDENT_AMBULATORY_CARE_PROVIDER_SITE_OTHER): Payer: PPO

## 2019-05-14 ENCOUNTER — Ambulatory Visit (INDEPENDENT_AMBULATORY_CARE_PROVIDER_SITE_OTHER): Payer: PPO | Admitting: Orthopaedic Surgery

## 2019-05-14 VITALS — BP 91/62 | HR 62 | Ht 67.0 in | Wt 278.0 lb

## 2019-05-14 DIAGNOSIS — M25562 Pain in left knee: Secondary | ICD-10-CM | POA: Diagnosis not present

## 2019-05-14 DIAGNOSIS — M2241 Chondromalacia patellae, right knee: Secondary | ICD-10-CM | POA: Diagnosis not present

## 2019-05-14 DIAGNOSIS — M25561 Pain in right knee: Secondary | ICD-10-CM | POA: Diagnosis not present

## 2019-05-14 DIAGNOSIS — G8929 Other chronic pain: Secondary | ICD-10-CM

## 2019-05-14 DIAGNOSIS — Z6841 Body Mass Index (BMI) 40.0 and over, adult: Secondary | ICD-10-CM

## 2019-05-14 NOTE — Progress Notes (Signed)
Office Visit Note   Patient: Carly [name]           Date of Birth: Dec 28, 1980           MRN: [id number] Visit Date: 05/14/2019              Requested by: [name], MD [place],  [place] [address] PCP: [name], MD   Assessment & Plan: Visit Diagnoses:  1. Chronic pain of both knees   2. Chondromalacia patellae, right knee   3. Body mass index 40.0-44.9, adult (HCC)     Plan: Patient has bilateral knee chondromalacia patella worse on the right than left knee.  We discussed the recommendations for weight loss which is aggravating her knee symptoms and this is something she is aware of and has been trying to work on it.  We will set her up for some physical therapy for terminal arc extension exercises isometric knee exercises straight leg raising and quad strengthening avoiding flexion of the knee more than 30 degrees where the patella would be in contact with the femur and likely aggravating her symptoms.  We discussed pathophysiology.  If she is not better we can consider diagnostic MRI imaging after therapy.Recheck 7 weeks.  Follow-Up Instructions: Return in about 7 weeks (around 07/02/2019).   Orders:  Orders Placed This Encounter  Procedures  . XR KNEE 3 VIEW RIGHT  . XR KNEE 3 VIEW LEFT   No orders of the defined types were placed in this encounter.     Procedures: No procedures performed   Clinical Data: No additional findings.   Subjective: Chief Complaint  Patient presents with  . Left Knee - Pain  . Right Knee - Pain    HPI 38 year old female returns with ongoing problems with knee pain worse on the right knee than left knee.  She states she has had popping and grinding particularly with walking or kneeling going up steps or getting down on the floor.  She is noted some swelling in her right knee intermittently.  She is taken over-the-counter Aleve.  She has had a history of breast cancer on one side and is on anastrazol and is  wondering if that may be making her have more knee pain.  Pain she rates in the right knee at 4-5.  Patient's been on Neurontin also hydrocodone 10/325.  She takes Cymbalta daily.  Medication for blood pressure.  Breast cancer diagnosis 2013 with local recurrence 2017.  Patient is single does not smoke.  Review of Systems is system positive for breast cancer depression anxiety acid reflux hypertension migraines osteoporosis.  Previous ORIF wrist 2019.  Hysterectomy 2016 C-section 1999.  Otherwise noncontributory.  Past history of chemotherapy.   Objective: Vital Signs: BP 91/62   Pulse 62   Ht '5\' 7"'  (1.702 m)   Wt 278 lb (126.1 kg)   BMI 43.54 kg/m   Physical Exam Constitutional:      Appearance: She is well-developed.  HENT:     Head: Normocephalic.     Right Ear: External ear normal.     Left Ear: External ear normal.  Eyes:     Pupils: Pupils are equal, round, and reactive to light.  Neck:     Thyroid: No thyromegaly.     Trachea: No tracheal deviation.  Cardiovascular:     Rate and Rhythm: Normal rate.  Pulmonary:     Effort: Pulmonary effort is normal.  Abdominal:  Palpations: Abdomen is soft.  Skin:    General: Skin is warm and dry.  Neurological:     Mental Status: She is alert and oriented to person, place, and time.  Psychiatric:        Behavior: Behavior normal.     Ortho Exam patient has crepitus with knee extension worse on the right than the left collateral ligaments are stable.  Negative subluxation of the patella.  Positive patellofemoral load.  ACL PCL exam is normal distal pulses are 2+.  Negative logroll of the hips.  No popliteal masses negative straight leg raising 90 degrees.  Skin over the knee is normal.  Patellar tendon quad tendon is normal.  Specialty Comments:  No specialty comments available.  Imaging: Xr Knee 3 View Left  Result Date: 05/15/2019 Standing AP both knees lateral left knee and sunrise patellar x-ray obtained and reviewed.   This shows mild lateral position of the patella on AP image and mild narrowing and slight spurring on sunrise patellar x-ray consistent with chondromalacia patella. Impression: Left knee negative for acute changes mild patellofemoral degenerative changes.  Xr Knee 3 View Right  Result Date: 05/15/2019 Standing AP both knees lateral right knee sunrise patellar view demonstrates mild patellofemoral degenerative changes slightly lateral position patella.  No medial or lateral joint space narrowing negative for acute changes. Impression: Mild changes consistent with chondromalacia patella with relative sparing of medial and lateral compartment right knee.    PMFS History: Patient Active Problem List   Diagnosis Date Noted  . Chondromalacia patellae, right knee 05/14/2019  . Lymphedema 05/02/2017  . Atrophic vaginitis 05/02/2017  . 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, estrogen receptor positive ([place]) 03/12/2017  . Osteopenia 06/14/2016  . Aromatase inhibitor use 06/14/2016  . H/O oophorectomy 12/12/2015  . Chemotherapy induced neutropenia ([facility]) 10/25/2015  . 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 Pain of right upper extremity 09/13/2015  . Port-A-Cath in place 08/26/2015  . Obesity, Class III, BMI 40-49.9 (morbid obesity) ([place]) 08/10/2015  . Splenomegaly 08/10/2015  . Recurrent breast cancer ([place]) 07/12/2015  . Fibrosis of skin 09/29/2012  . BRCA1 positive 05/06/2012  . Migraines 05/06/2012   Past Medical History:  Diagnosis Date  . Breast cancer ([place]) 2013   BRCA1 positive    Family History  Problem Relation Age of Onset  . Breast cancer Mother 42  . BRCA 1/2 Mother   . Prostate cancer Father        metastatic  . Breast cancer Sister 48       BRCA1 mutation  . Breast cancer Maternal Aunt        dx in her 31s; BRCA1 mutation  . Heart attack Maternal Grandfather   . Ovarian cancer Paternal Grandmother        dx in her 41s  . Heart  attack Paternal Grandfather   . Healthy Sister   . BRCA 1/2 Maternal Aunt   . Healthy Maternal Aunt        negative for a BRCA mutation  . Healthy Maternal Aunt        negative for a BRCA mutation    Past Surgical History:  Procedure Laterality Date  . ABDOMINAL HYSTERECTOMY    . CESAREAN SECTION    . MASTECTOMY     Social History   Occupational History  . Not on file  Tobacco Use  . Smoking status: Never Smoker  . Smokeless tobacco: Never Used  Substance and Sexual Activity  . Alcohol  use: No  . Drug use: No  . Sexual activity: Not on file

## 2019-05-26 DIAGNOSIS — Z452 Encounter for adjustment and management of vascular access device: Secondary | ICD-10-CM | POA: Diagnosis not present

## 2019-05-26 DIAGNOSIS — M2241 Chondromalacia patellae, right knee: Secondary | ICD-10-CM | POA: Diagnosis not present

## 2019-05-26 DIAGNOSIS — M2242 Chondromalacia patellae, left knee: Secondary | ICD-10-CM | POA: Diagnosis not present

## 2019-05-29 DIAGNOSIS — M2242 Chondromalacia patellae, left knee: Secondary | ICD-10-CM | POA: Diagnosis not present

## 2019-05-29 DIAGNOSIS — M2241 Chondromalacia patellae, right knee: Secondary | ICD-10-CM | POA: Diagnosis not present

## 2019-06-02 DIAGNOSIS — M2241 Chondromalacia patellae, right knee: Secondary | ICD-10-CM | POA: Diagnosis not present

## 2019-06-02 DIAGNOSIS — M2242 Chondromalacia patellae, left knee: Secondary | ICD-10-CM | POA: Diagnosis not present

## 2019-06-11 ENCOUNTER — Ambulatory Visit (HOSPITAL_COMMUNITY): Payer: PPO

## 2019-06-11 ENCOUNTER — Inpatient Hospital Stay (HOSPITAL_COMMUNITY): Payer: PPO

## 2019-06-16 ENCOUNTER — Other Ambulatory Visit (HOSPITAL_COMMUNITY): Payer: Self-pay | Admitting: Hematology

## 2019-06-16 DIAGNOSIS — Z6841 Body Mass Index (BMI) 40.0 and over, adult: Secondary | ICD-10-CM | POA: Diagnosis not present

## 2019-06-16 DIAGNOSIS — I10 Essential (primary) hypertension: Secondary | ICD-10-CM | POA: Diagnosis not present

## 2019-06-16 DIAGNOSIS — G620 Drug-induced polyneuropathy: Secondary | ICD-10-CM | POA: Diagnosis not present

## 2019-06-16 DIAGNOSIS — C50911 Malignant neoplasm of unspecified site of right female breast: Secondary | ICD-10-CM

## 2019-06-17 ENCOUNTER — Inpatient Hospital Stay (HOSPITAL_COMMUNITY): Payer: PPO

## 2019-06-17 ENCOUNTER — Ambulatory Visit (HOSPITAL_COMMUNITY): Payer: PPO

## 2019-06-22 DIAGNOSIS — Z6841 Body Mass Index (BMI) 40.0 and over, adult: Secondary | ICD-10-CM | POA: Diagnosis not present

## 2019-06-22 DIAGNOSIS — R432 Parageusia: Secondary | ICD-10-CM | POA: Diagnosis not present

## 2019-06-22 DIAGNOSIS — R430 Anosmia: Secondary | ICD-10-CM | POA: Diagnosis not present

## 2019-06-22 DIAGNOSIS — R0789 Other chest pain: Secondary | ICD-10-CM | POA: Diagnosis not present

## 2019-06-23 ENCOUNTER — Ambulatory Visit (HOSPITAL_COMMUNITY): Payer: PPO

## 2019-06-23 ENCOUNTER — Inpatient Hospital Stay (HOSPITAL_COMMUNITY): Payer: PPO

## 2019-06-25 ENCOUNTER — Ambulatory Visit: Payer: PPO | Admitting: Orthopaedic Surgery

## 2019-07-13 ENCOUNTER — Inpatient Hospital Stay (HOSPITAL_COMMUNITY): Payer: PPO

## 2019-07-13 ENCOUNTER — Other Ambulatory Visit: Payer: Self-pay

## 2019-07-13 ENCOUNTER — Inpatient Hospital Stay (HOSPITAL_COMMUNITY): Payer: PPO | Attending: Hematology

## 2019-07-13 ENCOUNTER — Encounter (HOSPITAL_COMMUNITY): Payer: Self-pay

## 2019-07-13 VITALS — BP 130/82 | HR 72 | Temp 97.1°F | Resp 18

## 2019-07-13 DIAGNOSIS — Z79811 Long term (current) use of aromatase inhibitors: Secondary | ICD-10-CM

## 2019-07-13 DIAGNOSIS — M8580 Other specified disorders of bone density and structure, unspecified site: Secondary | ICD-10-CM | POA: Insufficient documentation

## 2019-07-13 DIAGNOSIS — C50919 Malignant neoplasm of unspecified site of unspecified female breast: Secondary | ICD-10-CM

## 2019-07-13 DIAGNOSIS — Z79899 Other long term (current) drug therapy: Secondary | ICD-10-CM | POA: Insufficient documentation

## 2019-07-13 DIAGNOSIS — C50911 Malignant neoplasm of unspecified site of right female breast: Secondary | ICD-10-CM

## 2019-07-13 LAB — COMPREHENSIVE METABOLIC PANEL
ALT: 24 U/L (ref 0–44)
AST: 24 U/L (ref 15–41)
Albumin: 3.8 g/dL (ref 3.5–5.0)
Alkaline Phosphatase: 66 U/L (ref 38–126)
Anion gap: 8 (ref 5–15)
BUN: 12 mg/dL (ref 6–20)
CO2: 27 mmol/L (ref 22–32)
Calcium: 9.3 mg/dL (ref 8.9–10.3)
Chloride: 104 mmol/L (ref 98–111)
Creatinine, Ser: 0.72 mg/dL (ref 0.44–1.00)
GFR calc Af Amer: >60 mL/min (ref >60)
GFR calc non Af Amer: >60 mL/min (ref >60)
Glucose, Bld: 103 mg/dL — ABNORMAL HIGH (ref 70–99)
Potassium: 4 mmol/L (ref 3.5–5.1)
Sodium: 139 mmol/L (ref 135–145)
Total Bilirubin: 0.9 mg/dL (ref 0.3–1.2)
Total Protein: 7.3 g/dL (ref 6.5–8.1)

## 2019-07-13 MED ORDER — DENOSUMAB 60 MG/ML ~~LOC~~ SOSY
60.0000 mg | PREFILLED_SYRINGE | Freq: Once | SUBCUTANEOUS | Status: AC
  Administered 2019-07-13: 15:00:00 60 mg via SUBCUTANEOUS

## 2019-07-13 MED ORDER — DENOSUMAB 60 MG/ML ~~LOC~~ SOSY
PREFILLED_SYRINGE | SUBCUTANEOUS | Status: AC
  Filled 2019-07-13: qty 1

## 2019-07-13 MED ORDER — SODIUM CHLORIDE 0.9 % IV SOLN: Freq: Once | INTRAVENOUS | Status: DC

## 2019-07-13 NOTE — Patient Instructions (Signed)
[facility] Discharge Instructions  Received Prolia injection today. Follow-up as scheduled. Call clinic for any questions or concerns   Thank you for choosing [facility] at [facility] to provide your oncology and hematology care.  To afford each patient quality time with our provider, please arrive at least 15 minutes before your scheduled appointment time.   If you have a lab appointment with the Cancer Center please come in thru the Main Entrance and check in at the main information desk.  You need to re-schedule your appointment should you arrive 10 or more minutes late.  We strive to give you quality time with our providers, and arriving late affects you and other patients whose appointments are after yours.  Also, if you no show three or more times for appointments you may be dismissed from the clinic at the providers discretion.     Again, thank you for choosing [facility].  Our hope is that these requests will decrease the amount of time that you wait before being seen by our physicians.       _____________________________________________________________  Should you have questions after your visit to  Cancer Center, please contact our office at [phone] between the hours of 8:00 a.m. and 4:30 p.m.  Voicemails left after 4:00 p.m. will not be returned until the following business day.  For prescription refill requests, have your pharmacy contact our office and allow 72 hours.    Due to Covid, you will need to wear a mask upon entering the hospital. If you do not have a mask, a mask will be given to you at the Main Entrance upon arrival. For doctor visits, patients may have 1 support person with them. For treatment visits, patients can not have anyone with them due to social distancing guidelines and our immunocompromised population.     

## 2019-07-13 NOTE — Progress Notes (Signed)
Carly [name] tolerated Prolia injection well without complaints or incident. Calcium 9.3 today and pt denied any tooth or jaw pain and no recent or future dental visits prior to administering this medication.Pt continues to take her Calcium PO as prescribed without issues VSS Pt discharged self ambulatory in satisfactory condition

## 2019-07-15 ENCOUNTER — Other Ambulatory Visit (HOSPITAL_COMMUNITY): Payer: Self-pay | Admitting: Hematology

## 2019-07-15 DIAGNOSIS — C50911 Malignant neoplasm of unspecified site of right female breast: Secondary | ICD-10-CM

## 2019-07-15 DIAGNOSIS — Z1501 Genetic susceptibility to malignant neoplasm of breast: Secondary | ICD-10-CM

## 2019-09-10 ENCOUNTER — Inpatient Hospital Stay (HOSPITAL_COMMUNITY): Payer: PPO | Attending: Hematology

## 2019-09-17 ENCOUNTER — Ambulatory Visit (HOSPITAL_COMMUNITY): Payer: PPO | Admitting: Nurse Practitioner

## 2019-09-21 ENCOUNTER — Other Ambulatory Visit (HOSPITAL_COMMUNITY): Payer: Self-pay

## 2019-09-21 ENCOUNTER — Other Ambulatory Visit (HOSPITAL_COMMUNITY): Payer: Self-pay | Admitting: *Deleted

## 2019-09-21 DIAGNOSIS — M8580 Other specified disorders of bone density and structure, unspecified site: Secondary | ICD-10-CM

## 2019-09-21 DIAGNOSIS — T451X5A Adverse effect of antineoplastic and immunosuppressive drugs, initial encounter: Secondary | ICD-10-CM | POA: Diagnosis not present

## 2019-09-21 DIAGNOSIS — C50911 Malignant neoplasm of unspecified site of right female breast: Secondary | ICD-10-CM

## 2019-09-21 DIAGNOSIS — Z79811 Long term (current) use of aromatase inhibitors: Secondary | ICD-10-CM

## 2019-09-21 DIAGNOSIS — G620 Drug-induced polyneuropathy: Secondary | ICD-10-CM | POA: Diagnosis not present

## 2019-09-21 DIAGNOSIS — M898X9 Other specified disorders of bone, unspecified site: Secondary | ICD-10-CM

## 2019-09-22 ENCOUNTER — Inpatient Hospital Stay (HOSPITAL_COMMUNITY): Payer: PPO | Attending: Hematology

## 2019-09-22 ENCOUNTER — Other Ambulatory Visit: Payer: Self-pay

## 2019-09-22 DIAGNOSIS — C50911 Malignant neoplasm of unspecified site of right female breast: Secondary | ICD-10-CM

## 2019-09-22 DIAGNOSIS — Z79811 Long term (current) use of aromatase inhibitors: Secondary | ICD-10-CM

## 2019-09-22 DIAGNOSIS — Z853 Personal history of malignant neoplasm of breast: Secondary | ICD-10-CM | POA: Diagnosis not present

## 2019-09-22 DIAGNOSIS — Z79899 Other long term (current) drug therapy: Secondary | ICD-10-CM | POA: Diagnosis not present

## 2019-09-22 DIAGNOSIS — M8580 Other specified disorders of bone density and structure, unspecified site: Secondary | ICD-10-CM | POA: Diagnosis not present

## 2019-09-22 DIAGNOSIS — M898X9 Other specified disorders of bone, unspecified site: Secondary | ICD-10-CM

## 2019-09-22 LAB — CBC WITH DIFFERENTIAL/PLATELET
Abs Immature Granulocytes: 0 10*3/uL (ref 0.00–0.07)
Basophils Absolute: 0 10*3/uL (ref 0.0–0.1)
Basophils Relative: 0 %
Eosinophils Absolute: 0.1 10*3/uL (ref 0.0–0.5)
Eosinophils Relative: 2 %
HCT: 37 % (ref 36.0–46.0)
Hemoglobin: 12.1 g/dL (ref 12.0–15.0)
Immature Granulocytes: 0 %
Lymphocytes Relative: 31 %
Lymphs Abs: 1.2 10*3/uL (ref 0.7–4.0)
MCH: 29.5 pg (ref 26.0–34.0)
MCHC: 32.7 g/dL (ref 30.0–36.0)
MCV: 90.2 fL (ref 80.0–100.0)
Monocytes Absolute: 0.3 10*3/uL (ref 0.1–1.0)
Monocytes Relative: 8 %
Neutro Abs: 2.4 10*3/uL (ref 1.7–7.7)
Neutrophils Relative %: 59 %
Platelets: 168 10*3/uL (ref 150–400)
RBC: 4.1 MIL/uL (ref 3.87–5.11)
RDW: 12.9 % (ref 11.5–15.5)
WBC: 4 10*3/uL (ref 4.0–10.5)
nRBC: 0 % (ref 0.0–0.2)

## 2019-09-22 LAB — COMPREHENSIVE METABOLIC PANEL
ALT: 20 U/L (ref 0–44)
AST: 17 U/L (ref 15–41)
Albumin: 4.1 g/dL (ref 3.5–5.0)
Alkaline Phosphatase: 39 U/L (ref 38–126)
Anion gap: 7 (ref 5–15)
BUN: 10 mg/dL (ref 6–20)
CO2: 25 mmol/L (ref 22–32)
Calcium: 9 mg/dL (ref 8.9–10.3)
Chloride: 108 mmol/L (ref 98–111)
Creatinine, Ser: 0.55 mg/dL (ref 0.44–1.00)
GFR calc Af Amer: >60 mL/min (ref >60)
GFR calc non Af Amer: >60 mL/min (ref >60)
Glucose, Bld: 114 mg/dL — ABNORMAL HIGH (ref 70–99)
Potassium: 4.1 mmol/L (ref 3.5–5.1)
Sodium: 140 mmol/L (ref 135–145)
Total Bilirubin: 0.8 mg/dL (ref 0.3–1.2)
Total Protein: 7.2 g/dL (ref 6.5–8.1)

## 2019-09-22 LAB — TSH: TSH: 1.506 u[IU]/mL (ref 0.350–4.500)

## 2019-09-22 LAB — VITAMIN D 25 HYDROXY (VIT D DEFICIENCY, FRACTURES): Vit D, 25-Hydroxy: 30.29 ng/mL (ref 30–100)

## 2019-09-23 LAB — CANCER ANTIGEN 15-3: CA 15-3: 16.6 U/mL (ref 0.0–25.0)

## 2019-09-30 ENCOUNTER — Inpatient Hospital Stay (HOSPITAL_COMMUNITY): Payer: PPO | Attending: Hematology | Admitting: Nurse Practitioner

## 2019-09-30 DIAGNOSIS — C50919 Malignant neoplasm of unspecified site of unspecified female breast: Secondary | ICD-10-CM

## 2019-09-30 NOTE — Progress Notes (Signed)
Carly [name], Carly [name] [id number]   CLINIC:  Medical Oncology/Hematology  PCP:  Carly Burly, MD [place] [phone]   REASON FOR VISIT: Follow-up for breast cancer  CURRENT THERAPY: Anastrozole  BRIEF ONCOLOGIC HISTORY:  Oncology History  Recurrent breast cancer (Carly [name])  05/06/2015 Initial Biopsy   Incisional biopsy with Dr. Maryclare [name] of palpable mass, lower outer quadrant of reconstructed R breast close to nipple ER+ 97%, PR+ 70 (weak) HER 2 - by Carly [name]   07/06/2015 Surgery   Definitive lumpectomy and LN dissection performed revealing a 3.1 cm primary 1/14 positive LN with a macro metastases, satellite nodules adjacent to the primary, LVI-, however she had skin invasion along with satellite nodules, T4b N1a, Stage IIIB, grade IIII,   07/07/2015 Imaging   Per records negative CT C/A/P and bone scan   08/15/2015 - 11/22/2015 Chemotherapy   AC x 4, Taxol X 4 both given dose dense    - 01/27/2016 Radiation Therapy   Dr. Harlow Ohms. [place] [name]   02/06/2016 Imaging   Bone density- BMD as determined from [facility] L1-L4 is 1.021 g/cm2 with a T-Score of -1.3. This patient is considered osteopenic according to Carly [name]) criteria.       INTERVAL HISTORY:  Carly [name] 39 y.o. female was called for telephone visit today for recurrent breast cancer.  Patient reports she is doing well since her last visit.  She reports she is taking her medication as prescribed with no side effects.  She denies any new lumps or bumps present.  She denies any new bone pain. Denies any nausea, vomiting, or diarrhea. Denies any new pains. Had not noticed any recent bleeding such as epistaxis, hematuria or hematochezia. Denies recent chest pain on exertion, shortness of breath on minimal exertion, pre-syncopal episodes, or palpitations. Denies any numbness or tingling in hands or feet. Denies any recent fevers, infections, or recent  hospitalizations. Patient reports appetite at 100% and energy level at 75%.  She is eating well maintain her weight at this time.     REVIEW OF SYSTEMS:  Review of Systems  All other systems reviewed and are negative.    PAST MEDICAL/SURGICAL HISTORY:  Past Medical History:  Diagnosis Date  . Breast cancer (Carly [name]) 2013   BRCA1 positive   Past Surgical History:  Procedure Laterality Date  . ABDOMINAL HYSTERECTOMY    . CESAREAN SECTION    . MASTECTOMY       SOCIAL HISTORY:  Social History   Socioeconomic History  . Marital status: Single    Spouse name: Not on file  . Number of children: Not on file  . Years of education: Not on file  . Highest education level: Not on file  Occupational History  . Not on file  Tobacco Use  . Smoking status: Never Smoker  . Smokeless tobacco: Never Used  Substance and Sexual Activity  . Alcohol use: No  . Drug use: No  . Sexual activity: Not on file  Other Topics Concern  . Not on file  Social History Narrative  . Not on file   Social Determinants of Health   Financial Resource Strain:   . Difficulty of Paying Living Expenses:   Food Insecurity:   . Worried About Charity fundraiser in the Last Year:   . Arboriculturist in the Last Year:   Transportation Needs:   . Film/video editor (Medical):   Marland Kitchen  Lack of Transportation (Non-Medical):   Physical Activity:   . Days of Exercise per Week:   . Minutes of Exercise per Session:   Stress:   . Feeling of Stress :   Social Connections:   . Frequency of Communication with Friends and Family:   . Frequency of Social Gatherings with Friends and Family:   . Attends Religious Services:   . Active Member of Clubs or Organizations:   . Attends Archivist Meetings:   Marland Kitchen Marital Status:   Intimate Partner Violence:   . Fear of Current or Ex-Partner:   . Emotionally Abused:   Marland Kitchen Physically Abused:   . Sexually Abused:     FAMILY HISTORY:  Family History  Problem  Relation Age of Onset  . Breast cancer Mother 47  . BRCA 1/2 Mother   . Prostate cancer Father        metastatic  . Breast cancer Sister 2       BRCA1 mutation  . Breast cancer Maternal Aunt        dx in her 9s; BRCA1 mutation  . Heart attack Maternal Grandfather   . Ovarian cancer Paternal Grandmother        dx in her 47s  . Heart attack Paternal Grandfather   . Healthy Sister   . BRCA 1/2 Maternal Aunt   . Healthy Maternal Aunt        negative for a BRCA mutation  . Healthy Maternal Aunt        negative for a BRCA mutation    CURRENT MEDICATIONS:  Outpatient Encounter Medications as of 09/30/2019  Medication Sig Note  . ALPRAZolam (XANAX) 0.5 MG tablet TAKE ONE TABLET BY MOUTH AT BEDTIME AS NEEDED FOR ANXIETY.   Marland Kitchen amitriptyline (ELAVIL) 25 MG tablet Take 25 mg by mouth. Pt takes 1-3 tablets at bedtime   . anastrozole (ARIMIDEX) 1 MG tablet TAKE ONE TABLET BY MOUTH DAILY   . ASPIRIN ADULT LOW STRENGTH 81 MG EC tablet Take 81 mg by mouth daily.   Marland Kitchen azithromycin (ZITHROMAX) 250 MG tablet TAKE TWO (2) TABLETS ON FIRST DAY, THEN TAKE ONE (1) TABLET EACH DAY FOR THE NEXT 4 DAYS   . bisoprolol-hydrochlorothiazide (ZIAC) 5-6.25 MG tablet Take 1 tablet by mouth daily.   . Calcium-Magnesium-Vitamin D (CALCIUM 500 PO) Take by mouth daily.   . cholecalciferol (VITAMIN D) 25 MCG (1000 UNIT) tablet Take 1,000 Units by mouth daily.   . cyclobenzaprine (FLEXERIL) 5 MG tablet Take 5 mg by mouth 3 (three) times daily as needed.  01/26/2016: Received from: Carly [name]: Take one tablet (5 mg total) by mouth every 4 (four) hours as needed for Muscle spasms.  Marland Kitchen dexamethasone (DECADRON) 4 MG tablet Take 6 mg by mouth daily.   Marland Kitchen docusate sodium (COLACE) 100 MG capsule Take by mouth. 01/26/2016: Received from: Carly [name]: Take one capsule (100 mg total) by mouth 2 (two) times daily.  . DULoxetine (CYMBALTA) 30 MG capsule Take 30 mg by mouth daily.    . DULoxetine (CYMBALTA) 60  MG capsule Take 1 capsule (60 mg total) by mouth daily. 12/12/2016: 79m daily  . gabapentin (NEURONTIN) 600 MG tablet Take 900 mg 2 (two) times daily by mouth. Take 900 mg twice daily, take 600 mg at noon   . HYDROcodone-acetaminophen (NORCO) 10-325 MG tablet Take 1 tablet by mouth every 4 (four) hours as needed.   . methocarbamol (ROBAXIN) 500 MG tablet Take 500 mg by  mouth 3 (three) times daily.   . Misc. Devices MISC Please provide patient with a compression therapy pump fot her Right arm lymphedema.   . Multiple Vitamins-Minerals (MULTIVITAMIN ADULT PO) Take 1 tablet daily by mouth.   . ondansetron (ZOFRAN) 4 MG tablet TAKE ONE TABLET BY MOUTH EVERY 6 HOURS AS NEEDED FOR NAUSEA 01/26/2016: Received from: [name]  . oxyCODONE (OXY IR/ROXICODONE) 5 MG immediate release tablet TAKE ONE TABLET BY MOUTH EVERY 4 HOURS.   Marland Kitchen pantoprazole (PROTONIX) 40 MG tablet Take 1 tablet (40 mg total) by mouth daily.   . prochlorperazine (COMPAZINE) 10 MG tablet Take 10 mg by mouth as needed.  01/26/2016: Received from: [place]: Take 10 mg by mouth every 6 (six) hours as needed.  . promethazine (PHENERGAN) 12.5 MG tablet Take by mouth. 01/26/2016: Received from: [place]: Take 12.5 mg by mouth every 6 (six) hours as needed for Nausea.  . promethazine (PHENERGAN) 25 MG tablet Take 25 mg by mouth every 6 (six) hours as needed.   . sodium fluoride (FLUORISHIELD) 1.1 % GEL dental gel SF 5000 Plus 1.1 % dental cream   . triamterene-hydrochlorothiazide (MAXZIDE-25) 37.5-25 MG tablet Take one tab daily as needed for swelling.   . valACYclovir (VALTREX) 1000 MG tablet Take 2 tablets by mouth as needed.    . Zinc Chelated 50 MG TABS Take 1 tablet by mouth daily.    No facility-administered encounter medications on file as of 09/30/2019.    ALLERGIES:  Allergies  Allergen Reactions  . Latex Rash  . Other Rash    Oozing blistery rash  . Sumatriptan Other (See Comments)    Other  reaction(s): Unknown Numbness of face and arms  . Tape Rash    Oozing blistery rash     Vital signs: -Deferred due to telephone visit.   Physical Exam -Deferred due to telephone visit -Patient was alert and oriented over the phone and in no acute distress.   LABORATORY DATA:  I have reviewed the labs as listed.  CBC    Component Value Date/Time   WBC 4.0 09/22/2019 1324   RBC 4.10 09/22/2019 1324   HGB 12.1 09/22/2019 1324   HCT 37.0 09/22/2019 1324   PLT 168 09/22/2019 1324   MCV 90.2 09/22/2019 1324   MCH 29.5 09/22/2019 1324   MCHC 32.7 09/22/2019 1324   RDW 12.9 09/22/2019 1324   LYMPHSABS 1.2 09/22/2019 1324   MONOABS 0.3 09/22/2019 1324   EOSABS 0.1 09/22/2019 1324   BASOSABS 0.0 09/22/2019 1324   CMP Latest Ref Rng & Units 09/22/2019 07/13/2019 03/12/2019  Glucose 70 - 99 mg/dL 114(H) 103(H) 92  BUN 6 - 20 mg/dL _0 Creatinine 0.44 - 1.00 mg/dL 0.55 0.72 0.62  Sodium 135 - 145 mmol/L 140 139 139  Potassium 3.5 - 5.1 mmol/L 4.1 4.0 4.1  Chloride 98 - 111 mmol/L 108 104 105  CO2 22 - 32 mmol/L _1 Calcium 8.9 - 10.3 mg/dL 9.0 9.3 9.0  Total Protein 6.5 - 8.1 g/dL 7.2 7.3 7.4  Total Bilirubin 0.3 - 1.2 mg/dL 0.8 0.9 0.6  Alkaline Phos 38 - 126 U/L 39 66 43  AST 15 - 41 U/L _2 ALT 0 - 44 U/L _3 All questions were answered to patient's stated satisfaction. Encouraged patient to call with any new concerns or questions before his next visit to the cancer [name] and we  can certain see him sooner, if needed.     ASSESSMENT & PLAN:   Recurrent breast cancer ([place]) 1. 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  She had cancer of the right breast in 2017 and had lymph node dissection.  Surgeries were done in [place]. -Adjuvant chemotherapy with dose dense AC followed by Taxol from  08/15/2015 through 11/22/2015. -XRT completed on 01/27/2016. -Femara started in August 2017, switch to anastrozole around August 2018 for joint pains which have improved. -TAH and BSO in 2016 prophylactically for her BRCA1 positivity. -Patient complained of right sided lateral rib pain.  Bone scan was ordered. -Bone scan dated 12/16/2018 did not show any evidence of metastatic disease. -Labs done on 09/22/2019 showed hemoglobin 12.1, platelets 168, and CA 15-3 was 16.6. -She will return to the clinic in 6 months with repeat labs.  2.  Osteopenia: -DEXA scan on 04/23/2018 showed T score of -1.6. -She was started on Prolia every 6 months in 05/2016.  3.  Anxiety/sleep problems: -She is taking Xanax 0.5 mg at bedtime.  4.  Family history: -Mother had breast cancer at age 49. -Sister had breast cancer at age 66. -Maternal aunt had breast cancer at age 56. -Father died of metastatic prostate cancer. -Paternal grandmother had metastatic cervical cancer.      Orders placed this encounter:  Orders Placed This Encounter  Procedures  . Lactate dehydrogenase  . CBC with Differential/Platelet  . Comprehensive metabolic panel  . Ferritin  . Iron and TIBC  . Vitamin B12  . VITAMIN D 25 Hydroxy (Vit-D Deficiency, Fractures)  . Folate  . Cancer antigen 15-3    I provided 20 minutes of non face-to-face telephone visit time during this encounter, and > 50% was spent counseling as documented under my assessment & plan.  Francene Finders, FNP-C [place] [phone]

## 2019-09-30 NOTE — Patient Instructions (Signed)
[facility] Discharge Instructions  Follow up in 6 months with labs    Thank you for choosing [facility] at [facility] to provide your oncology and hematology care.  To afford each patient quality time with our provider, please arrive at least 15 minutes before your scheduled appointment time.   If you have a lab appointment with the Cancer Center please come in thru the Main Entrance and check in at the main information desk.  You need to re-schedule your appointment should you arrive 10 or more minutes late.  We strive to give you quality time with our providers, and arriving late affects you and other patients whose appointments are after yours.  Also, if you no show three or more times for appointments you may be dismissed from the clinic at the providers discretion.     Again, thank you for choosing [facility].  Our hope is that these requests will decrease the amount of time that you wait before being seen by our physicians.       _____________________________________________________________  Should you have questions after your visit to [facility], please contact our office at [phone] between the hours of 8:00 a.m. and 4:30 p.m.  Voicemails left after 4:00 p.m. will not be returned until the following business day.  For prescription refill requests, have your pharmacy contact our office and allow 72 hours.    Due to Covid, you will need to wear a mask upon entering the hospital. If you do not have a mask, a mask will be given to you at the Main Entrance upon arrival. For doctor visits, patients may have 1 support person with them. For treatment visits, patients can not have anyone with them due to social distancing guidelines and our immunocompromised population.      

## 2019-09-30 NOTE — Assessment & Plan Note (Addendum)
1. 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  She had cancer of the right breast in 2017 and had lymph node dissection.  Surgeries were done in [place]. -Adjuvant chemotherapy with dose dense AC followed by Taxol from 08/15/2015 through 11/22/2015. -XRT completed on 01/27/2016. -Femara started in August 2017, switch to anastrozole around August 2018 for joint pains which have improved. -TAH and BSO in 2016 prophylactically for her BRCA1 positivity. -Patient complained of right sided lateral rib pain.  Bone scan was ordered. -Bone scan dated 12/16/2018 did not show any evidence of metastatic disease. -Labs done on 09/22/2019 showed hemoglobin 12.1, platelets 168, and CA 15-3 was 16.6. -She will return to the clinic in 6 months with repeat labs.  2.  Osteopenia: -DEXA scan on 04/23/2018 showed T score of -1.6. -She was started on Prolia every 6 months in 05/2016.  3.  Anxiety/sleep problems: -She is taking Xanax 0.5 mg at bedtime.  4.  Family history: -Mother had breast cancer at age 60. -Sister had breast cancer at age 51. -Maternal aunt had breast cancer at age 56. -Father died of metastatic prostate cancer. -Paternal grandmother had metastatic cervical cancer.

## 2019-10-06 DIAGNOSIS — Z452 Encounter for adjustment and management of vascular access device: Secondary | ICD-10-CM | POA: Diagnosis not present

## 2019-11-13 ENCOUNTER — Other Ambulatory Visit (HOSPITAL_COMMUNITY): Payer: Self-pay | Admitting: Hematology

## 2019-11-13 DIAGNOSIS — C50911 Malignant neoplasm of unspecified site of right female breast: Secondary | ICD-10-CM

## 2019-11-17 DIAGNOSIS — Z452 Encounter for adjustment and management of vascular access device: Secondary | ICD-10-CM | POA: Insufficient documentation

## 2019-11-27 DIAGNOSIS — Z0000 Encounter for general adult medical examination without abnormal findings: Secondary | ICD-10-CM | POA: Diagnosis not present

## 2019-11-27 DIAGNOSIS — F339 Major depressive disorder, recurrent, unspecified: Secondary | ICD-10-CM | POA: Diagnosis not present

## 2019-11-27 DIAGNOSIS — Z6841 Body Mass Index (BMI) 40.0 and over, adult: Secondary | ICD-10-CM | POA: Diagnosis not present

## 2019-11-27 DIAGNOSIS — M545 Low back pain: Secondary | ICD-10-CM | POA: Diagnosis not present

## 2019-12-22 DIAGNOSIS — T451X5A Adverse effect of antineoplastic and immunosuppressive drugs, initial encounter: Secondary | ICD-10-CM | POA: Diagnosis not present

## 2019-12-22 DIAGNOSIS — G620 Drug-induced polyneuropathy: Secondary | ICD-10-CM | POA: Diagnosis not present

## 2019-12-29 DIAGNOSIS — Z452 Encounter for adjustment and management of vascular access device: Secondary | ICD-10-CM | POA: Diagnosis not present

## 2020-01-08 ENCOUNTER — Other Ambulatory Visit (HOSPITAL_COMMUNITY): Payer: Self-pay | Admitting: *Deleted

## 2020-01-08 DIAGNOSIS — M8580 Other specified disorders of bone density and structure, unspecified site: Secondary | ICD-10-CM

## 2020-01-08 DIAGNOSIS — C50919 Malignant neoplasm of unspecified site of unspecified female breast: Secondary | ICD-10-CM

## 2020-01-11 ENCOUNTER — Inpatient Hospital Stay (HOSPITAL_COMMUNITY): Payer: PPO

## 2020-01-11 ENCOUNTER — Ambulatory Visit (HOSPITAL_COMMUNITY): Payer: PPO

## 2020-01-12 ENCOUNTER — Ambulatory Visit (HOSPITAL_COMMUNITY): Payer: PPO

## 2020-01-12 ENCOUNTER — Inpatient Hospital Stay (HOSPITAL_COMMUNITY): Payer: PPO

## 2020-02-09 DIAGNOSIS — Z452 Encounter for adjustment and management of vascular access device: Secondary | ICD-10-CM | POA: Diagnosis not present

## 2020-02-22 ENCOUNTER — Other Ambulatory Visit (HOSPITAL_COMMUNITY): Payer: Self-pay | Admitting: Hematology

## 2020-02-22 DIAGNOSIS — Z1509 Genetic susceptibility to other malignant neoplasm: Secondary | ICD-10-CM

## 2020-02-22 DIAGNOSIS — C50911 Malignant neoplasm of unspecified site of right female breast: Secondary | ICD-10-CM

## 2020-03-01 DIAGNOSIS — Z6841 Body Mass Index (BMI) 40.0 and over, adult: Secondary | ICD-10-CM | POA: Diagnosis not present

## 2020-03-01 DIAGNOSIS — M545 Low back pain: Secondary | ICD-10-CM | POA: Diagnosis not present

## 2020-03-01 DIAGNOSIS — F339 Major depressive disorder, recurrent, unspecified: Secondary | ICD-10-CM | POA: Diagnosis not present

## 2020-03-21 DIAGNOSIS — T451X5A Adverse effect of antineoplastic and immunosuppressive drugs, initial encounter: Secondary | ICD-10-CM | POA: Diagnosis not present

## 2020-03-21 DIAGNOSIS — G620 Drug-induced polyneuropathy: Secondary | ICD-10-CM | POA: Diagnosis not present

## 2020-03-22 DIAGNOSIS — Z452 Encounter for adjustment and management of vascular access device: Secondary | ICD-10-CM | POA: Diagnosis not present

## 2020-03-29 ENCOUNTER — Other Ambulatory Visit (HOSPITAL_COMMUNITY): Payer: Self-pay

## 2020-03-29 DIAGNOSIS — C50919 Malignant neoplasm of unspecified site of unspecified female breast: Secondary | ICD-10-CM

## 2020-03-29 DIAGNOSIS — M8580 Other specified disorders of bone density and structure, unspecified site: Secondary | ICD-10-CM

## 2020-03-29 DIAGNOSIS — C50511 Malignant neoplasm of lower-outer quadrant of right female breast: Secondary | ICD-10-CM

## 2020-03-30 ENCOUNTER — Inpatient Hospital Stay (HOSPITAL_COMMUNITY): Payer: PPO | Attending: Hematology

## 2020-03-30 ENCOUNTER — Ambulatory Visit (HOSPITAL_COMMUNITY): Payer: PPO

## 2020-03-30 DIAGNOSIS — Z79899 Other long term (current) drug therapy: Secondary | ICD-10-CM | POA: Insufficient documentation

## 2020-03-30 DIAGNOSIS — F419 Anxiety disorder, unspecified: Secondary | ICD-10-CM | POA: Insufficient documentation

## 2020-03-30 DIAGNOSIS — Z170 Estrogen receptor positive status [ER+]: Secondary | ICD-10-CM | POA: Insufficient documentation

## 2020-03-30 DIAGNOSIS — M8580 Other specified disorders of bone density and structure, unspecified site: Secondary | ICD-10-CM | POA: Insufficient documentation

## 2020-03-30 DIAGNOSIS — Z90722 Acquired absence of ovaries, bilateral: Secondary | ICD-10-CM | POA: Insufficient documentation

## 2020-03-30 DIAGNOSIS — Z9221 Personal history of antineoplastic chemotherapy: Secondary | ICD-10-CM | POA: Insufficient documentation

## 2020-03-30 DIAGNOSIS — E538 Deficiency of other specified B group vitamins: Secondary | ICD-10-CM | POA: Insufficient documentation

## 2020-03-30 DIAGNOSIS — G4700 Insomnia, unspecified: Secondary | ICD-10-CM | POA: Insufficient documentation

## 2020-03-30 DIAGNOSIS — Z79811 Long term (current) use of aromatase inhibitors: Secondary | ICD-10-CM | POA: Insufficient documentation

## 2020-03-30 DIAGNOSIS — G629 Polyneuropathy, unspecified: Secondary | ICD-10-CM | POA: Insufficient documentation

## 2020-03-30 DIAGNOSIS — Z923 Personal history of irradiation: Secondary | ICD-10-CM | POA: Insufficient documentation

## 2020-03-30 DIAGNOSIS — C50411 Malignant neoplasm of upper-outer quadrant of right female breast: Secondary | ICD-10-CM | POA: Insufficient documentation

## 2020-03-30 DIAGNOSIS — Z90710 Acquired absence of both cervix and uterus: Secondary | ICD-10-CM | POA: Insufficient documentation

## 2020-03-31 ENCOUNTER — Other Ambulatory Visit (HOSPITAL_COMMUNITY): Payer: PPO

## 2020-04-06 ENCOUNTER — Inpatient Hospital Stay (HOSPITAL_COMMUNITY): Payer: PPO

## 2020-04-06 ENCOUNTER — Encounter (HOSPITAL_COMMUNITY): Payer: Self-pay

## 2020-04-06 ENCOUNTER — Other Ambulatory Visit: Payer: Self-pay

## 2020-04-07 ENCOUNTER — Telehealth (HOSPITAL_COMMUNITY): Payer: PPO | Admitting: Hematology

## 2020-04-08 ENCOUNTER — Other Ambulatory Visit: Payer: Self-pay

## 2020-04-08 ENCOUNTER — Inpatient Hospital Stay (HOSPITAL_COMMUNITY): Payer: PPO | Attending: Hematology

## 2020-04-08 DIAGNOSIS — D509 Iron deficiency anemia, unspecified: Secondary | ICD-10-CM | POA: Insufficient documentation

## 2020-04-08 DIAGNOSIS — E538 Deficiency of other specified B group vitamins: Secondary | ICD-10-CM | POA: Insufficient documentation

## 2020-04-08 DIAGNOSIS — C50511 Malignant neoplasm of lower-outer quadrant of right female breast: Secondary | ICD-10-CM

## 2020-04-08 DIAGNOSIS — C50919 Malignant neoplasm of unspecified site of unspecified female breast: Secondary | ICD-10-CM

## 2020-04-08 DIAGNOSIS — C50911 Malignant neoplasm of unspecified site of right female breast: Secondary | ICD-10-CM | POA: Insufficient documentation

## 2020-04-08 DIAGNOSIS — Z170 Estrogen receptor positive status [ER+]: Secondary | ICD-10-CM | POA: Diagnosis not present

## 2020-04-08 DIAGNOSIS — M8580 Other specified disorders of bone density and structure, unspecified site: Secondary | ICD-10-CM | POA: Diagnosis not present

## 2020-04-08 LAB — IRON AND TIBC
Iron: 67 ug/dL (ref 28–170)
Saturation Ratios: 21 % (ref 10.4–31.8)
TIBC: 325 ug/dL (ref 250–450)
UIBC: 258 ug/dL

## 2020-04-08 LAB — COMPREHENSIVE METABOLIC PANEL
ALT: 16 U/L (ref 0–44)
AST: 16 U/L (ref 15–41)
Albumin: 4.2 g/dL (ref 3.5–5.0)
Alkaline Phosphatase: 72 U/L (ref 38–126)
Anion gap: 13 (ref 5–15)
BUN: 12 mg/dL (ref 6–20)
CO2: 26 mmol/L (ref 22–32)
Calcium: 9.9 mg/dL (ref 8.9–10.3)
Chloride: 101 mmol/L (ref 98–111)
Creatinine, Ser: 0.63 mg/dL (ref 0.44–1.00)
GFR, Estimated: >60 mL/min (ref >60)
Glucose, Bld: 91 mg/dL (ref 70–99)
Potassium: 3.8 mmol/L (ref 3.5–5.1)
Sodium: 140 mmol/L (ref 135–145)
Total Bilirubin: 0.8 mg/dL (ref 0.3–1.2)
Total Protein: 7.5 g/dL (ref 6.5–8.1)

## 2020-04-08 LAB — CBC WITH DIFFERENTIAL/PLATELET
Abs Immature Granulocytes: 0.01 10*3/uL (ref 0.00–0.07)
Basophils Absolute: 0 10*3/uL (ref 0.0–0.1)
Basophils Relative: 0 %
Eosinophils Absolute: 0.1 10*3/uL (ref 0.0–0.5)
Eosinophils Relative: 1 %
HCT: 37.3 % (ref 36.0–46.0)
Hemoglobin: 12.5 g/dL (ref 12.0–15.0)
Immature Granulocytes: 0 %
Lymphocytes Relative: 36 %
Lymphs Abs: 1.6 10*3/uL (ref 0.7–4.0)
MCH: 29.5 pg (ref 26.0–34.0)
MCHC: 33.5 g/dL (ref 30.0–36.0)
MCV: 88 fL (ref 80.0–100.0)
Monocytes Absolute: 0.4 10*3/uL (ref 0.1–1.0)
Monocytes Relative: 10 %
Neutro Abs: 2.4 10*3/uL (ref 1.7–7.7)
Neutrophils Relative %: 53 %
Platelets: 176 10*3/uL (ref 150–400)
RBC: 4.24 MIL/uL (ref 3.87–5.11)
RDW: 12.9 % (ref 11.5–15.5)
WBC: 4.5 10*3/uL (ref 4.0–10.5)
nRBC: 0 % (ref 0.0–0.2)

## 2020-04-08 LAB — FERRITIN: Ferritin: 121 ng/mL (ref 11–307)

## 2020-04-08 LAB — LACTATE DEHYDROGENASE: LDH: 168 U/L (ref 98–192)

## 2020-04-08 LAB — VITAMIN B12: Vitamin B-12: 118 pg/mL — ABNORMAL LOW (ref 180–914)

## 2020-04-08 LAB — FOLATE: Folate: 7.9 ng/mL (ref >5.9)

## 2020-04-09 LAB — CANCER ANTIGEN 15-3: CA 15-3: 16.9 U/mL (ref 0.0–25.0)

## 2020-04-09 LAB — VITAMIN D 25 HYDROXY (VIT D DEFICIENCY, FRACTURES): Vit D, 25-Hydroxy: 26.25 ng/mL — ABNORMAL LOW (ref 30–100)

## 2020-04-11 ENCOUNTER — Inpatient Hospital Stay (HOSPITAL_COMMUNITY): Payer: PPO

## 2020-04-11 ENCOUNTER — Other Ambulatory Visit: Payer: Self-pay

## 2020-04-11 ENCOUNTER — Inpatient Hospital Stay (HOSPITAL_BASED_OUTPATIENT_CLINIC_OR_DEPARTMENT_OTHER): Payer: PPO | Admitting: Oncology

## 2020-04-11 VITALS — BP 113/72 | HR 92 | Temp 97.9°F | Resp 16 | Wt 277.3 lb

## 2020-04-11 DIAGNOSIS — C50411 Malignant neoplasm of upper-outer quadrant of right female breast: Secondary | ICD-10-CM | POA: Diagnosis not present

## 2020-04-11 DIAGNOSIS — C50911 Malignant neoplasm of unspecified site of right female breast: Secondary | ICD-10-CM

## 2020-04-11 DIAGNOSIS — Z90722 Acquired absence of ovaries, bilateral: Secondary | ICD-10-CM | POA: Diagnosis not present

## 2020-04-11 DIAGNOSIS — Z923 Personal history of irradiation: Secondary | ICD-10-CM | POA: Diagnosis not present

## 2020-04-11 DIAGNOSIS — Z79811 Long term (current) use of aromatase inhibitors: Secondary | ICD-10-CM | POA: Diagnosis not present

## 2020-04-11 DIAGNOSIS — Z79899 Other long term (current) drug therapy: Secondary | ICD-10-CM | POA: Diagnosis not present

## 2020-04-11 DIAGNOSIS — M8580 Other specified disorders of bone density and structure, unspecified site: Secondary | ICD-10-CM | POA: Diagnosis not present

## 2020-04-11 DIAGNOSIS — G629 Polyneuropathy, unspecified: Secondary | ICD-10-CM | POA: Diagnosis not present

## 2020-04-11 DIAGNOSIS — G4700 Insomnia, unspecified: Secondary | ICD-10-CM | POA: Diagnosis not present

## 2020-04-11 DIAGNOSIS — Z170 Estrogen receptor positive status [ER+]: Secondary | ICD-10-CM | POA: Diagnosis not present

## 2020-04-11 DIAGNOSIS — F419 Anxiety disorder, unspecified: Secondary | ICD-10-CM | POA: Diagnosis not present

## 2020-04-11 DIAGNOSIS — C50919 Malignant neoplasm of unspecified site of unspecified female breast: Secondary | ICD-10-CM | POA: Diagnosis not present

## 2020-04-11 DIAGNOSIS — E538 Deficiency of other specified B group vitamins: Secondary | ICD-10-CM | POA: Diagnosis not present

## 2020-04-11 DIAGNOSIS — Z9221 Personal history of antineoplastic chemotherapy: Secondary | ICD-10-CM | POA: Diagnosis not present

## 2020-04-11 DIAGNOSIS — Z90710 Acquired absence of both cervix and uterus: Secondary | ICD-10-CM | POA: Diagnosis not present

## 2020-04-11 MED ORDER — DENOSUMAB 60 MG/ML ~~LOC~~ SOSY
60.0000 mg | PREFILLED_SYRINGE | Freq: Once | SUBCUTANEOUS | Status: AC
  Administered 2020-04-11: 60 mg via SUBCUTANEOUS

## 2020-04-11 MED ORDER — DENOSUMAB 60 MG/ML ~~LOC~~ SOSY
PREFILLED_SYRINGE | SUBCUTANEOUS | Status: AC
  Filled 2020-04-11: qty 1

## 2020-04-11 NOTE — Assessment & Plan Note (Addendum)
1. 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  She had cancer of the right breast in 2017 and had lymph node dissection.  Surgeries were done in [place]. -Adjuvant chemotherapy with dose dense AC followed by Taxol from 08/15/2015 through 11/22/2015. -XRT completed on 01/27/2016. -Femara started in August 2017, switch to anastrozole around August 2018 for joint pains which have improved. -TAH and BSO in 2016 prophylactically for her BRCA1 positivity. -Patient complained of right sided lateral rib pain.  Bone scan was ordered. -Bone scan dated 12/16/2018 did not show any evidence of metastatic disease. -Labs from 04/08/2020 showed hemoglobin of 12.5, platelet count 276,000, vitamin D 26.25, ferritin 121 and vitamin B12 118.  Reports that she takes vitamin D daily.  She does not take vitamin B12. -She will return to the clinic in 6 months with repeat labs.  2.  Osteopenia: -DEXA scan on 04/23/2018 showed T score of -1.6. -She was started on Prolia every 6 months in 05/2016. --She is due for a repeat DEXA scan.  Orders placed.  We will get this scheduled. -Vitamin D levels are low today at 26.25.  Continue vitamin D supplements.  We will recheck lab work in about 6 months.  3.  Anxiety/sleep problems: -She is taking Xanax 0.5 mg at bedtime.  4.  Low B12 levels -She is symptomatic with significant fatigue. -We will preauth vitamin B12 injections weekly x4 then monthly x6.  Patient is interested in getting the injections to herself if her insurance approves it.  5.  Family history: -Mother had breast cancer at age 40. -Sister had breast cancer at age 73. -Maternal aunt had breast cancer at age 81. -Father died of metastatic prostate cancer. -Paternal grandmother had metastatic cervical cancer.  Disposition- Start vitamin B12 injections weekly x4  then monthly x6.  We will see if she can get these to herself at home. RTC in about 6 months for labs, Prolia injection and MD assessment. DEXA scan in the next couple weeks.

## 2020-04-11 NOTE — Progress Notes (Signed)
[place], [place] [address]   CLINIC:  Medical Oncology/Hematology  PCP:  [name], MD [place] [phone]   REASON FOR VISIT: Follow-up for breast cancer  CURRENT THERAPY: Anastrozole  BRIEF ONCOLOGIC HISTORY:  Oncology History  Recurrent breast cancer ([place])  05/06/2015 Initial Biopsy   Incisional biopsy with Dr. Maryclare Labrador of palpable mass, lower outer quadrant of reconstructed R breast close to nipple ER+ 97%, PR+ 70 (weak) HER 2 - by [facility]   07/06/2015 Surgery   Definitive lumpectomy and LN dissection performed revealing a 3.1 cm primary 1/14 positive LN with a macro metastases, satellite nodules adjacent to the primary, LVI-, however she had skin invasion along with satellite nodules, T4b N1a, Stage IIIB, grade IIII,   07/07/2015 Imaging   Per records negative CT C/A/P and bone scan   08/15/2015 - 11/22/2015 Chemotherapy   AC x 4, Taxol X 4 both given dose dense    - 01/27/2016 Radiation Therapy   Dr. Harlow Ohms. Pablo Ledger   02/06/2016 Imaging   Bone density- BMD as determined from [facility] L1-L4 is 1.021 g/cm2 with a T-Score of -1.3. This patient is considered osteopenic according to [facility]) criteria.       INTERVAL HISTORY:  Carly [name] 39 y.o. female was seen today in clinic for follow-up for recurrent breast cancer.  She reports she is doing well denies any new concerns.  She has persistent joint pain secondary to anastrozole.  Reports her energy level is low.  States her best friend was just diagnosed with lung cancer stage IV and she has been having to bring him to and from all of his visits.  She continues to take Cymbalta for neuropathy along Norco.  She was just started on Requip for restless leg by her PCP. She denies any new lumps or bumps present.  She denies any new bone pain. Denies any nausea, vomiting, or diarrhea. Denies any new pains. Had not noticed any recent  bleeding such as epistaxis, hematuria or hematochezia. Denies recent chest pain on exertion, shortness of breath on minimal exertion, pre-syncopal episodes, or palpitations. Denies any numbness or tingling in hands or feet. Denies any recent fevers, infections, or recent hospitalizations. Patient reports appetite at 100% and energy level at 25%.  She is eating well maintain her weight at this time.   REVIEW OF SYSTEMS:  Review of Systems  Constitutional: Positive for fatigue. Negative for appetite change, fever and unexpected weight change.  HENT:   Negative for nosebleeds, sore throat and trouble swallowing.   Eyes: Negative.   Respiratory: Negative.  Negative for cough, shortness of breath and wheezing.   Cardiovascular: Negative.  Negative for chest pain and leg swelling.  Gastrointestinal: Negative for abdominal pain, blood in stool, constipation, diarrhea, nausea and vomiting.  Endocrine: Negative.   Genitourinary: Negative.  Negative for bladder incontinence, hematuria and nocturia.   Musculoskeletal: Positive for back pain. Negative for flank pain.       Joint pain  Skin: Negative.   Neurological: Negative.  Negative for dizziness, headaches, light-headedness and numbness.  Hematological: Negative.   Psychiatric/Behavioral: Negative for confusion. The patient is nervous/anxious.   All other systems reviewed and are negative.    PAST MEDICAL/SURGICAL HISTORY:  Past Medical History:  Diagnosis Date  . Breast cancer ([place]) 2013   BRCA1 positive   Past Surgical History:  Procedure Laterality Date  . ABDOMINAL HYSTERECTOMY    .  CESAREAN SECTION    . MASTECTOMY       SOCIAL HISTORY:  Social History   Socioeconomic History  . Marital status: Single    Spouse name: Not on file  . Number of children: Not on file  . Years of education: Not on file  . Highest education level: Not on file  Occupational History  . Not on file  Tobacco Use  . Smoking status: Never Smoker  .  Smokeless tobacco: Never Used  Substance and Sexual Activity  . Alcohol use: No  . Drug use: No  . Sexual activity: Not on file  Other Topics Concern  . Not on file  Social History Narrative  . Not on file   Social Determinants of Health   Financial Resource Strain:   . Difficulty of Paying Living Expenses: Not on file  Food Insecurity:   . Worried About Programme researcher, broadcasting/film/video in the Last Year: Not on file  . Ran Out of Food in the Last Year: Not on file  Transportation Needs:   . Lack of Transportation (Medical): Not on file  . Lack of Transportation (Non-Medical): Not on file  Physical Activity:   . Days of Exercise per Week: Not on file  . Minutes of Exercise per Session: Not on file  Stress:   . Feeling of Stress : Not on file  Social Connections:   . Frequency of Communication with Friends and Family: Not on file  . Frequency of Social Gatherings with Friends and Family: Not on file  . Attends Religious Services: Not on file  . Active Member of Clubs or Organizations: Not on file  . Attends Banker Meetings: Not on file  . Marital Status: Not on file  Intimate Partner Violence:   . Fear of Current or Ex-Partner: Not on file  . Emotionally Abused: Not on file  . Physically Abused: Not on file  . Sexually Abused: Not on file    FAMILY HISTORY:  Family History  Problem Relation Age of Onset  . Breast cancer Mother 8  . BRCA 1/2 Mother   . Prostate cancer Father        metastatic  . Breast cancer Sister 65       BRCA1 mutation  . Breast cancer Maternal Aunt        dx in her 35s; BRCA1 mutation  . Heart attack Maternal Grandfather   . Ovarian cancer Paternal Grandmother        dx in her 31s  . Heart attack Paternal Grandfather   . Healthy Sister   . BRCA 1/2 Maternal Aunt   . Healthy Maternal Aunt        negative for a BRCA mutation  . Healthy Maternal Aunt        negative for a BRCA mutation    CURRENT MEDICATIONS:  Outpatient Encounter  Medications as of 04/11/2020  Medication Sig Note  . ALPRAZolam (XANAX) 0.5 MG tablet TAKE ONE TABLET BY MOUTH AT BEDTIME AS NEEDED FOR ANXIETY.   Marland Kitchen amitriptyline (ELAVIL) 25 MG tablet Take 25 mg by mouth. Pt takes 1-3 tablets at bedtime   . anastrozole (ARIMIDEX) 1 MG tablet TAKE ONE TABLET BY MOUTH DAILY   . Calcium-Magnesium-Vitamin D (CALCIUM 500 PO) Take by mouth daily.   . cholecalciferol (VITAMIN D) 25 MCG (1000 UNIT) tablet Take 1,000 Units by mouth daily.   . cyclobenzaprine (FLEXERIL) 5 MG tablet Take 5 mg by mouth 3 (three) times daily as  needed.  01/26/2016: Received from: [place]: Take one tablet (5 mg total) by mouth every 4 (four) hours as needed for Muscle spasms.  Marland Kitchen docusate sodium (COLACE) 100 MG capsule Take by mouth. 01/26/2016: Received from: [place]: Take one capsule (100 mg total) by mouth 2 (two) times daily.  . DULoxetine (CYMBALTA) 30 MG capsule Take 30 mg by mouth daily.    . DULoxetine (CYMBALTA) 60 MG capsule Take 1 capsule (60 mg total) by mouth daily. 12/12/2016: 61m daily  . gabapentin (NEURONTIN) 600 MG tablet Take 900 mg 2 (two) times daily by mouth. Take 900 mg twice daily, take 600 mg at noon   . HYDROcodone-acetaminophen (NORCO) 10-325 MG tablet Take 1 tablet by mouth every 4 (four) hours as needed.   . methocarbamol (ROBAXIN) 500 MG tablet Take 500 mg by mouth 3 (three) times daily.   . Misc. Devices MISC Please provide patient with a compression therapy pump fot her Right arm lymphedema.   . Multiple Vitamins-Minerals (MULTIVITAMIN ADULT PO) Take 1 tablet daily by mouth.   . ondansetron (ZOFRAN) 4 MG tablet TAKE ONE TABLET BY MOUTH EVERY 6 HOURS AS NEEDED FOR NAUSEA 01/26/2016: [facility] phentermine (ADIPEX-P) 37.5 MG tablet Take 37.5 mg by mouth daily.   .Marland KitchenrOPINIRole (REQUIP) 2 MG tablet Take 2 mg by mouth at bedtime.   . sodium fluoride (FLUORISHIELD) 1.1 % GEL dental gel SF 5000 Plus 1.1 % dental cream    . Zinc Chelated 50 MG TABS Take 1 tablet by mouth daily.   . prochlorperazine (COMPAZINE) 10 MG tablet Take 10 mg by mouth as needed.  (Patient not taking: Reported on 04/11/2020) 01/26/2016: Received from: [place] Take 10 mg by mouth every 6 (six) hours as needed.  . promethazine (PHENERGAN) 12.5 MG tablet Take by mouth. (Patient not taking: Reported on 04/11/2020) 01/26/2016: [place]: [place] Take 12.5 mg by mouth every 6 (six) hours as needed for Nausea.  . promethazine (PHENERGAN) 25 MG tablet Take 25 mg by mouth every 6 (six) hours as needed. (Patient not taking: Reported on 04/11/2020)   . valACYclovir (VALTREX) 1000 MG tablet Take 2 tablets by mouth as needed.  (Patient not taking: Reported on 04/11/2020)   . [DISCONTINUED] ASPIRIN ADULT LOW STRENGTH 81 MG EC tablet Take 81 mg by mouth daily. (Patient not taking: Reported on 04/11/2020)   . [DISCONTINUED] azithromycin (ZITHROMAX) 250 MG tablet TAKE TWO (2) TABLETS ON FIRST DAY, THEN TAKE ONE (1) TABLET EACH DAY FOR THE NEXT 4 DAYS (Patient not taking: Reported on 04/11/2020)   . [DISCONTINUED] bisoprolol-hydrochlorothiazide (ZIAC) 5-6.25 MG tablet Take 1 tablet by mouth daily. (Patient not taking: Reported on 04/11/2020)   . [DISCONTINUED] dexamethasone (DECADRON) 4 MG tablet Take 6 mg by mouth daily.   . [DISCONTINUED] oxyCODONE (OXY IR/ROXICODONE) 5 MG immediate release tablet TAKE ONE TABLET BY MOUTH EVERY 4 HOURS.   . [DISCONTINUED] pantoprazole (PROTONIX) 40 MG tablet Take 1 tablet (40 mg total) by mouth daily.   . [DISCONTINUED] triamterene-hydrochlorothiazide (MAXZIDE-25) 37.5-25 MG tablet Take one tab daily as needed for swelling.    No facility-administered encounter medications on file as of 04/11/2020.    ALLERGIES:  Allergies  Allergen Reactions  . Latex Rash  . Other Rash    Oozing blistery rash  . Sumatriptan Other (See Comments)    Other reaction(s): Unknown Numbness of face  and arms  . Tape Rash  Oozing blistery rash     Vital signs:   Physical Exam Constitutional:      Appearance: Normal appearance.     Comments: Obese  HENT:     Head: Normocephalic and atraumatic.  Eyes:     Pupils: Pupils are equal, round, and reactive to light.  Cardiovascular:     Rate and Rhythm: Normal rate and regular rhythm.     Heart sounds: Normal heart sounds. No murmur heard.   Pulmonary:     Effort: Pulmonary effort is normal.     Breath sounds: Normal breath sounds. No wheezing.  Abdominal:     General: Bowel sounds are normal. There is no distension.     Palpations: Abdomen is soft.     Tenderness: There is no abdominal tenderness.  Musculoskeletal:        General: Normal range of motion.     Cervical back: Normal range of motion.  Skin:    General: Skin is warm and dry.     Findings: No rash.  Neurological:     Mental Status: She is alert and oriented to person, place, and time.  Psychiatric:        Judgment: Judgment normal.      LABORATORY DATA:  I have reviewed the labs as listed.  CBC    Component Value Date/Time   WBC 4.5 04/08/2020 1424   RBC 4.24 04/08/2020 1424   HGB 12.5 04/08/2020 1424   HCT 37.3 04/08/2020 1424   PLT 176 04/08/2020 1424   MCV 88.0 04/08/2020 1424   MCH 29.5 04/08/2020 1424   MCHC 33.5 04/08/2020 1424   RDW 12.9 04/08/2020 1424   LYMPHSABS 1.6 04/08/2020 1424   MONOABS 0.4 04/08/2020 1424   EOSABS 0.1 04/08/2020 1424   BASOSABS 0.0 04/08/2020 1424   CMP Latest Ref Rng & Units 04/08/2020 09/22/2019 07/13/2019  Glucose 70 - 99 mg/dL 91 114(H) 103(H)  BUN 6 - 20 mg/dL _0 Creatinine 0.44 - 1.00 mg/dL 0.63 0.55 0.72  Sodium 135 - 145 mmol/L 140 140 139  Potassium 3.5 - 5.1 mmol/L 3.8 4.1 4.0  Chloride 98 - 111 mmol/L 101 108 104  CO2 22 - 32 mmol/L _1 Calcium 8.9 - 10.3 mg/dL 9.9 9.0 9.3  Total Protein 6.5 - 8.1 g/dL 7.5 7.2 7.3  Total Bilirubin 0.3 - 1.2 mg/dL 0.8 0.8 0.9  Alkaline Phos 38 - 126  U/L 72 39 66  AST 15 - 41 U/L _2 ALT 0 - 44 U/L _3 All questions were answered to patient's stated satisfaction. Encouraged patient to call with any new concerns or questions before his next visit to the cancer center and we can certain see him sooner, if needed.     ASSESSMENT & PLAN:   Recurrent breast cancer ([place]) 1. 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  She had cancer of the right breast in 2017 and had lymph node dissection.  Surgeries were done in [place]. -Adjuvant chemotherapy with dose dense AC followed by Taxol from 08/15/2015 through 11/22/2015. -XRT completed on 01/27/2016. -Femara started in August 2017, switch to anastrozole around August 2018 for joint pains which have improved. -TAH and BSO in 2016 prophylactically for her BRCA1 positivity. -Patient complained of right sided lateral rib pain.  Bone scan was ordered. -Bone scan dated 12/16/2018  did not show any evidence of metastatic disease. -Labs from 04/08/2020 showed hemoglobin of 12.5, platelet count 276,000, vitamin D 26.25, ferritin 121 and vitamin B12 118.  Reports that she takes vitamin D daily.  She does not take vitamin B12. -She will return to the clinic in 6 months with repeat labs.  2.  Osteopenia: -DEXA scan on 04/23/2018 showed T score of -1.6. -She was started on Prolia every 6 months in 05/2016. --She is due for a repeat DEXA scan.  Orders placed.  We will get this scheduled. -Vitamin D levels are low today at 26.25.  Continue vitamin D supplements.  We will recheck lab work in about 6 months.  3.  Anxiety/sleep problems: -She is taking Xanax 0.5 mg at bedtime.  4.  Low B12 levels -She is symptomatic with significant fatigue. -We will preauth vitamin B12 injections weekly x4 then monthly x6.  Patient is interested in getting the  injections to herself if her insurance approves it.  5.  Family history: -Mother had breast cancer at age 46. -Sister had breast cancer at age 17. -Maternal aunt had breast cancer at age 40. -Father died of metastatic prostate cancer. -Paternal grandmother had metastatic cervical cancer.  Disposition- Start vitamin B12 injections weekly x4 then monthly x6.  We will see if she can get these to herself at home. RTC in about 6 months for labs, Prolia injection and MD assessment. DEXA scan in the next couple weeks.      Orders placed this encounter:  Orders Placed This Encounter  Procedures  . DG Bone Density    Greater than 50% was spent in counseling and coordination of care with this patient including but not limited to discussion of the relevant topics above (See A&P) including, but not limited to diagnosis and management of acute and chronic medical conditions.   Faythe Casa, NP 04/11/2020 12:59 PM  [place] [phone]

## 2020-04-11 NOTE — Patient Instructions (Signed)
[facility] at [facility] Discharge Instructions  Received Prolia injection today. Follow-up as scheduled   Thank you for choosing [facility] at [facility] to provide your oncology and hematology care.  To afford each patient quality time with our provider, please arrive at least 15 minutes before your scheduled appointment time.   If you have a lab appointment with the Cancer Center please come in thru the Main Entrance and check in at the main information desk.  You need to re-schedule your appointment should you arrive 10 or more minutes late.  We strive to give you quality time with our providers, and arriving late affects you and other patients whose appointments are after yours.  Also, if you no show three or more times for appointments you may be dismissed from the clinic at the providers discretion.     Again, thank you for choosing [facility].  Our hope is that these requests will decrease the amount of time that you wait before being seen by our physicians.       _____________________________________________________________  Should you have questions after your visit to [facility], please contact our office at [phone] and follow the prompts.  Our office hours are 8:00 a.m. and 4:30 p.m. Monday - Friday.  Please note that voicemails left after 4:00 p.m. may not be returned until the following business day.  We are closed weekends and major holidays.  You do have access to a nurse 24-7, just call the main number to the clinic [phone] and do not press any options, hold on the line and a nurse will answer the phone.    For prescription refill requests, have your pharmacy contact our office and allow 72 hours.    Due to Covid, you will need to wear a mask upon entering the hospital. If you do not have a mask, a mask will be given to you at the Main Entrance upon arrival. For doctor visits, patients may have  1 support person age 18 or older with them. For treatment visits, patients can not have anyone with them due to social distancing guidelines and our immunocompromised population.     

## 2020-04-11 NOTE — Progress Notes (Signed)
[place] reviewed with and pt seen by JBurns NP and pt approved for Prolia injection today per NP                                      Carly [name] tolerated Prolia injection well without complaints or incident. Calcium 9.9 on 04/08/20 and pt denied any tooth or jaw pain and no recent or future dental visits prior to administering this medication.Pt continues to take her Calcium PO as prescribed without issues. VSS Pt discharged self ambulatory in satisfactory condition

## 2020-04-17 IMAGING — NM NUCLEAR MEDICINE WHOLE BODY BONE SCINTIGRAPHY
2 series · 2 of 2 positions shown · non-contrast
Comparison: 03/04/2017

Radiographic correlation: None recent

CLINICAL DATA: Breast cancer

EXAM:
NUCLEAR MEDICINE WHOLE BODY BONE SCAN
TECHNIQUE: Whole body anterior and posterior images were obtained approximately
3 hours after intravenous injection of radiopharmaceutical.
RADIOPHARMACEUTICALS:  18.97 mCi Qechnetium-GGm MDP IV

[Series 1: whole body · 2.66mm/px · 1 of 1 slices shown (1 of 2)]
[im 1/1]
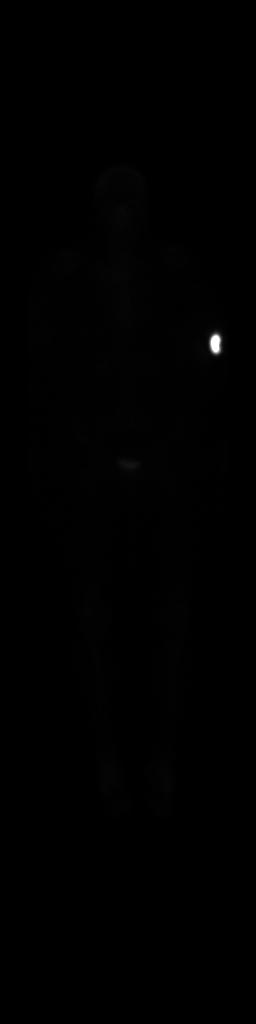

[Series 1: whole body · 2.66mm/px · 1 of 1 slices shown (2 of 2)]
[im 1/1]
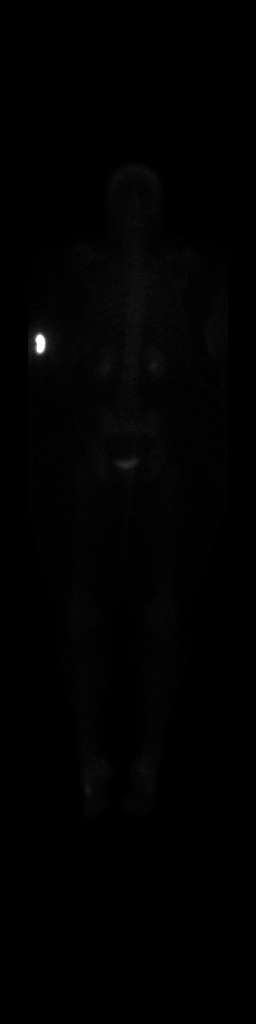

[2 of 2 positions shown; findings below may reference images not displayed]

FINDINGS: Minimal uptake at the RIGHT knee, shoulders and sternoclavicular
joints, likely degenerative.

Uptake at the lateral LEFT foot is again identified, typically
degenerative or posttraumatic.

Sites of uptake seen at the distal femora on the prior exam have
nearly resolved.

No worrisome sites of abnormal osseous tracer accumulation are seen
to suggest osseous metastatic disease.

Focus of tracer extravasation at injection site at the LEFT
antecubital fossa.

Otherwise expected urinary tract and soft tissue distribution of
tracer.
IMPRESSION: No definite scintigraphic evidence of osseous metastatic disease.

## 2020-04-18 ENCOUNTER — Encounter (HOSPITAL_COMMUNITY): Payer: Self-pay

## 2020-04-18 ENCOUNTER — Other Ambulatory Visit: Payer: Self-pay

## 2020-04-18 ENCOUNTER — Inpatient Hospital Stay (HOSPITAL_COMMUNITY): Payer: PPO

## 2020-04-18 VITALS — BP 125/89 | HR 85 | Temp 97.3°F | Resp 16

## 2020-04-18 DIAGNOSIS — C50911 Malignant neoplasm of unspecified site of right female breast: Secondary | ICD-10-CM

## 2020-04-18 DIAGNOSIS — C50411 Malignant neoplasm of upper-outer quadrant of right female breast: Secondary | ICD-10-CM | POA: Diagnosis not present

## 2020-04-18 DIAGNOSIS — M8580 Other specified disorders of bone density and structure, unspecified site: Secondary | ICD-10-CM

## 2020-04-18 DIAGNOSIS — Z79811 Long term (current) use of aromatase inhibitors: Secondary | ICD-10-CM

## 2020-04-18 MED ORDER — CYANOCOBALAMIN 1000 MCG/ML IJ SOLN
1000.0000 ug | Freq: Once | INTRAMUSCULAR | Status: AC
  Administered 2020-04-18: 1000 ug via INTRAMUSCULAR
  Filled 2020-04-18: qty 1

## 2020-04-18 NOTE — Progress Notes (Signed)
Patient tolerated B12 injection with no complaints voiced.  Site clean and dry with no bruising or swelling noted at site.  Band aid applied.  VSS with discharge and left ambulatory with no s/s of distress noted. 

## 2020-04-20 ENCOUNTER — Other Ambulatory Visit: Payer: Self-pay | Admitting: Oncology

## 2020-04-20 NOTE — Progress Notes (Unsigned)
Re:  Vitamin B12 deficiency

## 2020-04-25 ENCOUNTER — Inpatient Hospital Stay (HOSPITAL_COMMUNITY): Payer: PPO

## 2020-04-25 ENCOUNTER — Other Ambulatory Visit: Payer: Self-pay

## 2020-04-25 ENCOUNTER — Inpatient Hospital Stay (HOSPITAL_COMMUNITY): Payer: PPO | Attending: Hematology

## 2020-04-25 ENCOUNTER — Encounter (HOSPITAL_COMMUNITY): Payer: Self-pay

## 2020-04-25 VITALS — BP 146/92 | HR 92 | Temp 97.0°F | Resp 18

## 2020-04-25 DIAGNOSIS — C50911 Malignant neoplasm of unspecified site of right female breast: Secondary | ICD-10-CM

## 2020-04-25 DIAGNOSIS — E538 Deficiency of other specified B group vitamins: Secondary | ICD-10-CM | POA: Insufficient documentation

## 2020-04-25 DIAGNOSIS — Z79811 Long term (current) use of aromatase inhibitors: Secondary | ICD-10-CM

## 2020-04-25 DIAGNOSIS — Z79899 Other long term (current) drug therapy: Secondary | ICD-10-CM | POA: Diagnosis not present

## 2020-04-25 DIAGNOSIS — M8580 Other specified disorders of bone density and structure, unspecified site: Secondary | ICD-10-CM

## 2020-04-25 MED ORDER — CYANOCOBALAMIN 1000 MCG/ML IJ SOLN
1000.0000 ug | Freq: Once | INTRAMUSCULAR | Status: AC
  Administered 2020-04-25: 1000 ug via INTRAMUSCULAR

## 2020-04-25 NOTE — Progress Notes (Signed)
Eddie [place] tolerated Vit B12 injection well without complaints or incident. VSS Pt discharged self ambulatory in satisfactory condition

## 2020-04-25 NOTE — Patient Instructions (Signed)
[place] at [facility] Discharge Instructions  Received Vit B12 injection. Follow-up as scheduled   Thank you for choosing [place] at [facility] to provide your oncology and hematology care.  To afford each patient quality time with our provider, please arrive at least 15 minutes before your scheduled appointment time.   If you have a lab appointment with the [facility] please come in thru the Main Entrance and check in at the main information desk.  You need to re-schedule your appointment should you arrive 10 or more minutes late.  We strive to give you quality time with our providers, and arriving late affects you and other patients whose appointments are after yours.  Also, if you no show three or more times for appointments you may be dismissed from the clinic at the providers discretion.     Again, thank you for choosing [facility].  Our hope is that these requests will decrease the amount of time that you wait before being seen by our physicians.       _____________________________________________________________  Should you have questions after your visit to [facility], please contact our office at [phone] and follow the prompts.  Our office hours are 8:00 a.m. and 4:30 p.m. Monday - Friday.  Please note that voicemails left after 4:00 p.m. may not be returned until the following business day.  We are closed weekends and major holidays.  You do have access to a nurse 24-7, just call the main number to the clinic [phone] and do not press any options, hold on the line and a nurse will answer the phone.    For prescription refill requests, have your pharmacy contact our office and allow 72 hours.    Due to Covid, you will need to wear a mask upon entering the hospital. If you do not have a mask, a mask will be given to you at the Main Entrance upon arrival. For doctor visits, patients may have 1  support person age 63 or older with them. For treatment visits, patients can not have anyone with them due to social distancing guidelines and our immunocompromised population.

## 2020-04-26 ENCOUNTER — Other Ambulatory Visit: Payer: Self-pay | Admitting: Oncology

## 2020-04-26 MED ORDER — CYANOCOBALAMIN 1000 MCG/ML IJ SOLN: 1000.0000 ug | INTRAMUSCULAR | 0 refills | Status: AC

## 2020-04-26 MED ORDER — CYANOCOBALAMIN 1000 MCG/ML IJ SOLN
1000.0000 ug | INTRAMUSCULAR | 0 refills | Status: DC
Start: 2020-04-26 — End: 2020-04-26

## 2020-04-29 ENCOUNTER — Inpatient Hospital Stay (HOSPITAL_COMMUNITY): Admission: RE | Admit: 2020-04-29 | Payer: PPO | Source: Ambulatory Visit

## 2020-05-02 ENCOUNTER — Inpatient Hospital Stay (HOSPITAL_COMMUNITY): Payer: PPO

## 2020-05-03 DIAGNOSIS — Z452 Encounter for adjustment and management of vascular access device: Secondary | ICD-10-CM | POA: Diagnosis not present

## 2020-05-09 ENCOUNTER — Ambulatory Visit (HOSPITAL_COMMUNITY): Payer: PPO

## 2020-05-12 ENCOUNTER — Other Ambulatory Visit (HOSPITAL_COMMUNITY): Payer: PPO

## 2020-05-26 ENCOUNTER — Other Ambulatory Visit (HOSPITAL_COMMUNITY): Payer: Self-pay | Admitting: Hematology

## 2020-05-26 DIAGNOSIS — C50911 Malignant neoplasm of unspecified site of right female breast: Secondary | ICD-10-CM

## 2020-05-31 ENCOUNTER — Other Ambulatory Visit: Payer: Self-pay

## 2020-05-31 ENCOUNTER — Ambulatory Visit (HOSPITAL_COMMUNITY)
Admission: RE | Admit: 2020-05-31 | Discharge: 2020-05-31 | Disposition: A | Discharge status: Home / Self Care | Payer: PPO | Source: Ambulatory Visit | Attending: Oncology | Admitting: Oncology

## 2020-05-31 DIAGNOSIS — M8588 Other specified disorders of bone density and structure, other site: Secondary | ICD-10-CM | POA: Diagnosis not present

## 2020-05-31 DIAGNOSIS — M8589 Other specified disorders of bone density and structure, multiple sites: Secondary | ICD-10-CM | POA: Diagnosis not present

## 2020-05-31 DIAGNOSIS — M85851 Other specified disorders of bone density and structure, right thigh: Secondary | ICD-10-CM | POA: Diagnosis not present

## 2020-05-31 DIAGNOSIS — Z13820 Encounter for screening for osteoporosis: Secondary | ICD-10-CM | POA: Insufficient documentation

## 2020-05-31 DIAGNOSIS — Z780 Asymptomatic menopausal state: Secondary | ICD-10-CM | POA: Diagnosis not present

## 2020-05-31 DIAGNOSIS — Z853 Personal history of malignant neoplasm of breast: Secondary | ICD-10-CM | POA: Diagnosis not present

## 2020-05-31 DIAGNOSIS — Z79811 Long term (current) use of aromatase inhibitors: Secondary | ICD-10-CM | POA: Diagnosis not present

## 2020-06-13 DIAGNOSIS — M5416 Radiculopathy, lumbar region: Secondary | ICD-10-CM | POA: Diagnosis not present

## 2020-06-13 DIAGNOSIS — M898X9 Other specified disorders of bone, unspecified site: Secondary | ICD-10-CM | POA: Diagnosis not present

## 2020-06-13 DIAGNOSIS — T451X5A Adverse effect of antineoplastic and immunosuppressive drugs, initial encounter: Secondary | ICD-10-CM | POA: Diagnosis not present

## 2020-06-13 DIAGNOSIS — G620 Drug-induced polyneuropathy: Secondary | ICD-10-CM | POA: Diagnosis not present

## 2020-06-23 DIAGNOSIS — J40 Bronchitis, not specified as acute or chronic: Secondary | ICD-10-CM | POA: Diagnosis not present

## 2020-06-23 DIAGNOSIS — Z6841 Body Mass Index (BMI) 40.0 and over, adult: Secondary | ICD-10-CM | POA: Diagnosis not present

## 2020-06-27 DIAGNOSIS — Z452 Encounter for adjustment and management of vascular access device: Secondary | ICD-10-CM | POA: Diagnosis not present

## 2020-08-31 DIAGNOSIS — M722 Plantar fascial fibromatosis: Secondary | ICD-10-CM | POA: Diagnosis not present

## 2020-08-31 DIAGNOSIS — Z6841 Body Mass Index (BMI) 40.0 and over, adult: Secondary | ICD-10-CM | POA: Diagnosis not present

## 2020-09-07 DIAGNOSIS — G620 Drug-induced polyneuropathy: Secondary | ICD-10-CM | POA: Diagnosis not present

## 2020-09-07 DIAGNOSIS — M898X9 Other specified disorders of bone, unspecified site: Secondary | ICD-10-CM | POA: Diagnosis not present

## 2020-09-07 DIAGNOSIS — F1120 Opioid dependence, uncomplicated: Secondary | ICD-10-CM | POA: Diagnosis not present

## 2020-09-21 ENCOUNTER — Other Ambulatory Visit (HOSPITAL_COMMUNITY): Payer: Self-pay | Admitting: Hematology

## 2020-09-21 DIAGNOSIS — C50911 Malignant neoplasm of unspecified site of right female breast: Secondary | ICD-10-CM

## 2020-09-21 DIAGNOSIS — Z1509 Genetic susceptibility to other malignant neoplasm: Secondary | ICD-10-CM

## 2020-10-06 ENCOUNTER — Other Ambulatory Visit (HOSPITAL_COMMUNITY): Payer: Self-pay

## 2020-10-06 DIAGNOSIS — C50511 Malignant neoplasm of lower-outer quadrant of right female breast: Secondary | ICD-10-CM

## 2020-10-06 DIAGNOSIS — M8580 Other specified disorders of bone density and structure, unspecified site: Secondary | ICD-10-CM

## 2020-10-10 ENCOUNTER — Inpatient Hospital Stay (HOSPITAL_COMMUNITY): Payer: PPO

## 2020-10-10 ENCOUNTER — Ambulatory Visit (HOSPITAL_COMMUNITY): Payer: PPO

## 2020-10-10 DIAGNOSIS — Z452 Encounter for adjustment and management of vascular access device: Secondary | ICD-10-CM | POA: Diagnosis not present

## 2020-10-11 ENCOUNTER — Other Ambulatory Visit: Payer: Self-pay

## 2020-10-11 ENCOUNTER — Inpatient Hospital Stay (HOSPITAL_COMMUNITY): Payer: PPO | Attending: Hematology

## 2020-10-11 DIAGNOSIS — M8580 Other specified disorders of bone density and structure, unspecified site: Secondary | ICD-10-CM

## 2020-10-11 DIAGNOSIS — C50511 Malignant neoplasm of lower-outer quadrant of right female breast: Secondary | ICD-10-CM | POA: Insufficient documentation

## 2020-10-11 DIAGNOSIS — Z170 Estrogen receptor positive status [ER+]: Secondary | ICD-10-CM | POA: Diagnosis not present

## 2020-10-11 LAB — COMPREHENSIVE METABOLIC PANEL
ALT: 16 U/L (ref 0–44)
AST: 16 U/L (ref 15–41)
Albumin: 3.9 g/dL (ref 3.5–5.0)
Alkaline Phosphatase: 44 U/L (ref 38–126)
Anion gap: 8 (ref 5–15)
BUN: 9 mg/dL (ref 6–20)
CO2: 27 mmol/L (ref 22–32)
Calcium: 9.2 mg/dL (ref 8.9–10.3)
Chloride: 105 mmol/L (ref 98–111)
Creatinine, Ser: 0.64 mg/dL (ref 0.44–1.00)
GFR, Estimated: >60 mL/min (ref >60)
Glucose, Bld: 100 mg/dL — ABNORMAL HIGH (ref 70–99)
Potassium: 3.8 mmol/L (ref 3.5–5.1)
Sodium: 140 mmol/L (ref 135–145)
Total Bilirubin: 0.6 mg/dL (ref 0.3–1.2)
Total Protein: 7.3 g/dL (ref 6.5–8.1)

## 2020-10-11 LAB — CBC WITH DIFFERENTIAL/PLATELET
Abs Immature Granulocytes: 0.01 10*3/uL (ref 0.00–0.07)
Basophils Absolute: 0 10*3/uL (ref 0.0–0.1)
Basophils Relative: 0 %
Eosinophils Absolute: 0.1 10*3/uL (ref 0.0–0.5)
Eosinophils Relative: 2 %
HCT: 36.6 % (ref 36.0–46.0)
Hemoglobin: 12.1 g/dL (ref 12.0–15.0)
Immature Granulocytes: 0 %
Lymphocytes Relative: 43 %
Lymphs Abs: 1.8 10*3/uL (ref 0.7–4.0)
MCH: 29.3 pg (ref 26.0–34.0)
MCHC: 33.1 g/dL (ref 30.0–36.0)
MCV: 88.6 fL (ref 80.0–100.0)
Monocytes Absolute: 0.3 10*3/uL (ref 0.1–1.0)
Monocytes Relative: 7 %
Neutro Abs: 1.9 10*3/uL (ref 1.7–7.7)
Neutrophils Relative %: 48 %
Platelets: 180 10*3/uL (ref 150–400)
RBC: 4.13 MIL/uL (ref 3.87–5.11)
RDW: 13.4 % (ref 11.5–15.5)
WBC: 4.1 10*3/uL (ref 4.0–10.5)
nRBC: 0 % (ref 0.0–0.2)

## 2020-10-11 LAB — VITAMIN B12: Vitamin B-12: 313 pg/mL (ref 180–914)

## 2020-10-11 LAB — FERRITIN: Ferritin: 116 ng/mL (ref 11–307)

## 2020-10-11 LAB — IRON AND TIBC
Iron: 61 ug/dL (ref 28–170)
Saturation Ratios: 21 % (ref 10.4–31.8)
TIBC: 294 ug/dL (ref 250–450)
UIBC: 233 ug/dL

## 2020-10-11 LAB — FOLATE: Folate: 7 ng/mL (ref >5.9)

## 2020-10-11 LAB — LACTATE DEHYDROGENASE: LDH: 170 U/L (ref 98–192)

## 2020-10-11 LAB — VITAMIN D 25 HYDROXY (VIT D DEFICIENCY, FRACTURES): Vit D, 25-Hydroxy: 33.22 ng/mL (ref 30–100)

## 2020-10-12 ENCOUNTER — Inpatient Hospital Stay (HOSPITAL_COMMUNITY): Payer: PPO | Admitting: Hematology

## 2020-10-12 ENCOUNTER — Inpatient Hospital Stay (HOSPITAL_COMMUNITY): Payer: PPO

## 2020-10-12 LAB — CANCER ANTIGEN 15-3: CA 15-3: 20.1 U/mL (ref 0.0–25.0)

## 2020-10-29 ENCOUNTER — Other Ambulatory Visit (HOSPITAL_COMMUNITY): Payer: Self-pay | Admitting: Hematology

## 2020-10-29 DIAGNOSIS — C50911 Malignant neoplasm of unspecified site of right female breast: Secondary | ICD-10-CM

## 2020-11-02 ENCOUNTER — Other Ambulatory Visit (HOSPITAL_COMMUNITY): Payer: Self-pay | Admitting: *Deleted

## 2020-11-02 DIAGNOSIS — C50511 Malignant neoplasm of lower-outer quadrant of right female breast: Secondary | ICD-10-CM

## 2020-11-02 DIAGNOSIS — M8580 Other specified disorders of bone density and structure, unspecified site: Secondary | ICD-10-CM

## 2020-11-03 ENCOUNTER — Inpatient Hospital Stay (HOSPITAL_COMMUNITY): Payer: PPO

## 2020-11-03 ENCOUNTER — Other Ambulatory Visit: Payer: Self-pay

## 2020-11-03 ENCOUNTER — Inpatient Hospital Stay (HOSPITAL_COMMUNITY): Payer: PPO | Attending: Hematology

## 2020-11-03 DIAGNOSIS — Z170 Estrogen receptor positive status [ER+]: Secondary | ICD-10-CM | POA: Diagnosis not present

## 2020-11-03 DIAGNOSIS — M8580 Other specified disorders of bone density and structure, unspecified site: Secondary | ICD-10-CM

## 2020-11-03 DIAGNOSIS — C50311 Malignant neoplasm of lower-inner quadrant of right female breast: Secondary | ICD-10-CM | POA: Diagnosis not present

## 2020-11-03 DIAGNOSIS — Z79899 Other long term (current) drug therapy: Secondary | ICD-10-CM | POA: Insufficient documentation

## 2020-11-03 DIAGNOSIS — C50511 Malignant neoplasm of lower-outer quadrant of right female breast: Secondary | ICD-10-CM

## 2020-11-03 LAB — COMPREHENSIVE METABOLIC PANEL
ALT: 18 U/L (ref 0–44)
AST: 17 U/L (ref 15–41)
Albumin: 3.7 g/dL (ref 3.5–5.0)
Alkaline Phosphatase: 47 U/L (ref 38–126)
Anion gap: 6 (ref 5–15)
BUN: 10 mg/dL (ref 6–20)
CO2: 30 mmol/L (ref 22–32)
Calcium: 9.1 mg/dL (ref 8.9–10.3)
Chloride: 103 mmol/L (ref 98–111)
Creatinine, Ser: 0.63 mg/dL (ref 0.44–1.00)
GFR, Estimated: >60 mL/min (ref >60)
Glucose, Bld: 91 mg/dL (ref 70–99)
Potassium: 3.9 mmol/L (ref 3.5–5.1)
Sodium: 139 mmol/L (ref 135–145)
Total Bilirubin: 0.7 mg/dL (ref 0.3–1.2)
Total Protein: 7.1 g/dL (ref 6.5–8.1)

## 2020-11-03 NOTE — Patient Instructions (Signed)
[facility]  Discharge Instructions: Thank you for choosing [facility] to provide your oncology and hematology care.  If you have a lab appointment with the Cancer Center, please come in thru the Main Entrance and check in at the main information desk.  Wear comfortable clothing and clothing appropriate for easy access to any Portacath or PICC line.   We strive to give you quality time with your provider. You may need to reschedule your appointment if you arrive late (15 or more minutes).  Arriving late affects you and other patients whose appointments are after yours.  Also, if you miss three or more appointments without notifying the office, you may be dismissed from the clinic at the provider's discretion.      For prescription refill requests, have your pharmacy contact our office and allow 72 hours for refills to be completed.        To help prevent nausea and vomiting after your treatment, we encourage you to take your nausea medication as directed.  BELOW ARE SYMPTOMS THAT SHOULD BE REPORTED IMMEDIATELY: *FEVER GREATER THAN 100.4 F (38 C) OR HIGHER *CHILLS OR SWEATING *NAUSEA AND VOMITING THAT IS NOT CONTROLLED WITH YOUR NAUSEA MEDICATION *UNUSUAL SHORTNESS OF BREATH *UNUSUAL BRUISING OR BLEEDING *URINARY PROBLEMS (pain or burning when urinating, or frequent urination) *BOWEL PROBLEMS (unusual diarrhea, constipation, pain near the anus) TENDERNESS IN MOUTH AND THROAT WITH OR WITHOUT PRESENCE OF ULCERS (sore throat, sores in mouth, or a toothache) UNUSUAL RASH, SWELLING OR PAIN  UNUSUAL VAGINAL DISCHARGE OR ITCHING   Items with * indicate a potential emergency and should be followed up as soon as possible or go to the Emergency Department if any problems should occur.  Please show the CHEMOTHERAPY ALERT CARD or IMMUNOTHERAPY ALERT CARD at check-in to the Emergency Department and triage nurse.  Should you have questions after your visit or need to cancel  or reschedule your appointment, please contact [facility] [phone]  and follow the prompts.  Office hours are 8:00 a.m. to 4:30 p.m. Monday - Friday. Please note that voicemails left after 4:00 p.m. may not be returned until the following business day.  We are closed weekends and major holidays. You have access to a nurse at all times for urgent questions. Please call the main number to the clinic [phone] and follow the prompts.  For any non-urgent questions, you may also contact your provider using MyChart. We now offer e-Visits for anyone 18 and older to request care online for non-urgent symptoms. For details visit [email or web address].   Also download the MyChart app! Go to the app store, search "MyChart", open the app, select [facility], and log in with your MyChart username and password.  Due to Covid, a mask is required upon entering the hospital/clinic. If you do not have a mask, one will be given to you upon arrival. For doctor visits, patients may have 1 support person aged 18 or older with them. For treatment visits, patients cannot have anyone with them due to current Covid guidelines and our immunocompromised population.  

## 2020-11-03 NOTE — Progress Notes (Signed)
Patient presents today for Prolia injection.  Vital signs WNL.  Labs reviewed and Calcium is 9.1.  Patient is taking Calcium/Vit D supplements and has had no jaw pain or major dental work.  Patient has had some teeth to chip and is scheduled in August to have one pulled.  Dr. Delton Coombes notified.    No injection today per Dr. Delton Coombes.  Patient to follow up with dentist before resuming Prolia injections.   No complaints at this time.  Discharge from clinic ambulatory in stable condition.  Alert and oriented X 3.  Follow up with [facility] as scheduled.

## 2020-11-22 DIAGNOSIS — Z452 Encounter for adjustment and management of vascular access device: Secondary | ICD-10-CM | POA: Diagnosis not present

## 2020-12-07 DIAGNOSIS — F1120 Opioid dependence, uncomplicated: Secondary | ICD-10-CM | POA: Diagnosis not present

## 2020-12-07 DIAGNOSIS — M898X9 Other specified disorders of bone, unspecified site: Secondary | ICD-10-CM | POA: Diagnosis not present

## 2020-12-07 DIAGNOSIS — G620 Drug-induced polyneuropathy: Secondary | ICD-10-CM | POA: Diagnosis not present

## 2020-12-13 DIAGNOSIS — L723 Sebaceous cyst: Secondary | ICD-10-CM | POA: Diagnosis not present

## 2020-12-13 DIAGNOSIS — Z6841 Body Mass Index (BMI) 40.0 and over, adult: Secondary | ICD-10-CM | POA: Diagnosis not present

## 2021-02-21 DIAGNOSIS — Z6841 Body Mass Index (BMI) 40.0 and over, adult: Secondary | ICD-10-CM | POA: Diagnosis not present

## 2021-02-21 DIAGNOSIS — G620 Drug-induced polyneuropathy: Secondary | ICD-10-CM | POA: Diagnosis not present

## 2021-02-21 DIAGNOSIS — M898X9 Other specified disorders of bone, unspecified site: Secondary | ICD-10-CM | POA: Diagnosis not present

## 2021-02-21 DIAGNOSIS — F1120 Opioid dependence, uncomplicated: Secondary | ICD-10-CM | POA: Diagnosis not present

## 2021-03-05 ENCOUNTER — Inpatient Hospital Stay (HOSPITAL_COMMUNITY)
Admission: EM | Admit: 2021-03-05 | Discharge: 2021-03-07 | DRG: 872 | Disposition: A | Discharge status: Home / Self Care | Payer: PPO | Attending: Family Medicine | Admitting: Family Medicine

## 2021-03-05 ENCOUNTER — Other Ambulatory Visit: Payer: Self-pay

## 2021-03-05 ENCOUNTER — Inpatient Hospital Stay (HOSPITAL_COMMUNITY): Payer: PPO

## 2021-03-05 ENCOUNTER — Encounter (HOSPITAL_COMMUNITY): Payer: Self-pay | Admitting: Internal Medicine

## 2021-03-05 ENCOUNTER — Emergency Department (HOSPITAL_COMMUNITY): Payer: PPO

## 2021-03-05 DIAGNOSIS — Z853 Personal history of malignant neoplasm of breast: Secondary | ICD-10-CM

## 2021-03-05 DIAGNOSIS — R0602 Shortness of breath: Secondary | ICD-10-CM | POA: Diagnosis not present

## 2021-03-05 DIAGNOSIS — Z8042 Family history of malignant neoplasm of prostate: Secondary | ICD-10-CM | POA: Diagnosis not present

## 2021-03-05 DIAGNOSIS — Z9013 Acquired absence of bilateral breasts and nipples: Secondary | ICD-10-CM | POA: Diagnosis not present

## 2021-03-05 DIAGNOSIS — C50919 Malignant neoplasm of unspecified site of unspecified female breast: Secondary | ICD-10-CM | POA: Diagnosis not present

## 2021-03-05 DIAGNOSIS — G2581 Restless legs syndrome: Secondary | ICD-10-CM | POA: Diagnosis present

## 2021-03-05 DIAGNOSIS — G629 Polyneuropathy, unspecified: Secondary | ICD-10-CM | POA: Diagnosis present

## 2021-03-05 DIAGNOSIS — Z79811 Long term (current) use of aromatase inhibitors: Secondary | ICD-10-CM

## 2021-03-05 DIAGNOSIS — A419 Sepsis, unspecified organism: Principal | ICD-10-CM

## 2021-03-05 DIAGNOSIS — R7989 Other specified abnormal findings of blood chemistry: Secondary | ICD-10-CM

## 2021-03-05 DIAGNOSIS — D649 Anemia, unspecified: Secondary | ICD-10-CM | POA: Diagnosis not present

## 2021-03-05 DIAGNOSIS — E872 Acidosis, unspecified: Secondary | ICD-10-CM

## 2021-03-05 DIAGNOSIS — E876 Hypokalemia: Secondary | ICD-10-CM | POA: Diagnosis not present

## 2021-03-05 DIAGNOSIS — R000 Tachycardia, unspecified: Secondary | ICD-10-CM | POA: Diagnosis not present

## 2021-03-05 DIAGNOSIS — Z79899 Other long term (current) drug therapy: Secondary | ICD-10-CM

## 2021-03-05 DIAGNOSIS — Z6841 Body Mass Index (BMI) 40.0 and over, adult: Secondary | ICD-10-CM

## 2021-03-05 DIAGNOSIS — I972 Postmastectomy lymphedema syndrome: Secondary | ICD-10-CM | POA: Diagnosis present

## 2021-03-05 DIAGNOSIS — Z90710 Acquired absence of both cervix and uterus: Secondary | ICD-10-CM

## 2021-03-05 DIAGNOSIS — E66813 Obesity, class 3: Secondary | ICD-10-CM | POA: Diagnosis present

## 2021-03-05 DIAGNOSIS — Z8041 Family history of malignant neoplasm of ovary: Secondary | ICD-10-CM

## 2021-03-05 DIAGNOSIS — C50012 Malignant neoplasm of nipple and areola, left female breast: Secondary | ICD-10-CM | POA: Diagnosis not present

## 2021-03-05 DIAGNOSIS — I517 Cardiomegaly: Secondary | ICD-10-CM | POA: Diagnosis not present

## 2021-03-05 DIAGNOSIS — L03113 Cellulitis of right upper limb: Secondary | ICD-10-CM

## 2021-03-05 DIAGNOSIS — Z20822 Contact with and (suspected) exposure to covid-19: Secondary | ICD-10-CM | POA: Diagnosis present

## 2021-03-05 DIAGNOSIS — D696 Thrombocytopenia, unspecified: Secondary | ICD-10-CM

## 2021-03-05 DIAGNOSIS — Z803 Family history of malignant neoplasm of breast: Secondary | ICD-10-CM

## 2021-03-05 DIAGNOSIS — E871 Hypo-osmolality and hyponatremia: Secondary | ICD-10-CM | POA: Diagnosis not present

## 2021-03-05 DIAGNOSIS — C50911 Malignant neoplasm of unspecified site of right female breast: Secondary | ICD-10-CM

## 2021-03-05 DIAGNOSIS — Z8249 Family history of ischemic heart disease and other diseases of the circulatory system: Secondary | ICD-10-CM

## 2021-03-05 DIAGNOSIS — R509 Fever, unspecified: Secondary | ICD-10-CM | POA: Diagnosis not present

## 2021-03-05 DIAGNOSIS — E669 Obesity, unspecified: Secondary | ICD-10-CM | POA: Insufficient documentation

## 2021-03-05 DIAGNOSIS — Z888 Allergy status to other drugs, medicaments and biological substances status: Secondary | ICD-10-CM | POA: Diagnosis not present

## 2021-03-05 DIAGNOSIS — Z91040 Latex allergy status: Secondary | ICD-10-CM

## 2021-03-05 DIAGNOSIS — C50011 Malignant neoplasm of nipple and areola, right female breast: Secondary | ICD-10-CM | POA: Diagnosis not present

## 2021-03-05 DIAGNOSIS — I2699 Other pulmonary embolism without acute cor pulmonale: Secondary | ICD-10-CM | POA: Diagnosis not present

## 2021-03-05 LAB — CBC WITH DIFFERENTIAL/PLATELET
Abs Immature Granulocytes: 0.02 10*3/uL (ref 0.00–0.07)
Basophils Absolute: 0 10*3/uL (ref 0.0–0.1)
Basophils Relative: 0 %
Eosinophils Absolute: 0 10*3/uL (ref 0.0–0.5)
Eosinophils Relative: 0 %
HCT: 33.6 % — ABNORMAL LOW (ref 36.0–46.0)
Hemoglobin: 11.3 g/dL — ABNORMAL LOW (ref 12.0–15.0)
Immature Granulocytes: 0 %
Lymphocytes Relative: 12 %
Lymphs Abs: 1.1 10*3/uL (ref 0.7–4.0)
MCH: 29.5 pg (ref 26.0–34.0)
MCHC: 33.6 g/dL (ref 30.0–36.0)
MCV: 87.7 fL (ref 80.0–100.0)
Monocytes Absolute: 0.4 10*3/uL (ref 0.1–1.0)
Monocytes Relative: 5 %
Neutro Abs: 7.5 10*3/uL (ref 1.7–7.7)
Neutrophils Relative %: 83 %
Platelets: 134 10*3/uL — ABNORMAL LOW (ref 150–400)
RBC: 3.83 MIL/uL — ABNORMAL LOW (ref 3.87–5.11)
RDW: 13.2 % (ref 11.5–15.5)
WBC: 9.1 10*3/uL (ref 4.0–10.5)
nRBC: 0 % (ref 0.0–0.2)

## 2021-03-05 LAB — RESP PANEL BY RT-PCR (FLU A&B, COVID) ARPGX2
Influenza A by PCR: NEGATIVE
Influenza B by PCR: NEGATIVE
SARS Coronavirus 2 by RT PCR: NEGATIVE

## 2021-03-05 LAB — COMPREHENSIVE METABOLIC PANEL
ALT: 14 U/L (ref 0–44)
AST: 19 U/L (ref 15–41)
Albumin: 3.9 g/dL (ref 3.5–5.0)
Alkaline Phosphatase: 76 U/L (ref 38–126)
Anion gap: 6 (ref 5–15)
BUN: 12 mg/dL (ref 6–20)
CO2: 23 mmol/L (ref 22–32)
Calcium: 8.6 mg/dL — ABNORMAL LOW (ref 8.9–10.3)
Chloride: 101 mmol/L (ref 98–111)
Creatinine, Ser: 0.75 mg/dL (ref 0.44–1.00)
GFR, Estimated: >60 mL/min (ref >60)
Glucose, Bld: 128 mg/dL — ABNORMAL HIGH (ref 70–99)
Potassium: 3.3 mmol/L — ABNORMAL LOW (ref 3.5–5.1)
Sodium: 130 mmol/L — ABNORMAL LOW (ref 135–145)
Total Bilirubin: 0.9 mg/dL (ref 0.3–1.2)
Total Protein: 6.9 g/dL (ref 6.5–8.1)

## 2021-03-05 LAB — D-DIMER, QUANTITATIVE: D-Dimer, Quant: 0.51 ug/mL-FEU — ABNORMAL HIGH (ref 0.00–0.50)

## 2021-03-05 LAB — URINALYSIS, ROUTINE W REFLEX MICROSCOPIC
Bilirubin Urine: NEGATIVE
Glucose, UA: NEGATIVE mg/dL
Hgb urine dipstick: NEGATIVE
Ketones, ur: NEGATIVE mg/dL
Leukocytes,Ua: NEGATIVE
Nitrite: NEGATIVE
Protein, ur: NEGATIVE mg/dL
Specific Gravity, Urine: 1.015 (ref 1.005–1.030)
pH: 8.5 — ABNORMAL HIGH (ref 5.0–8.0)

## 2021-03-05 LAB — HIV ANTIBODY (ROUTINE TESTING W REFLEX): HIV Screen 4th Generation wRfx: NONREACTIVE

## 2021-03-05 LAB — MAGNESIUM: Magnesium: 1.3 mg/dL — ABNORMAL LOW (ref 1.7–2.4)

## 2021-03-05 LAB — LACTIC ACID, PLASMA
Lactic Acid, Venous: 2.4 mmol/L (ref 0.5–1.9)
Lactic Acid, Venous: 2.6 mmol/L (ref 0.5–1.9)
Lactic Acid, Venous: 1.4 mmol/L (ref 0.5–1.9)

## 2021-03-05 LAB — POC URINE PREG, ED: Preg Test, Ur: NEGATIVE

## 2021-03-05 LAB — APTT: aPTT: 24 seconds (ref 24–36)

## 2021-03-05 LAB — PHOSPHORUS: Phosphorus: 1.9 mg/dL — ABNORMAL LOW (ref 2.5–4.6)

## 2021-03-05 LAB — PROTIME-INR
INR: 1 (ref 0.8–1.2)
Prothrombin Time: 13.6 seconds (ref 11.4–15.2)

## 2021-03-05 MED ORDER — DULOXETINE HCL 30 MG PO CPEP
30.0000 mg | ORAL_CAPSULE | Freq: Every day | ORAL | Status: DC
  Filled 2021-03-05: qty 1

## 2021-03-05 MED ORDER — LACTATED RINGERS IV BOLUS (SEPSIS)
1000.0000 mL | Freq: Once | INTRAVENOUS | Status: AC
  Administered 2021-03-05: 1000 mL via INTRAVENOUS

## 2021-03-05 MED ORDER — GABAPENTIN 300 MG PO CAPS
900.0000 mg | ORAL_CAPSULE | Freq: Two times a day (BID) | ORAL | Status: DC
  Administered 2021-03-05 – 2021-03-07 (×4): 900 mg via ORAL
  Filled 2021-03-05 (×5): qty 3

## 2021-03-05 MED ORDER — ANASTROZOLE 1 MG PO TABS
1.0000 mg | ORAL_TABLET | Freq: Every day | ORAL | Status: DC
  Administered 2021-03-05 – 2021-03-07 (×3): 1 mg via ORAL
  Filled 2021-03-05 (×4): qty 1

## 2021-03-05 MED ORDER — ACETAMINOPHEN 325 MG PO TABS
650.0000 mg | ORAL_TABLET | Freq: Four times a day (QID) | ORAL | Status: DC | PRN
  Administered 2021-03-05: 650 mg via ORAL
  Filled 2021-03-05: qty 2

## 2021-03-05 MED ORDER — ACETAMINOPHEN 325 MG PO TABS
650.0000 mg | ORAL_TABLET | Freq: Once | ORAL | Status: AC
  Administered 2021-03-05: 650 mg via ORAL
  Filled 2021-03-05: qty 2

## 2021-03-05 MED ORDER — HYDROCODONE-ACETAMINOPHEN 10-325 MG PO TABS
1.0000 | ORAL_TABLET | ORAL | Status: DC | PRN
  Administered 2021-03-05 – 2021-03-07 (×5): 1 via ORAL
  Filled 2021-03-05 (×5): qty 1

## 2021-03-05 MED ORDER — DULOXETINE HCL 60 MG PO CPEP
60.0000 mg | ORAL_CAPSULE | Freq: Every day | ORAL | Status: DC
  Administered 2021-03-05: 60 mg via ORAL
  Filled 2021-03-05 (×2): qty 1

## 2021-03-05 MED ORDER — IOHEXOL 350 MG/ML SOLN
75.0000 mL | Freq: Once | INTRAVENOUS | Status: AC | PRN
  Administered 2021-03-05: 75 mL via INTRAVENOUS

## 2021-03-05 MED ORDER — OXYCODONE HCL 5 MG PO TABS
5.0000 mg | ORAL_TABLET | ORAL | Status: DC | PRN
  Administered 2021-03-05: 5 mg via ORAL
  Filled 2021-03-05: qty 1

## 2021-03-05 MED ORDER — LACTATED RINGERS IV BOLUS (SEPSIS)
3000.0000 mL | Freq: Once | INTRAVENOUS | Status: AC
  Administered 2021-03-05: 3000 mL via INTRAVENOUS

## 2021-03-05 MED ORDER — KETOROLAC TROMETHAMINE 30 MG/ML IJ SOLN
30.0000 mg | Freq: Once | INTRAMUSCULAR | Status: AC
  Administered 2021-03-05: 30 mg via INTRAVENOUS
  Filled 2021-03-05: qty 1

## 2021-03-05 MED ORDER — ENOXAPARIN SODIUM 60 MG/0.6ML IJ SOSY
60.0000 mg | PREFILLED_SYRINGE | INTRAMUSCULAR | Status: DC
  Administered 2021-03-05 – 2021-03-07 (×2): 60 mg via SUBCUTANEOUS
  Filled 2021-03-05 (×3): qty 0.6

## 2021-03-05 MED ORDER — POTASSIUM CHLORIDE CRYS ER 20 MEQ PO TBCR
40.0000 meq | EXTENDED_RELEASE_TABLET | Freq: Once | ORAL | Status: AC
  Administered 2021-03-05: 40 meq via ORAL
  Filled 2021-03-05: qty 2

## 2021-03-05 MED ORDER — LACTATED RINGERS IV SOLN: INTRAVENOUS | Status: AC

## 2021-03-05 MED ORDER — ALBUTEROL SULFATE HFA 108 (90 BASE) MCG/ACT IN AERS
2.0000 | INHALATION_SPRAY | RESPIRATORY_TRACT | Status: DC | PRN
  Administered 2021-03-05: 2 via RESPIRATORY_TRACT
  Filled 2021-03-05: qty 6.7

## 2021-03-05 MED ORDER — CYCLOBENZAPRINE HCL 10 MG PO TABS: 5.0000 mg | ORAL_TABLET | Freq: Three times a day (TID) | ORAL | Status: DC | PRN

## 2021-03-05 MED ORDER — ENOXAPARIN SODIUM 40 MG/0.4ML IJ SOSY: 40.0000 mg | PREFILLED_SYRINGE | INTRAMUSCULAR | Status: DC

## 2021-03-05 MED ORDER — CEFTRIAXONE SODIUM 2 G IJ SOLR
2.0000 g | INTRAMUSCULAR | Status: DC
Start: 2021-03-05 — End: 2021-03-07
  Administered 2021-03-05 – 2021-03-07 (×3): 2 g via INTRAVENOUS
  Filled 2021-03-05 (×4): qty 20

## 2021-03-05 MED ORDER — GABAPENTIN 300 MG PO CAPS
600.0000 mg | ORAL_CAPSULE | Freq: Every day | ORAL | Status: DC
  Administered 2021-03-05: 600 mg via ORAL
  Filled 2021-03-05: qty 2

## 2021-03-05 MED ORDER — ROPINIROLE HCL 1 MG PO TABS
2.0000 mg | ORAL_TABLET | Freq: Every day | ORAL | Status: DC
  Administered 2021-03-05 – 2021-03-06 (×2): 2 mg via ORAL
  Filled 2021-03-05 (×2): qty 2

## 2021-03-05 NOTE — H&P (Signed)
History and Physical  Carly [name] JJK:[id number] DOB: [birth year] DOA: 03/05/2021  Referring physician: Delora Fuel, MD PCP: Carly Burly, MD  Patient coming from: Home  Chief Complaint: Shortness of breath.  HPI: Carly [name] is a 40 y.o. female with medical history significant for breast cancer (follows with Dr. Delton [name]), chronic lymphedema of right upper extremity, peripheral neuropathy who presents to the emergency department due to fever and chills which started at home around 11:30 PM last night (9/10) with a temperature of 102.56F.  Patient noted redness in right upper arm and complaining of worsening pain, she also complained of spread of the rash to the forearm by the time she arrived at the ED.  She complained of tingling sensation in all extremities.  ED Course:  In the emergency department, he was febrile with a temperature of 102.67F, tachycardic and tachypneic.  O2 sat was 99-100% on room air.  Work-up in the ED showed normocytic anemia, thrombocytopenia, hypokalemia, hyponatremia, thrombocytopenia, lactic acid 2.4, D-dimer 0.51, influenza A, B and SARS coronavirus 2 was negative. Chest x-ray showed no evidence of acute cardiopulmonary disease She was treated with Tylenol and Toradol due to fever, IV hydration per sepsis protocol was provided, she was started on IV ceftriaxone, potassium was replenished.  Hospitalist was asked to admit patient for further evaluation and management.  Review of Systems: Constitutional: Negative for chills and fever.  HENT: Negative for ear pain and sore throat.   Eyes: Negative for pain and visual disturbance.  Respiratory: Positive for shortness of breath.  Negative for cough and chest tightness  Cardiovascular: Negative for chest pain and palpitations.  Gastrointestinal: Negative for abdominal pain and vomiting.  Endocrine: Negative for polyphagia and polyuria.  Genitourinary: Negative for decreased urine volume, dysuria,  enuresis Musculoskeletal: Negative for arthralgias and back pain.  Skin: Negative for color change and rash.  Allergic/Immunologic: Negative for immunocompromised state.  Neurological: Negative for tremors, syncope, speech difficulty, weakness, light-headedness and headaches.  Hematological: Does not bruise/bleed easily.  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 She has never used smokeless tobacco. She reports that she does not drink alcohol and does not use drugs.   Allergies  Allergen Reactions   Latex Rash   Other Rash    Oozing blistery rash   Sumatriptan Other (See Comments)    Other reaction(s): Unknown Numbness of face and arms   Tape Rash    Oozing blistery rash    Family History  Problem Relation Age of Onset   Breast cancer Mother 76   BRCA 1/2 Mother    Prostate cancer Father        metastatic   Breast cancer Sister 94       BRCA1 mutation   Breast cancer Maternal Aunt        dx in her 59s; BRCA1 mutation   Heart attack Maternal Grandfather    Ovarian cancer Paternal Grandmother        dx in her 31s   Heart attack Paternal Grandfather    Healthy Sister    BRCA 1/2 Maternal Aunt    Healthy Maternal Aunt        negative for  a BRCA mutation   Healthy Maternal Aunt        negative for a BRCA mutation     Prior to Admission medications   Medication Sig Start Date End Date Taking? Authorizing Provider  ALPRAZolam Carly [name]) 0.5 MG tablet TAKE ONE TABLET BY MOUTH AT BEDTIME AS NEEDED FOR ANXIETY 10/31/20   Carly Jack, MD  amitriptyline (ELAVIL) 25 MG tablet Take 25 mg by mouth. Pt takes 1-3 tablets at bedtime 02/27/17   [provider]  anastrozole (ARIMIDEX) 1 MG tablet TAKE ONE TABLET  BY MOUTH DAILY 09/21/20   Carly Jack, MD  Calcium-Magnesium-Vitamin D (CALCIUM 500 PO) Take by mouth daily.    [provider]  cholecalciferol (VITAMIN D) 25 MCG (1000 UNIT) tablet Take 1,000 Units by mouth daily. 06/23/19   [provider]  cyclobenzaprine (FLEXERIL) 5 MG tablet Take 5 mg by mouth 3 (three) times daily as needed.  07/13/15   [provider]  docusate sodium (COLACE) 100 MG capsule Take by mouth. 07/05/15   [provider]  DULoxetine (CYMBALTA) 30 MG capsule Take 30 mg by mouth daily.  02/27/17   [provider]  DULoxetine (CYMBALTA) 60 MG capsule Take 1 capsule (60 mg total) by mouth daily. 06/14/16   Carly Bouche, NP  gabapentin (NEURONTIN) 600 MG tablet Take 900 mg 2 (two) times daily by mouth. Take 900 mg twice daily, take 600 mg at noon 02/23/17   [provider]  HYDROcodone-acetaminophen (NORCO) 10-325 MG tablet Take 1 tablet by mouth every 4 (four) hours as needed. 11/16/16   Carly Bouche, NP  methocarbamol (ROBAXIN) 500 MG tablet Take 500 mg by mouth 3 (three) times daily. 06/22/19   [provider]  Misc. Devices MISC Please provide patient with a compression therapy pump fot her Right arm lymphedema. 04/26/17   Carly First, MD  Multiple Vitamins-Minerals (MULTIVITAMIN ADULT PO) Take 1 tablet daily by mouth.    [provider]  ondansetron (ZOFRAN) 4 MG tablet TAKE ONE TABLET BY MOUTH EVERY 6 HOURS AS NEEDED FOR NAUSEA 05/18/15   [provider]  phentermine (ADIPEX-P) 37.5 MG tablet Take 37.5 mg by mouth daily. 11/30/19   [provider]  prochlorperazine (COMPAZINE) 10 MG tablet Take 10 mg by mouth as needed.  Patient not taking: Reported on 04/11/2020    [provider]  promethazine (PHENERGAN) 12.5 MG tablet Take by mouth. Patient not taking: Reported on 04/11/2020    [provider]  promethazine (PHENERGAN) 25 MG tablet Take 25 mg by mouth  every 6 (six) hours as needed. Patient not taking: Reported on 04/11/2020 06/23/19   [provider]  rOPINIRole (REQUIP) 2 MG tablet Take 2 mg by mouth at bedtime. 03/01/20   [provider]  sodium fluoride (FLUORISHIELD) 1.1 % GEL dental gel SF 5000 Plus 1.1 % dental cream    [provider]  valACYclovir (VALTREX) 1000 MG tablet Take 2 tablets by mouth as needed.  Patient not taking: Reported on 04/11/2020 02/28/17   [provider]  Zinc Chelated 50 MG TABS Take 1 tablet by mouth daily. 06/23/19   [provider]    Physical Exam: BP 125/76   Pulse (!) 117   Temp (!) 102.7 F (39.3 C) (Oral)   Resp (!) 26   Ht _0  (1.702 m)   Wt 124.7 kg   SpO2 100%   BMI 43.07 kg/m   General: 40 y.o. year-old female well developed well  nourished in no acute distress.  Alert and oriented x3. HEENT: NCAT, EOMI Neck: Supple, trachea medial Cardiovascular: Regular rate and rhythm with no rubs or gallops.  No thyromegaly or JVD noted.  No lower extremity edema. 2/4 pulses in all 4 extremities. Respiratory: Clear to auscultation with no wheezes or rales. Good inspiratory effort. Abdomen: Soft, nontender nondistended with normal bowel sounds x4 quadrants. Muskuloskeletal: RUE tender to touch with erythema and warmth compared to LUE.  Port-A-Cath site on chest noted.  No cyanosis, or clubbing noted bilaterally Neuro: CN II-XII intact, strength 5/5 x 4, sensation, reflexes intact Skin: No ulcerative lesions noted or rashes Psychiatry: Mood is appropriate for condition and setting          Labs on Admission:  Basic Metabolic Panel: Recent Labs  Lab 03/05/21 0224  NA 130*  K 3.3*  CL 101  CO2 23  GLUCOSE 128*  BUN 12  CREATININE 0.75  CALCIUM 8.6*   Liver Function Tests: Recent Labs  Lab 03/05/21 0224  AST 19  ALT 14  ALKPHOS 76  BILITOT 0.9  PROT 6.9  ALBUMIN 3.9   No results for input(s): LIPASE, AMYLASE in the last 168 hours. No  results for input(s): AMMONIA in the last 168 hours. CBC: Recent Labs  Lab 03/05/21 0224  WBC 9.1  NEUTROABS 7.5  HGB 11.3*  HCT 33.6*  MCV 87.7  PLT 134*   Cardiac Enzymes: No results for input(s): CKTOTAL, CKMB, CKMBINDEX, TROPONINI in the last 168 hours.  BNP (last 3 results) No results for input(s): BNP in the last 8760 hours.  ProBNP (last 3 results) No results for input(s): PROBNP in the last 8760 hours.  CBG: No results for input(s): GLUCAP in the last 168 hours.  Radiological Exams on Admission: DG Chest Port 1 View  Result Date: 03/05/2021 CLINICAL DATA:  Shortness of breath, code sepsis EXAM: PORTABLE CHEST 1 VIEW COMPARISON:  None. FINDINGS: Lungs are clear.  No pleural effusion or pneumothorax. The heart is normal in size. Left chest power port terminates at the cavoatrial junction. Surgical clips along the right chest wall/axilla. IMPRESSION: No evidence of acute cardiopulmonary disease. Electronically Signed   By: Julian Hy M.D.   On: 03/05/2021 02:38    EKG: I independently viewed the EKG done and my findings are as followed: Sinus tachycardia at a rate of 11 8 bpm  Assessment/Plan Present on Admission:  Cellulitis of right upper arm  Obesity, Class III, BMI 40-49.9 (morbid obesity) (HCC)  Active Problems:   Obesity, Class III, BMI 40-49.9 (morbid obesity) (HCC)   Breast cancer (HCC)   Cellulitis of right upper arm   Hyponatremia   Hypokalemia   Thrombocytopenia (HCC)   Lactic acidosis   Elevated d-dimer   Sepsis ([place])  Sepsis secondary to right upper arm cellulitis Patient met sepsis criteria due to being tachypneic, tachycardic, febrile, with source of infection being right upper extremity.  Lactic acidosis was also positive. IV hydration per sepsis protocol was provided and patient was started started on IV ceftriaxone, we shall continue with same at this time Continue Tylenol as needed for fever Blood culture pending  Lactic acidosis  secondary to above Lactic acid 2.4, continue to trend lactic acid  Hyponatremia Na 130, continue IV hydration  Hypokalemia K+ is 3.3 K+ will be replenished Please monitor for AM K+ for further replenishmemnt  Thrombocytopenia possibly reactive Platelets 134, continue to monitor platelet level  Elevated D-dimer D-dimer 0.51, CT angiography chest to rule out  pulmonary embolism pending  Peripheral neuropathy Continue duloxetine, gabapentin, Flexeril  Restless leg syndrome Continue Requip  Obesity class III (BMI 43.07) Patient will be counseled on diet and lifestyle modification when more stable  History of right breast cancer Patient [facility] with [facility]  DVT prophylaxis: Lovenox  Code Status: Full code  Family Communication: None at bedside  Disposition Plan:  Patient is from:                        home Anticipated DC to:                   SNF or family members home Anticipated DC date:               2-3 days Anticipated DC barriers:          Patient requires inpatient management due to sepsis secondary to right upper extremity cellulitis    Consults called: None  Admission status: Inpatient    Bernadette Hoit MD [facility]  03/05/2021, 4:35 AM

## 2021-03-05 NOTE — Progress Notes (Signed)
   03/05/21 0951  Assess: MEWS Score  Temp 98.2 F (36.8 C)  BP (!) 97/57  Pulse Rate (!) 102  Resp 18  SpO2 97 %  O2 Device Room Air  Assess: MEWS Score  MEWS Temp 0  MEWS Systolic 1  MEWS Pulse 1  MEWS RR 0  MEWS LOC 0  MEWS Score 2  MEWS Score Color Yellow  Assess: if the MEWS score is Yellow or Red  Were vital signs taken at a resting state? Yes  Focused Assessment No change from prior assessment  Early Detection of Sepsis Score *See Row Information* Low  MEWS guidelines implemented *See Row Information* Yes  Treat  Pain Scale 0-10  Pain Score 6  Pain Type Acute pain  Pain Location Arm  Pain Orientation Right  Pain Intervention(s) Medication (See eMAR)  Take Vital Signs  Increase Vital Sign Frequency  Yellow: Q 2hr X 2 then Q 4hr X 2, if remains yellow, continue Q 4hrs  Escalate  MEWS: Escalate Yellow: discuss with charge nurse/RN and consider discussing with provider and RRT  Notify: Charge Nurse/RN  Name of Charge Nurse/RN Notified Rahael RN  Date Charge Nurse/RN Notified 03/05/21  Time Charge Nurse/RN Notified 1001  Notify: Provider  Provider Name/Title MD [facility]  Date Provider Notified 03/05/21  Time Provider Notified [phone]  Notification Type  (Secure chat)  Notification Reason Change in status (Yellow MEWS)

## 2021-03-05 NOTE — ED Notes (Signed)
Hospitalist at bedside 

## 2021-03-05 NOTE — ED Notes (Signed)
Per patient had small leak on right arm arm has lymphedema ant noted  redness to area

## 2021-03-05 NOTE — Progress Notes (Addendum)
Patient seen and evaluated, chart reviewed, please see EMR for updated orders. Please see full H&P dictated by admitting physician Dr Josephine Cables for same date of service.    Brief Summary:- 40 y.o. female with medical history significant for breast cancer (follows with Dr. Delton Coombes), chronic lymphedema of right upper extremity, peripheral neuropathy admitted on 03/05/2021 with sepsis secondary to right upper extremity cellulitis superimposed on chronic right upper extremity lymphedema in the setting of h/o breast cancer  A/p 1) sepsis secondary to right upper extremity cellulitis---POA -Patient met sepsis criteria on admission with fevers, tachycardia and tachypnea -Elevated lactic acid noted  please see brief summary above -Continue IV Rocephin pending culture data and continue IV fluids  2)RLS-continue Requip  3) peripheral neuropathy--- continue Cymbalta and gabapentin  4)Morbid Obesity- -Low calorie diet, portion control and increase physical activity discussed with patient -Body mass index is 43.9 kg/m.  5)H/o Breast Ca and chronic right upper extremity lymphedema----patient follows with Dr. Delton Coombes  -Total care time is 42 minutes  Patient seen and evaluated, chart reviewed, please see EMR for updated orders. Please see full H&P dictated by admitting physician Dr Josephine Cables for same date of service.

## 2021-03-05 NOTE — ED Notes (Signed)
Date and time results received: 03/05/21 0254   Test: LACTIC Critical Value: 2.4  Name of Provider Notified: Roxanne Mins, MD

## 2021-03-05 NOTE — Sepsis Progress Note (Signed)
Notified provider of need to order repeat lactic acid since 2nd lactic acid was higher than the 1st..

## 2021-03-05 NOTE — Sepsis Progress Note (Signed)
Following for code sepsis 

## 2021-03-05 NOTE — Sepsis Progress Note (Signed)
Difficult stick and access.

## 2021-03-05 NOTE — ED Triage Notes (Signed)
Patient complains of shortness of breath that started tonight around 1130 tonight. Pt family has been dx with covid. Patient states that at home her covid test have been negative. Temp 102.7.

## 2021-03-05 NOTE — ED Provider Notes (Signed)
[facility] EMERGENCY DEPARTMENT Provider Note   CSN: [id number] Arrival date & time: 03/05/21  0140     History Chief Complaint  Patient presents with   [name] is a 40 y.o. female.  The history is provided by the patient.  Shortness of Breath She has history of breast cancer, chronic lymphedema of the right arm and comes in because of fever and chills.  Fever and chills started tonight with temperature 102.4.  She noted redness in her right upper arm which is now more painful than it usually is.  She has been having some weeping from that arm for the last month.  She denies cough, nausea, vomiting, diarrhea, dysuria.  She is having some tingling in both arms and is feeling lightheaded and feels slightly dyspneic.  She had COVID-19 exposure about 5 weeks ago, but none recently.  No recent sick contacts.   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     Family History  Problem Relation  Age of Onset   Breast cancer Mother 28   BRCA 1/2 Mother    Prostate cancer Father        metastatic   Breast cancer Sister 42       BRCA1 mutation   Breast cancer Maternal Aunt        dx in her 47s; BRCA1 mutation   Heart attack Maternal Grandfather    Ovarian cancer Paternal Grandmother        dx in her 52s   Heart attack Paternal Grandfather    Healthy Sister    BRCA 1/2 Maternal Aunt    Healthy Maternal Aunt        negative for a BRCA mutation   Healthy Maternal Aunt        negative for a BRCA mutation    Social History   Tobacco Use   Smoking status: Never   Smokeless tobacco: Never  Substance Use Topics   Alcohol use: No   Drug use: No    Home Medications Prior to Admission medications   Medication Sig Start Date End Date Taking? Authorizing Provider  ALPRAZolam Duanne Moron) 0.5 MG tablet TAKE ONE TABLET BY MOUTH AT BEDTIME AS NEEDED FOR ANXIETY 10/31/20   Derek Jack, MD  amitriptyline (ELAVIL) 25 MG tablet Take 25 mg by mouth. Pt takes 1-3 tablets at bedtime 02/27/17   [provider]  anastrozole (ARIMIDEX) 1 MG tablet TAKE ONE TABLET BY MOUTH DAILY 09/21/20  Derek Jack, MD  Calcium-Magnesium-Vitamin D (CALCIUM 500 PO) Take by mouth daily.    [provider]  cholecalciferol (VITAMIN D) 25 MCG (1000 UNIT) tablet Take 1,000 Units by mouth daily. 06/23/19   [provider]  cyclobenzaprine (FLEXERIL) 5 MG tablet Take 5 mg by mouth 3 (three) times daily as needed.  07/13/15   [provider]  docusate sodium (COLACE) 100 MG capsule Take by mouth. 07/05/15   [provider]  DULoxetine (CYMBALTA) 30 MG capsule Take 30 mg by mouth daily.  02/27/17   [provider]  DULoxetine (CYMBALTA) 60 MG capsule Take 1 capsule (60 mg total) by mouth daily. 06/14/16   Holley Bouche, NP  gabapentin (NEURONTIN) 600 MG tablet Take 900 mg 2 (two) times daily by mouth. Take 900 mg twice daily, take 600 mg at noon 02/23/17    [provider]  HYDROcodone-acetaminophen (NORCO) 10-325 MG tablet Take 1 tablet by mouth every 4 (four) hours as needed. 11/16/16   Holley Bouche, NP  methocarbamol (ROBAXIN) 500 MG tablet Take 500 mg by mouth 3 (three) times daily. 06/22/19   [provider]  Misc. Devices MISC Please provide patient with a compression therapy pump fot her Right arm lymphedema. 04/26/17   Twana First, MD  Multiple Vitamins-Minerals (MULTIVITAMIN ADULT PO) Take 1 tablet daily by mouth.    [provider]  ondansetron (ZOFRAN) 4 MG tablet TAKE ONE TABLET BY MOUTH EVERY 6 HOURS AS NEEDED FOR NAUSEA 05/18/15   [provider]  phentermine (ADIPEX-P) 37.5 MG tablet Take 37.5 mg by mouth daily. 11/30/19   [provider]  prochlorperazine (COMPAZINE) 10 MG tablet Take 10 mg by mouth as needed.  Patient not taking: Reported on 04/11/2020    [provider]  promethazine (PHENERGAN) 12.5 MG tablet Take by mouth. Patient not taking: Reported on 04/11/2020    [provider]  promethazine (PHENERGAN) 25 MG tablet Take 25 mg by mouth every 6 (six) hours as needed. Patient not taking: Reported on 04/11/2020 06/23/19   [provider]  rOPINIRole (REQUIP) 2 MG tablet Take 2 mg by mouth at bedtime. 03/01/20   [provider]  sodium fluoride (FLUORISHIELD) 1.1 % GEL dental gel SF 5000 Plus 1.1 % dental cream    [provider]  valACYclovir (VALTREX) 1000 MG tablet Take 2 tablets by mouth as needed.  Patient not taking: Reported on 04/11/2020 02/28/17   [provider]  Zinc Chelated 50 MG TABS Take 1 tablet by mouth daily. 06/23/19   [provider]    Allergies    Latex, Other, Sumatriptan, and Tape  Review of Systems   Review of Systems  Respiratory:  Positive for shortness of breath.   All other systems reviewed and are negative.  Physical Exam Updated Vital Signs BP (!) 99/57 (BP Location: Left Arm)    Pulse (!) 125   Temp 100.1 F (37.8 C) (Oral)   Resp 20   Ht '5\' 7"'  (1.702 m)   Wt 127.1 kg   SpO2 97%   BMI 43.90 kg/m   Physical Exam Vitals and nursing note reviewed.  40 year old female, resting comfortably and in no acute distress. Vital signs are significant for fever and rapid heart rate. Oxygen saturation is 97%, which is normal. Head is normocephalic and atraumatic. PERRLA, EOMI. Oropharynx is clear. Neck is nontender and supple without adenopathy or JVD. Back is nontender and there is no CVA tenderness. Lungs are  clear without rales, wheezes, or rhonchi. Chest is nontender.  Mediport present on the left. Heart has regular rate and rhythm without murmur. Abdomen is soft, flat, nontender without masses or hepatosplenomegaly and peristalsis is normoactive. Extremities: Lymphedema present right upper arm with erythema and some peau d'orange changes.  No drainage is noted.  Remainder of extremity exam is unremarkable. Skin is warm and dry without rash. Neurologic: Mental status is normal, cranial nerves are intact, there are no motor or sensory deficits.  ED Results / Procedures / Treatments   Labs (all labs ordered are listed, but only abnormal results are displayed) Labs Reviewed  LACTIC ACID, PLASMA - Abnormal; Notable for the following components:      Result Value   Lactic Acid, Venous 2.4 (*)    All other components within normal limits  COMPREHENSIVE METABOLIC PANEL - Abnormal; Notable for the following components:   Sodium 130 (*)    Potassium 3.3 (*)    Glucose, Bld 128 (*)    Calcium 8.6 (*)    All other components within normal limits  CBC WITH DIFFERENTIAL/PLATELET - Abnormal; Notable for the following components:   RBC 3.83 (*)    Hemoglobin 11.3 (*)    HCT 33.6 (*)    Platelets 134 (*)    All other components within normal limits  D-DIMER, QUANTITATIVE - Abnormal; Notable for the following components:   D-Dimer, Quant 0.51 (*)    All other components  within normal limits  RESP PANEL BY RT-PCR (FLU A&B, COVID) ARPGX2  CULTURE, BLOOD (ROUTINE X 2)  CULTURE, BLOOD (ROUTINE X 2)  URINE CULTURE  PROTIME-INR  APTT  LACTIC ACID, PLASMA  URINALYSIS, ROUTINE W REFLEX MICROSCOPIC  POC URINE PREG, ED    EKG EKG Interpretation  Date/Time:  Sunday March 05 2021 02:03:00 EDT Ventricular Rate:  108 PR Interval:  138 QRS Duration: 96 QT Interval:  323 QTC Calculation: 433 R Axis:   64 Text Interpretation: Sinus tachycardia Otherwise within normal limits No old tracing to compare Confirmed by Delora Fuel ([id number]) on 03/05/2021 2:11:07 AM  Radiology DG Chest Port 1 View  Result Date: 03/05/2021 CLINICAL DATA:  Shortness of breath, code sepsis EXAM: PORTABLE CHEST 1 VIEW COMPARISON:  None. FINDINGS: Lungs are clear.  No pleural effusion or pneumothorax. The heart is normal in size. Left chest power port terminates at the cavoatrial junction. Surgical clips along the right chest wall/axilla. IMPRESSION: No evidence of acute cardiopulmonary disease. Electronically Signed   By: Julian Hy M.D.   On: 03/05/2021 02:38    Procedures Procedures  CRITICAL CARE Performed by: Delora Fuel Total critical care time: 60 minutes Critical care time was exclusive of separately billable procedures and treating other patients. Critical care was necessary to treat or prevent imminent or life-threatening deterioration. Critical care was time spent personally by me on the following activities: development of treatment plan with patient and/or surrogate as well as nursing, discussions with consultants, evaluation of patient's response to treatment, examination of patient, obtaining history from patient or surrogate, ordering and performing treatments and interventions, ordering and review of laboratory studies, ordering and review of radiographic studies, pulse oximetry and re-evaluation of patient's condition.  Medications Ordered in ED Medications   albuterol (VENTOLIN HFA) 108 (90 Base) MCG/ACT inhaler 2 puff (has no administration in time range)  lactated ringers infusion (has no administration in time range)  lactated ringers bolus 1,000 mL (has no administration in time range)  cefTRIAXone (ROCEPHIN) 2 g in sodium  chloride 0.9 % 100 mL IVPB (has no administration in time range)  acetaminophen (TYLENOL) tablet 650 mg (has no administration in time range)  ketorolac (TORADOL) 30 MG/ML injection 30 mg (has no administration in time range)    ED Course  I have reviewed the triage vital signs and the nursing notes.  Pertinent labs & imaging results that were available during my care of the patient were reviewed by me and considered in my medical decision making (see chart for details).   MDM Rules/Calculators/A&P                         Fever with positive SIRS criteria.  Source appears to be cellulitis of the right upper arm where she has had lymphedema.  She is started on sepsis pathway and started on antibiotics for cellulitis.  Old records were reviewed, confirming outpatient monitoring for history of breast cancer, chronic lymphedema of right arm.  ECG shows sinus tachycardia, otherwise normal.  Chest x-ray shows no evidence of pneumonia.  Labs do show mildly elevated lactic acid level and she is given early goal-directed fluids.  Mild anemia is present as well as mild hypokalemia.  She is given a dose of oral potassium.  Mild thrombocytopenia is present.  D-dimer is mildly elevated, CT angiogram of the chest is ordered.  Case is discussed with Dr. Josephine Cables of [facility], who agrees to admit the patient.  Final Clinical Impression(s) / ED Diagnoses Final diagnoses:  Cellulitis of right upper extremity  Sepsis due to undetermined organism ([place])  Hypokalemia  Elevated d-dimer  Normochromic normocytic anemia  Thrombocytopenia ([place])    Rx / DC Orders ED Discharge Orders     None        Delora Fuel, MD 35/46/56  [id number]

## 2021-03-06 DIAGNOSIS — C50012 Malignant neoplasm of nipple and areola, left female breast: Secondary | ICD-10-CM

## 2021-03-06 DIAGNOSIS — C50011 Malignant neoplasm of nipple and areola, right female breast: Secondary | ICD-10-CM

## 2021-03-06 LAB — PROTIME-INR
INR: 1.3 — ABNORMAL HIGH (ref 0.8–1.2)
Prothrombin Time: 15.7 seconds — ABNORMAL HIGH (ref 11.4–15.2)

## 2021-03-06 LAB — COMPREHENSIVE METABOLIC PANEL
ALT: 13 U/L (ref 0–44)
AST: 13 U/L — ABNORMAL LOW (ref 15–41)
Albumin: 3 g/dL — ABNORMAL LOW (ref 3.5–5.0)
Alkaline Phosphatase: 55 U/L (ref 38–126)
Anion gap: 7 (ref 5–15)
BUN: 7 mg/dL (ref 6–20)
CO2: 26 mmol/L (ref 22–32)
Calcium: 8.7 mg/dL — ABNORMAL LOW (ref 8.9–10.3)
Chloride: 104 mmol/L (ref 98–111)
Creatinine, Ser: 0.59 mg/dL (ref 0.44–1.00)
GFR, Estimated: >60 mL/min (ref >60)
Glucose, Bld: 113 mg/dL — ABNORMAL HIGH (ref 70–99)
Potassium: 3.7 mmol/L (ref 3.5–5.1)
Sodium: 137 mmol/L (ref 135–145)
Total Bilirubin: 0.6 mg/dL (ref 0.3–1.2)
Total Protein: 5.9 g/dL — ABNORMAL LOW (ref 6.5–8.1)

## 2021-03-06 LAB — CBC
HCT: 30.3 % — ABNORMAL LOW (ref 36.0–46.0)
Hemoglobin: 9.9 g/dL — ABNORMAL LOW (ref 12.0–15.0)
MCH: 29.7 pg (ref 26.0–34.0)
MCHC: 32.7 g/dL (ref 30.0–36.0)
MCV: 91 fL (ref 80.0–100.0)
Platelets: 119 10*3/uL — ABNORMAL LOW (ref 150–400)
RBC: 3.33 MIL/uL — ABNORMAL LOW (ref 3.87–5.11)
RDW: 13.5 % (ref 11.5–15.5)
WBC: 8.3 10*3/uL (ref 4.0–10.5)
nRBC: 0 % (ref 0.0–0.2)

## 2021-03-06 LAB — URINE CULTURE

## 2021-03-06 LAB — APTT: aPTT: 37 seconds — ABNORMAL HIGH (ref 24–36)

## 2021-03-06 MED ORDER — KETOROLAC TROMETHAMINE 30 MG/ML IJ SOLN
30.0000 mg | Freq: Once | INTRAMUSCULAR | Status: AC
  Administered 2021-03-06: 30 mg via INTRAVENOUS
  Filled 2021-03-06: qty 1

## 2021-03-06 MED ORDER — KETOROLAC TROMETHAMINE 30 MG/ML IJ SOLN
30.0000 mg | Freq: Once | INTRAMUSCULAR | Status: DC
  Filled 2021-03-06: qty 1

## 2021-03-06 MED ORDER — SENNOSIDES-DOCUSATE SODIUM 8.6-50 MG PO TABS
2.0000 | ORAL_TABLET | Freq: Every day | ORAL | Status: DC
  Administered 2021-03-06: 2 via ORAL
  Filled 2021-03-06: qty 2

## 2021-03-06 MED ORDER — ALPRAZOLAM 0.5 MG PO TABS: 0.5000 mg | ORAL_TABLET | Freq: Every evening | ORAL | Status: DC | PRN

## 2021-03-06 MED ORDER — VANCOMYCIN HCL 2000 MG/400ML IV SOLN
2000.0000 mg | Freq: Once | INTRAVENOUS | Status: AC
  Administered 2021-03-06: 2000 mg via INTRAVENOUS
  Filled 2021-03-06: qty 400

## 2021-03-06 MED ORDER — ONDANSETRON HCL 4 MG/2ML IJ SOLN
4.0000 mg | Freq: Four times a day (QID) | INTRAMUSCULAR | Status: DC | PRN
  Administered 2021-03-06 – 2021-03-07 (×2): 4 mg via INTRAVENOUS
  Filled 2021-03-06 (×2): qty 2

## 2021-03-06 MED ORDER — CHLORHEXIDINE GLUCONATE CLOTH 2 % EX PADS
6.0000 | MEDICATED_PAD | Freq: Every day | CUTANEOUS | Status: DC
  Administered 2021-03-07: 6 via TOPICAL

## 2021-03-06 MED ORDER — DULOXETINE HCL 60 MG PO CPEP
90.0000 mg | ORAL_CAPSULE | Freq: Every day | ORAL | Status: DC
  Administered 2021-03-06: 90 mg via ORAL
  Filled 2021-03-06: qty 1

## 2021-03-06 NOTE — Progress Notes (Signed)
Patient Demographics:    Carly [name], is a 40 y.o. female, DOB - 05-21-1981, PJA:[id number]  Admit date - 03/05/2021   Admitting Physician Bernadette Hoit, DO  Outpatient Primary MD for the patient is [name], MD  LOS - 1   Chief Complaint  Patient presents with   Shortness of Breath        Subjective:    Carly [name] today has no further fevers, no emesis,  No chest pain,   C/o Headache and Rt arm pain/swelling  Assessment  & Plan :    Active Problems:   Obesity, Class III, BMI 40-49.9 (morbid obesity) (HCC)   Breast cancer (HCC)   Cellulitis of right upper arm   Hyponatremia   Hypokalemia   Thrombocytopenia (HCC)   Lactic acidosis   Elevated d-dimer   Sepsis ([place])  Brief Summary:- 40 y.o. female with medical history significant for breast cancer (follows with Dr. Delton Coombes), chronic lymphedema of right upper extremity, peripheral neuropathy admitted on 03/05/2021 with sepsis secondary to right upper extremity cellulitis superimposed on chronic right upper extremity lymphedema in the setting of h/o breast cancer   A/p 1)Sepsis secondary to right upper extremity cellulitis---POA -Patient met sepsis criteria on admission with fevers, tachycardia and tachypnea -Lactic acid normalized with hydration -Sepsis pathophysiology resolving -Continue IV Rocephin pending culture data and continue IV fluids   2)RLS-continue Requip   3)Peripheral neuropathy--- continue Cymbalta and gabapentin   4)Morbid Obesity- -Low calorie diet, portion control and increase physical activity discussed with patient -Body mass index is 43.9 kg/m.   5)H/o Breast Ca and chronic right upper extremity lymphedema---- -patient follows with Dr. Chrisandra Netters---??  Acute, compounded by hemodilution from IV fluids for sepsis -Suspect some component of anemia of neoplasm -No evidence of ongoing  bleeding -= Monitor closely and transfuse as clinically indicated  7)Hyponatremia and Hypokalemia--resolved with hydration and replacements  Disposition/Need for in-Hospital Stay- patient unable to be discharged at this time due to --sepsis secondary to right arm cellulitis requiring IV antibiotics and IV fluids  Status is: Inpatient  Remains inpatient appropriate because: Please see disposition above  Disposition: The patient is from: Home              Anticipated d/c is to: Home              Anticipated d/c date is: 1 day              Patient currently is not medically stable to d/c. Barriers: Not Clinically Stable-   Code Status : -  Code Status: Full Code   Family Communication:   NA (patient is alert, awake and coherent)   Consults  :  N/a  DVT Prophylaxis  :   - SCDs  SCDs Start: 03/05/21 0552  Lab Results  Component Value Date   PLT 119 (L) 03/06/2021    Inpatient Medications  Scheduled Meds:  anastrozole  1 mg Oral Daily   Chlorhexidine Gluconate Cloth  6 each Topical Daily   DULoxetine  90 mg Oral Daily   enoxaparin (LOVENOX) injection  60 mg Subcutaneous Q24H   gabapentin  600 mg Oral QAC lunch   gabapentin  900 mg Oral BID   ketorolac  30  mg Intravenous Once   rOPINIRole  2 mg Oral QHS   senna-docusate  2 tablet Oral QHS   Continuous Infusions:  cefTRIAXone (ROCEPHIN)  IV 2 g (03/06/21 0147)   PRN Meds:.acetaminophen, albuterol, ALPRAZolam, cyclobenzaprine, HYDROcodone-acetaminophen, ondansetron (ZOFRAN) IV  Anti-infectives (From admission, onward)    Start     Dose/Rate Route Frequency Ordered Stop   03/06/21 1130  vancomycin (VANCOREADY) IVPB 2000 mg/400 mL        2,000 mg 200 mL/hr over 120 Minutes Intravenous  Once 03/06/21 1003 03/06/21 1357   03/05/21 0230  cefTRIAXone (ROCEPHIN) 2 g in sodium chloride 0.9 % 100 mL IVPB        2 g 200 mL/hr over 30 Minutes Intravenous Every 24 hours 03/05/21 0221 03/12/21 0229         Objective:    Vitals:   03/05/21 1823 03/05/21 2105 03/06/21 0526 03/06/21 1308  BP: 134/82 (!) 99/57 109/73 116/73  Pulse: 97 (!) 125 (!) 105 77  Resp: '20  19 18  ' Temp: 98.6 F (37 C) 100.1 F (37.8 C) 99 F (37.2 C) 98.6 F (37 C)  TempSrc: Oral Oral Oral Oral  SpO2: 100% 97% 95% 99%  Weight:      Height:       Wt Readings from Last 3 Encounters:  03/05/21 127.1 kg  04/11/20 125.8 kg  05/14/19 126.1 kg    Intake/Output Summary (Last 24 hours) at 03/06/2021 1818 Last data filed at 03/06/2021 0900 Gross per 24 hour  Intake 0 ml  Output --  Net 0 ml   Physical Exam  Gen:- Awake Alert,  in no apparent distress  HEENT:- [place].AT, No sclera icterus Neck-Supple Neck,No JVD,.  Lungs-  CTAB , fair symmetrical air movement CV- S1, S2 normal, regular, Lt [place]-Acath Abd-  +ve B.Sounds, Abd Soft, No tenderness,  increased Truncal adiposity Extremity/Skin:- Rt UE -swelling, redness, warmt, tenderness,  - Pedal pulses present  Psych-affect is appropriate, oriented x3 Neuro-no new focal deficits, no tremors   Data Review:   Micro Results Recent Results (from the past 240 hour(s))  Resp Panel by RT-PCR (Flu A&B, Covid) Nasopharyngeal Swab     Status: None   Collection Time: 03/05/21  1:58 AM   Specimen: Nasopharyngeal Swab; Nasopharyngeal(NP) swabs in vial transport medium  Result Value Ref Range Status   SARS Coronavirus 2 by RT PCR NEGATIVE NEGATIVE Final    Comment: (NOTE) SARS-CoV-2 target nucleic acids are NOT DETECTED.  The SARS-CoV-2 RNA is generally detectable in upper respiratory specimens during the acute phase of infection. The lowest concentration of SARS-CoV-2 viral copies this assay can detect is 138 copies/mL. A negative result does not preclude SARS-Cov-2 infection and should not be used as the sole basis for treatment or other patient management decisions. A negative result may occur with  improper specimen collection/handling, submission of specimen other than  nasopharyngeal swab, presence of viral mutation(s) within the areas targeted by this assay, and inadequate number of viral copies(<138 copies/mL). A negative result must be combined with clinical observations, patient history, and epidemiological information. The expected result is Negative.  Fact Sheet for Patients:  EntrepreneurPulse.com.au  Fact Sheet for Healthcare Providers:  IncredibleEmployment.be  This test is no t yet approved or cleared by the [place] FDA and  has been authorized for detection and/or diagnosis of SARS-CoV-2 by FDA under an Emergency Use Authorization (EUA). This EUA will remain  in effect (meaning this test can be used) for the duration of the  COVID-19 declaration under Section 564(b)(1) of the Act, 21 U.S.C.section 360bbb-3(b)(1), unless the authorization is terminated  or revoked sooner.       Influenza A by PCR NEGATIVE NEGATIVE Final   Influenza B by PCR NEGATIVE NEGATIVE Final    Comment: (NOTE) The Xpert Xpress SARS-CoV-2/FLU/RSV plus assay is intended as an aid in the diagnosis of influenza from Nasopharyngeal swab specimens and should not be used as a sole basis for treatment. Nasal washings and aspirates are unacceptable for Xpert Xpress SARS-CoV-2/FLU/RSV testing.  Fact Sheet for Patients: EntrepreneurPulse.com.au  Fact Sheet for Healthcare Providers: IncredibleEmployment.be  This test is not yet approved or cleared by the [place] FDA and has been authorized for detection and/or diagnosis of SARS-CoV-2 by FDA under an Emergency Use Authorization (EUA). This EUA will remain in effect (meaning this test can be used) for the duration of the COVID-19 declaration under Section 564(b)(1) of the Act, 21 U.S.C. section 360bbb-3(b)(1), unless the authorization is terminated or revoked.  Performed at [facility], [address] [place], [place] [address]    Culture, blood (routine x 2)     Status: None (Preliminary result)   Collection Time: 03/05/21  2:53 AM   Specimen: Left Antecubital; Blood  Result Value Ref Range Status   Specimen Description LEFT ANTECUBITAL  Final   Special Requests   Final    BOTTLES DRAWN AEROBIC AND ANAEROBIC Blood Culture results may not be optimal due to an excessive volume of blood received in culture bottles   Culture   Final    NO GROWTH 1 DAY Performed at [facility], [address] [place], [place] [address]    Report Status PENDING  Incomplete  Urine Culture     Status: Abnormal   Collection Time: 03/05/21  3:18 AM   Specimen: In/Out Cath Urine  Result Value Ref Range Status   Specimen Description   Final    IN/OUT CATH URINE Performed at [facility], [address] [place], [place] [address]    Special Requests   Final    NONE Performed at [facility], [address], [place], [place] [address]    Culture MULTIPLE SPECIES PRESENT, SUGGEST RECOLLECTION (A)  Final   Report Status 03/06/2021 FINAL  Final  Culture, blood (routine x 2)     Status: None (Preliminary result)   Collection Time: 03/05/21  3:28 AM   Specimen: Left Antecubital; Blood  Result Value Ref Range Status   Specimen Description LEFT ANTECUBITAL  Final   Special Requests   Final    BOTTLES DRAWN AEROBIC AND ANAEROBIC Blood Culture adequate volume   Culture   Final    NO GROWTH 1 DAY Performed at [facility], [address], [place], [place] [address]    Report Status PENDING  Incomplete    Radiology Reports CT Angio Chest PE W and/or Wo Contrast  Result Date: 03/05/2021 CLINICAL DATA:  Shortness of breath. Exposure to COVID. Breast cancer 9 years ago with double mastectomy. fever. EXAM: CT ANGIOGRAPHY CHEST WITH CONTRAST TECHNIQUE: Multidetector CT imaging of the chest was performed using the standard protocol during bolus administration of intravenous contrast. Multiplanar CT image reconstructions and MIPs were obtained to  evaluate the vascular anatomy. CONTRAST:  51m OMNIPAQUE IOHEXOL 350 MG/ML SOLN COMPARISON:  Chest radiograph of earlier in the day. [facility] FINDINGS: Cardiovascular: The quality of this exam for evaluation of pulmonary embolism is poor. Limitations include patient body habitus, bolus timing, and  mild motion. No central or lobar embolism identified. Smaller emboli cannot be excluded. Normal aortic caliber. Mild cardiomegaly, without pericardial effusion. Left Port-A-Cath tip at high right atrium. Mediastinum/Nodes: Left-sided thyroid prominence, without dominant mass. Right axillary node dissection. No axillary or subpectoral adenopathy. No mediastinal or hilar adenopathy. No internal mammary adenopathy. Lungs/Pleura: No pleural fluid.  Clear lungs. Upper Abdomen: Normal imaged portions of the liver, spleen, stomach, pancreas, adrenal glands, kidneys, gallbladder. Musculoskeletal: Bilateral mastectomy and breast implants. No acute osseous abnormality. Review of the MIP images confirms the above findings. IMPRESSION: 1. Poor quality evaluation for pulmonary embolism. No central or lobar embolism identified. 2. No other explanation for shortness of breath. 3. Bilateral mastectomy and breast implants. No evidence of metastatic disease. Electronically Signed   By: Abigail Miyamoto M.D.   On: 03/05/2021 05:22   DG Chest Port 1 View  Result Date: 03/05/2021 CLINICAL DATA:  Shortness of breath, code sepsis EXAM: PORTABLE CHEST 1 VIEW COMPARISON:  None. FINDINGS: Lungs are clear.  No pleural effusion or pneumothorax. The heart is normal in size. Left chest power port terminates at the cavoatrial junction. Surgical clips along the right chest wall/axilla. IMPRESSION: No evidence of acute cardiopulmonary disease. Electronically Signed   By: Julian Hy M.D.   On: 03/05/2021 02:38     CBC Recent Labs  Lab 03/05/21 0224 03/06/21 0533  WBC 9.1 8.3  HGB 11.3* 9.9*  HCT 33.6* 30.3*   PLT 134* 119*  MCV 87.7 91.0  MCH 29.5 29.7  MCHC 33.6 32.7  RDW 13.2 13.5  LYMPHSABS 1.1  --   MONOABS 0.4  --   EOSABS 0.0  --   BASOSABS 0.0  --     Chemistries  Recent Labs  Lab 03/05/21 0224 03/05/21 0602 03/06/21 0533  NA 130*  --  137  K 3.3*  --  3.7  CL 101  --  104  CO2 23  --  26  GLUCOSE 128*  --  113*  BUN 12  --  7  CREATININE 0.75  --  0.59  CALCIUM 8.6*  --  8.7*  MG  --  1.3*  --   AST 19  --  13*  ALT 14  --  13  ALKPHOS 76  --  55  BILITOT 0.9  --  0.6   ------------------------------------------------------------------------------------------------------------------ No results for input(s): CHOL, HDL, LDLCALC, TRIG, CHOLHDL, LDLDIRECT in the last 72 hours.  Lab Results  Component Value Date   HGBA1C 4.6 (L) 04/11/2016   ------------------------------------------------------------------------------------------------------------------ No results for input(s): TSH, T4TOTAL, T3FREE, THYROIDAB in the last 72 hours.  Invalid input(s): FREET3 ------------------------------------------------------------------------------------------------------------------ No results for input(s): VITAMINB12, FOLATE, FERRITIN, TIBC, IRON, RETICCTPCT in the last 72 hours.  Coagulation profile Recent Labs  Lab 03/05/21 0224 03/06/21 0533  INR 1.0 1.3*    Recent Labs    03/05/21 0224  DDIMER 0.51*    Cardiac Enzymes No results for input(s): CKMB, TROPONINI, MYOGLOBIN in the last 168 hours.  Invalid input(s): CK ------------------------------------------------------------------------------------------------------------------ No results found for: BNP   Roxan Hockey M.D on 03/06/2021 at 6:18 PM  Go to [email or web address] - for contact info  [facility] - Office  [phone]

## 2021-03-07 ENCOUNTER — Encounter (HOSPITAL_COMMUNITY): Payer: Self-pay | Admitting: Internal Medicine

## 2021-03-07 LAB — BASIC METABOLIC PANEL
Anion gap: 8 (ref 5–15)
BUN: 8 mg/dL (ref 6–20)
CO2: 27 mmol/L (ref 22–32)
Calcium: 8.8 mg/dL — ABNORMAL LOW (ref 8.9–10.3)
Chloride: 106 mmol/L (ref 98–111)
Creatinine, Ser: 0.59 mg/dL (ref 0.44–1.00)
GFR, Estimated: >60 mL/min (ref >60)
Glucose, Bld: 112 mg/dL — ABNORMAL HIGH (ref 70–99)
Potassium: 3.8 mmol/L (ref 3.5–5.1)
Sodium: 141 mmol/L (ref 135–145)

## 2021-03-07 LAB — CBC
HCT: 30.7 % — ABNORMAL LOW (ref 36.0–46.0)
Hemoglobin: 10 g/dL — ABNORMAL LOW (ref 12.0–15.0)
MCH: 29.4 pg (ref 26.0–34.0)
MCHC: 32.6 g/dL (ref 30.0–36.0)
MCV: 90.3 fL (ref 80.0–100.0)
Platelets: 115 10*3/uL — ABNORMAL LOW (ref 150–400)
RBC: 3.4 MIL/uL — ABNORMAL LOW (ref 3.87–5.11)
RDW: 13.2 % (ref 11.5–15.5)
WBC: 4.4 10*3/uL (ref 4.0–10.5)
nRBC: 0 % (ref 0.0–0.2)

## 2021-03-07 MED ORDER — HEPARIN SOD (PORK) LOCK FLUSH 100 UNIT/ML IV SOLN
500.0000 [IU] | Freq: Once | INTRAVENOUS | Status: AC
  Administered 2021-03-07: 500 [IU] via INTRAVENOUS
  Filled 2021-03-07: qty 5

## 2021-03-07 MED ORDER — ALBUTEROL SULFATE HFA 108 (90 BASE) MCG/ACT IN AERS: 2.0000 | INHALATION_SPRAY | RESPIRATORY_TRACT | 1 refills | Status: DC | PRN

## 2021-03-07 MED ORDER — ONDANSETRON HCL 4 MG PO TABS: 4.0000 mg | ORAL_TABLET | Freq: Four times a day (QID) | ORAL | 0 refills | Status: DC | PRN

## 2021-03-07 MED ORDER — DOXYCYCLINE HYCLATE 100 MG PO TABS: 100.0000 mg | ORAL_TABLET | Freq: Two times a day (BID) | ORAL | 0 refills | Status: AC

## 2021-03-07 MED ORDER — PROMETHAZINE HCL 12.5 MG PO TABS: 12.5000 mg | ORAL_TABLET | ORAL | 0 refills | Status: DC | PRN

## 2021-03-07 MED ORDER — CEFDINIR 300 MG PO CAPS: 300.0000 mg | ORAL_CAPSULE | Freq: Two times a day (BID) | ORAL | 0 refills | Status: AC

## 2021-03-07 NOTE — Plan of Care (Signed)

## 2021-03-07 NOTE — Plan of Care (Signed)

## 2021-03-07 NOTE — Discharge Summary (Signed)
Carly [name], is a 40 y.o. female  DOB 21-Jun-1981  MRN [id number].  Admission date:  03/05/2021  Admitting Physician  Bernadette Hoit, DO  Discharge Date:  03/07/2021   Primary MD  [name], MD  Recommendations for primary care physician for things to follow:  1)Avoid ibuprofen/Advil/Aleve/Motrin/Goody Powders/Naproxen/BC powders/Meloxicam/Diclofenac/Indomethacin and other Nonsteroidal anti-inflammatory medications as these will make you more likely to bleed and can cause stomach ulcers, can also cause Kidney problems.   2)follow up with your Oncologist per Usual schedule  3)follow up with your [name], MD  for recheck in 1 week or so   Admission Diagnosis  Hypokalemia [E87.6] SOB (shortness of breath) [R06.02] Thrombocytopenia (HCC) [D69.6] Cellulitis of right upper extremity [L03.113] Normochromic normocytic anemia [D64.9] Elevated d-dimer [R79.89] Cellulitis of right upper arm [L03.113] Sepsis due to undetermined organism [facility]) [A41.9]   Discharge Diagnosis  Hypokalemia [E87.6] SOB (shortness of breath) [R06.02] Thrombocytopenia ([place]) [D69.6] Cellulitis of right upper extremity [L03.113] Normochromic normocytic anemia [D64.9] Elevated d-dimer [R79.89] Cellulitis of right upper arm [L03.113] Sepsis due to undetermined organism ([place]) [A41.9]    Principal Problem:   Sepsis due to right arm cellulitis Active Problems:   Cellulitis of right upper arm   Obesity, Class III, BMI 40-49.9 (morbid obesity) ([place])   Breast cancer (HCC)   Hyponatremia   Hypokalemia   Thrombocytopenia (HCC)   Lactic acidosis   Elevated d-dimer      Past Medical History:  Diagnosis Date   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   HPI: Carly [name] is a 40 y.o. female with medical history significant for breast cancer (follows with Dr. Delton Coombes), chronic lymphedema of right upper extremity, peripheral neuropathy who presents to the emergency department due to fever and chills which started at home around 11:30 PM last night (9/10) with a temperature of 102.68F.  Patient noted redness in right upper arm and complaining of worsening pain, she also complained of spread of the rash to the forearm by the time she arrived at the ED.  She complained of tingling sensation in all extremities.   ED Course:  In the emergency department, he was febrile with a temperature of 102.29F, tachycardic and tachypneic.  O2 sat was 99-100% on room air.  Work-up in the ED showed normocytic anemia, thrombocytopenia, hypokalemia, hyponatremia, thrombocytopenia, lactic acid 2.4, D-dimer 0.51, influenza A, B and SARS coronavirus 2 was negative. Chest x-ray showed no evidence of acute cardiopulmonary disease She was treated with Tylenol and Toradol due to fever, IV hydration per sepsis protocol was provided, she was started on IV ceftriaxone, potassium was replenished.  Hospitalist was asked to admit patient for further evaluation and management.  Hospital Course:      Brief Summary:- 40 y.o. female with medical history significant for breast cancer (follows with Dr. Delton Coombes), chronic lymphedema of right upper extremity, peripheral neuropathy admitted on 03/05/2021 with sepsis secondary to right upper extremity cellulitis superimposed on chronic right upper extremity lymphedema in the setting of h/o breast cancer   A/p 1)Sepsis secondary to right upper extremity cellulitis---POA -Patient met sepsis criteria on admission with fevers, tachycardia and tachypnea -Lactic acid normalized with hydration -Sepsis pathophysiology resolved -Treated with IV Rocephin and  vancomycin and IV fluids -Blood cultures NGTD, urine culture negative -WBC 9.1 >> 4.4 -Overall much improved, okay to discharge on Omnicef and doxycycline   2)RLS-continue Requip   3)Peripheral neuropathy--- continue Cymbalta and gabapentin   4)Morbid Obesity- -Low calorie diet, portion control and increase physical activity discussed with patient -Body mass index is 43.9 kg/m.   5)H/o Breast Ca and chronic right upper extremity lymphedema---- -patient follows with Dr. Chrisandra Netters---??  Acute, compounded by hemodilution from IV fluids for sepsis -Suspect some component of anemia of neoplasm -No evidence of ongoing bleeding Hemoglobin stable around 10   7)Hyponatremia and Hypokalemia--resolved with hydration and replacements Na 130 >>141 K= -- 3.3 >> 3.8   Disposition--Home in stable condition    Disposition: The patient is from: Home              Anticipated d/c is to: Home              Code Status : -  Code Status: Full Code    Family Communication:   NA (patient is alert, awake and coherent)    Consults  :  N/a  Discharge Condition: stable  Follow UP   Follow-up Information     Hasanaj, Samul Dada, MD. Schedule an appointment as soon as possible for a visit in 1 week(s).   Specialty: Internal Medicine Contact information: [place] [address] 403 [phone]                  Consults obtained - na  Diet and Activity recommendation:  As advised  Discharge Instructions    Discharge Instructions     Call MD for:  difficulty breathing, headache or visual disturbances   Complete by: As directed    Call MD for:  persistant dizziness or light-headedness   Complete by: As directed    Call MD for:  persistant nausea and vomiting   Complete by: As directed    Call MD for:  redness, tenderness, or signs of infection (pain, swelling, redness, odor or green/yellow discharge around incision site)   Complete by: As directed    Call MD  for:  severe uncontrolled pain   Complete by: As directed    Call MD for:  temperature >100.4   Complete by: As directed    Diet - low sodium heart healthy   Complete by: As directed    Discharge instructions   Complete by: As directed    1)Avoid ibuprofen/Advil/Aleve/Motrin/Goody Powders/Naproxen/BC powders/Meloxicam/Diclofenac/Indomethacin and other Nonsteroidal anti-inflammatory medications as these will make you more likely to bleed and can cause stomach ulcers, can also cause Kidney problems.   2)follow up with your Oncologist per Usual schedule  3)follow up with your [name], MD  for recheck in 1 week or so   Increase activity slowly   Complete by: As directed          Discharge Medications     Allergies as  of 03/07/2021       Reactions   Latex Rash   Other Rash   Oozing blistery rash   Sumatriptan Other (See Comments)   Other reaction(s): Unknown Numbness of face and arms   Tape Rash   Oozing blistery rash        Medication List     TAKE these medications    albuterol 108 (90 Base) MCG/ACT inhaler Commonly known as: VENTOLIN HFA Inhale 2 puffs into the lungs every 2 (two) hours as needed for wheezing or shortness of breath.   ALPRAZolam 0.5 MG tablet Commonly known as: XANAX TAKE ONE TABLET BY MOUTH AT BEDTIME AS NEEDED FOR ANXIETY What changed: See the new instructions.   anastrozole 1 MG tablet Commonly known as: ARIMIDEX TAKE ONE TABLET BY MOUTH DAILY   ascorbic acid 100 MG tablet Commonly known as: VITAMIN C Take 100 mg by mouth daily.   CALCIUM 500 PO Take 1 tablet by mouth daily.   cefdinir 300 MG capsule Commonly known as: OMNICEF Take 1 capsule (300 mg total) by mouth 2 (two) times daily for 7 days.   cholecalciferol 25 MCG (1000 UNIT) tablet Commonly known as: VITAMIN D Take 1,000 Units by mouth daily.   cyanocobalamin 1000 MCG/ML injection Commonly known as: (VITAMIN B-12) 1,000 mg every 30 (thirty) days.   docusate  sodium 100 MG capsule Commonly known as: COLACE Take 100 mg by mouth daily as needed for mild constipation.   doxycycline 100 MG tablet Commonly known as: VIBRA-TABS Take 1 tablet (100 mg total) by mouth 2 (two) times daily for 7 days.   DULoxetine 60 MG capsule Commonly known as: CYMBALTA Take 1 capsule (60 mg total) by mouth daily.   DULoxetine 30 MG capsule Commonly known as: CYMBALTA Take 30 mg by mouth daily.   gabapentin 600 MG tablet Commonly known as: NEURONTIN Take 600 mg by mouth 3 (three) times daily.   HYDROcodone-acetaminophen 10-325 MG tablet Commonly known as: NORCO Take 1 tablet by mouth every 4 (four) hours as needed. What changed: reasons to take this   Misc. Devices Misc Please provide patient with a compression therapy pump fot her Right arm lymphedema.   MULTIVITAMIN ADULT PO Take 1 tablet daily by mouth.   ondansetron 4 MG tablet Commonly known as: ZOFRAN Take 1 tablet (4 mg total) by mouth every 6 (six) hours as needed. for nausea   phentermine 37.5 MG tablet Commonly known as: ADIPEX-P Take 37.5 mg by mouth daily.   prochlorperazine 10 MG tablet Commonly known as: COMPAZINE Take 10 mg by mouth as needed.   promethazine 12.5 MG tablet Commonly known as: PHENERGAN Take 1 tablet (12.5 mg total) by mouth every 4 (four) hours as needed for nausea or vomiting. What changed:  how much to take when to take this reasons to take this   rOPINIRole 2 MG tablet Commonly known as: REQUIP Take 2 mg by mouth daily as needed (leg pain).   Zinc Chelated 50 MG Tabs Take 1 tablet by mouth daily.        Major procedures and Radiology Reports - PLEASE review detailed and final reports for all details, in brief -   CT Angio Chest PE W and/or Wo Contrast  Result Date: 03/05/2021 CLINICAL DATA:  Shortness of breath. Exposure to COVID. Breast cancer 9 years ago with double mastectomy. fever. EXAM: CT ANGIOGRAPHY CHEST WITH CONTRAST TECHNIQUE:  Multidetector CT imaging of the chest was performed using the standard protocol during bolus  administration of intravenous contrast. Multiplanar CT image reconstructions and MIPs were obtained to evaluate the vascular anatomy. CONTRAST:  67m OMNIPAQUE IOHEXOL 350 MG/ML SOLN COMPARISON:  Chest radiograph of earlier in the day. [facility] FINDINGS: Cardiovascular: The quality of this exam for evaluation of pulmonary embolism is poor. Limitations include patient body habitus, bolus timing, and mild motion. No central or lobar embolism identified. Smaller emboli cannot be excluded. Normal aortic caliber. Mild cardiomegaly, without pericardial effusion. Left Port-A-Cath tip at high right atrium. Mediastinum/Nodes: Left-sided thyroid prominence, without dominant mass. Right axillary node dissection. No axillary or subpectoral adenopathy. No mediastinal or hilar adenopathy. No internal mammary adenopathy. Lungs/Pleura: No pleural fluid.  Clear lungs. Upper Abdomen: Normal imaged portions of the liver, spleen, stomach, pancreas, adrenal glands, kidneys, gallbladder. Musculoskeletal: Bilateral mastectomy and breast implants. No acute osseous abnormality. Review of the MIP images confirms the above findings. IMPRESSION: 1. Poor quality evaluation for pulmonary embolism. No central or lobar embolism identified. 2. No other explanation for shortness of breath. 3. Bilateral mastectomy and breast implants. No evidence of metastatic disease. Electronically Signed   By: KAbigail MiyamotoM.D.   On: 03/05/2021 05:22   DG Chest Port 1 View  Result Date: 03/05/2021 CLINICAL DATA:  Shortness of breath, code sepsis EXAM: PORTABLE CHEST 1 VIEW COMPARISON:  None. FINDINGS: Lungs are clear.  No pleural effusion or pneumothorax. The heart is normal in size. Left chest power port terminates at the cavoatrial junction. Surgical clips along the right chest wall/axilla. IMPRESSION: No evidence of acute  cardiopulmonary disease. Electronically Signed   By: SJulian HyM.D.   On: 03/05/2021 02:38    Micro Results   Recent Results (from the past 240 hour(s))  Resp Panel by RT-PCR (Flu A&B, Covid) Nasopharyngeal Swab     Status: None   Collection Time: 03/05/21  1:58 AM   Specimen: Nasopharyngeal Swab; Nasopharyngeal(NP) swabs in vial transport medium  Result Value Ref Range Status   SARS Coronavirus 2 by RT PCR NEGATIVE NEGATIVE Final    Comment: (NOTE) SARS-CoV-2 target nucleic acids are NOT DETECTED.  The SARS-CoV-2 RNA is generally detectable in upper respiratory specimens during the acute phase of infection. The lowest concentration of SARS-CoV-2 viral copies this assay can detect is 138 copies/mL. A negative result does not preclude SARS-Cov-2 infection and should not be used as the sole basis for treatment or other patient management decisions. A negative result may occur with  improper specimen collection/handling, submission of specimen other than nasopharyngeal swab, presence of viral mutation(s) within the areas targeted by this assay, and inadequate number of viral copies(<138 copies/mL). A negative result must be combined with clinical observations, patient history, and epidemiological information. The expected result is Negative.  Fact Sheet for Patients:  hEntrepreneurPulse.com.au Fact Sheet for Healthcare Providers:  hIncredibleEmployment.be This test is no t yet approved or cleared by the [place] and  has been authorized for detection and/or diagnosis of SARS-CoV-2 by FDA under an Emergency Use Authorization (EUA). This EUA will remain  in effect (meaning this test can be used) for the duration of the COVID-19 declaration under Section 564(b)(1) of the Act, 21 U.S.C.section 360bbb-3(b)(1), unless the authorization is terminated  or revoked sooner.       Influenza A by PCR NEGATIVE NEGATIVE Final   Influenza B  by PCR NEGATIVE NEGATIVE Final    Comment: (NOTE) The Xpert Xpress SARS-CoV-2/FLU/RSV plus assay is intended as an aid in the  diagnosis of influenza from Nasopharyngeal swab specimens and should not be used as a sole basis for treatment. Nasal washings and aspirates are unacceptable for Xpert Xpress SARS-CoV-2/FLU/RSV testing.  Fact Sheet for Patients: EntrepreneurPulse.com.au  Fact Sheet for Healthcare Providers: IncredibleEmployment.be  This test is not yet approved or cleared by the [place] FDA and has been authorized for detection and/or diagnosis of SARS-CoV-2 by FDA under an Emergency Use Authorization (EUA). This EUA will remain in effect (meaning this test can be used) for the duration of the COVID-19 declaration under Section 564(b)(1) of the Act, 21 U.S.C. section 360bbb-3(b)(1), unless the authorization is terminated or revoked.  Performed at [facility], [address], [place], [place] [address]   Culture, blood (routine x 2)     Status: None (Preliminary result)   Collection Time: 03/05/21  2:53 AM   Specimen: Left Antecubital; Blood  Result Value Ref Range Status   Specimen Description LEFT ANTECUBITAL  Final   Special Requests   Final    BOTTLES DRAWN AEROBIC AND ANAEROBIC Blood Culture results may not be optimal due to an excessive volume of blood received in culture bottles   Culture   Final    NO GROWTH 2 DAYS Performed at [facility], [address] [place], [place] [address]    Report Status PENDING  Incomplete  Urine Culture     Status: Abnormal   Collection Time: 03/05/21  3:18 AM   Specimen: In/Out Cath Urine  Result Value Ref Range Status   Specimen Description   Final    IN/OUT CATH URINE Performed at [facility], [address], [place], [place] [address]    Special Requests   Final    NONE Performed at [facility], [address], [place], [place] [address]    Culture MULTIPLE SPECIES PRESENT,  SUGGEST RECOLLECTION (A)  Final   Report Status 03/06/2021 FINAL  Final  Culture, blood (routine x 2)     Status: None (Preliminary result)   Collection Time: 03/05/21  3:28 AM   Specimen: Left Antecubital; Blood  Result Value Ref Range Status   Specimen Description LEFT ANTECUBITAL  Final   Special Requests   Final    BOTTLES DRAWN AEROBIC AND ANAEROBIC Blood Culture adequate volume   Culture   Final    NO GROWTH 2 DAYS Performed at [facility], [address] [place], [place] [address]    Report Status PENDING  Incomplete       Today   Subjective    Richell Corker today has no new copmplaints No fever  Or chills   No Nausea, Vomiting or Diarrhea Rt arm/UE swelling/warmt and tenderness is much improved  Patient has been seen and examined prior to discharge   Objective   Blood pressure 123/83, pulse 81, temperature 98.2 F (36.8 C), temperature source Oral, resp. rate 19, height '5\' 7"'  (1.702 m), weight 127.1 kg, SpO2 98 %.  No intake or output data in the 24 hours ending 03/07/21 1203  Exam Gen:- Awake Alert,  in no apparent distress, morbidly obese  HEENT:- [place].AT, No sclera icterus Neck-Supple Neck,No JVD,.  Lungs-  CTAB , fair symmetrical air movement CV- S1, S2 normal, regular, Lt [place]-Acath Abd-  +ve B.Sounds, Abd Soft, No tenderness,  increased Truncal adiposity Extremity/Skin:- Much Improved Rt UE -swelling, redness, warmt, tenderness,  - Pedal pulses present  Psych-affect is appropriate, oriented x3 Neuro-no new focal deficits, no tremors   Data Review   CBC w Diff:  Lab Results  Component Value Date   WBC 4.4 03/07/2021   HGB 10.0 (L) 03/07/2021   HCT 30.7 (L) 03/07/2021   PLT 115 (L) 03/07/2021   LYMPHOPCT 12 03/05/2021   MONOPCT 5 03/05/2021   EOSPCT 0 03/05/2021   BASOPCT 0 03/05/2021    CMP:  Lab Results  Component Value Date   NA 141 03/07/2021   K 3.8 03/07/2021   CL 106 03/07/2021   CO2 27 03/07/2021   BUN 8 03/07/2021    CREATININE 0.59 03/07/2021   PROT 5.9 (L) 03/06/2021   ALBUMIN 3.0 (L) 03/06/2021   BILITOT 0.6 03/06/2021   ALKPHOS 55 03/06/2021   AST 13 (L) 03/06/2021   ALT 13 03/06/2021  .   Total Discharge time is about 33 minutes  Roxan Hockey M.D on 03/07/2021 at 12:03 PM  Go to [email or web address] -  for contact info  [facility] - Office  [phone]

## 2021-03-07 NOTE — Discharge Instructions (Signed)
1)Avoid ibuprofen/Advil/Aleve/Motrin/Goody Powders/Naproxen/BC powders/Meloxicam/Diclofenac/Indomethacin and other Nonsteroidal anti-inflammatory medications as these will make you more likely to bleed and can cause stomach ulcers, can also cause Kidney problems.   2)follow up with your Oncologist per Usual schedule  3)follow up with your [name], MD  for recheck in 1 week or so

## 2021-03-11 LAB — CULTURE, BLOOD (ROUTINE X 2)
Culture: NO GROWTH
Culture: NO GROWTH
Special Requests: ADEQUATE

## 2021-03-14 DIAGNOSIS — A4189 Other specified sepsis: Secondary | ICD-10-CM | POA: Diagnosis not present

## 2021-03-14 DIAGNOSIS — Z6841 Body Mass Index (BMI) 40.0 and over, adult: Secondary | ICD-10-CM | POA: Diagnosis not present

## 2021-04-25 ENCOUNTER — Other Ambulatory Visit (HOSPITAL_COMMUNITY): Payer: Self-pay | Admitting: Hematology

## 2021-04-25 DIAGNOSIS — C50911 Malignant neoplasm of unspecified site of right female breast: Secondary | ICD-10-CM

## 2021-04-25 DIAGNOSIS — Z1509 Genetic susceptibility to other malignant neoplasm: Secondary | ICD-10-CM

## 2021-04-25 DIAGNOSIS — Z1501 Genetic susceptibility to malignant neoplasm of breast: Secondary | ICD-10-CM

## 2021-04-28 ENCOUNTER — Other Ambulatory Visit (HOSPITAL_COMMUNITY): Payer: Self-pay | Admitting: Hematology

## 2021-04-28 DIAGNOSIS — C50911 Malignant neoplasm of unspecified site of right female breast: Secondary | ICD-10-CM

## 2021-04-29 ENCOUNTER — Other Ambulatory Visit: Payer: Self-pay | Admitting: Nurse Practitioner

## 2021-05-10 DIAGNOSIS — G620 Drug-induced polyneuropathy: Secondary | ICD-10-CM | POA: Diagnosis not present

## 2021-05-10 DIAGNOSIS — F1120 Opioid dependence, uncomplicated: Secondary | ICD-10-CM | POA: Diagnosis not present

## 2021-06-30 ENCOUNTER — Other Ambulatory Visit (HOSPITAL_COMMUNITY): Payer: Self-pay

## 2021-06-30 NOTE — Telephone Encounter (Signed)
Patient requesting refill for Vitamin B12 injections.  Last seen 04/20/2020 by NP.  No appointments scheduled.  Patient notified to call PCP for refills.

## 2021-07-20 ENCOUNTER — Encounter (HOSPITAL_COMMUNITY): Payer: Self-pay | Admitting: Hematology

## 2021-07-20 ENCOUNTER — Other Ambulatory Visit: Payer: Self-pay | Admitting: *Deleted

## 2021-07-20 MED ORDER — CYANOCOBALAMIN 1000 MCG/ML IJ SOLN: 1000.0000 ug | INTRAMUSCULAR | 0 refills | Status: DC

## 2021-07-21 ENCOUNTER — Other Ambulatory Visit (HOSPITAL_COMMUNITY): Payer: Self-pay

## 2021-07-21 DIAGNOSIS — Z79811 Long term (current) use of aromatase inhibitors: Secondary | ICD-10-CM

## 2021-07-21 DIAGNOSIS — Z170 Estrogen receptor positive status [ER+]: Secondary | ICD-10-CM

## 2021-07-21 DIAGNOSIS — M8580 Other specified disorders of bone density and structure, unspecified site: Secondary | ICD-10-CM

## 2021-07-21 DIAGNOSIS — C50511 Malignant neoplasm of lower-outer quadrant of right female breast: Secondary | ICD-10-CM

## 2021-07-24 ENCOUNTER — Other Ambulatory Visit: Payer: Self-pay

## 2021-07-24 ENCOUNTER — Inpatient Hospital Stay (HOSPITAL_COMMUNITY): Payer: No Typology Code available for payment source | Attending: Hematology

## 2021-07-24 ENCOUNTER — Inpatient Hospital Stay (HOSPITAL_COMMUNITY): Payer: No Typology Code available for payment source | Admitting: Hematology

## 2021-07-24 DIAGNOSIS — Z79811 Long term (current) use of aromatase inhibitors: Secondary | ICD-10-CM | POA: Diagnosis not present

## 2021-07-24 DIAGNOSIS — Z79899 Other long term (current) drug therapy: Secondary | ICD-10-CM | POA: Diagnosis not present

## 2021-07-24 DIAGNOSIS — Z170 Estrogen receptor positive status [ER+]: Secondary | ICD-10-CM | POA: Diagnosis not present

## 2021-07-24 DIAGNOSIS — M8580 Other specified disorders of bone density and structure, unspecified site: Secondary | ICD-10-CM

## 2021-07-24 DIAGNOSIS — M810 Age-related osteoporosis without current pathological fracture: Secondary | ICD-10-CM | POA: Insufficient documentation

## 2021-07-24 DIAGNOSIS — E538 Deficiency of other specified B group vitamins: Secondary | ICD-10-CM | POA: Insufficient documentation

## 2021-07-24 DIAGNOSIS — C50511 Malignant neoplasm of lower-outer quadrant of right female breast: Secondary | ICD-10-CM | POA: Diagnosis not present

## 2021-07-24 DIAGNOSIS — E559 Vitamin D deficiency, unspecified: Secondary | ICD-10-CM | POA: Insufficient documentation

## 2021-07-24 LAB — CBC WITH DIFFERENTIAL/PLATELET
Abs Immature Granulocytes: 0 10*3/uL (ref 0.00–0.07)
Basophils Absolute: 0 10*3/uL (ref 0.0–0.1)
Basophils Relative: 0 %
Eosinophils Absolute: 0.1 10*3/uL (ref 0.0–0.5)
Eosinophils Relative: 2 %
HCT: 39.8 % (ref 36.0–46.0)
Hemoglobin: 13.4 g/dL (ref 12.0–15.0)
Immature Granulocytes: 0 %
Lymphocytes Relative: 32 %
Lymphs Abs: 1.3 10*3/uL (ref 0.7–4.0)
MCH: 29.6 pg (ref 26.0–34.0)
MCHC: 33.7 g/dL (ref 30.0–36.0)
MCV: 88.1 fL (ref 80.0–100.0)
Monocytes Absolute: 0.3 10*3/uL (ref 0.1–1.0)
Monocytes Relative: 7 %
Neutro Abs: 2.3 10*3/uL (ref 1.7–7.7)
Neutrophils Relative %: 59 %
Platelets: 184 10*3/uL (ref 150–400)
RBC: 4.52 MIL/uL (ref 3.87–5.11)
RDW: 13.2 % (ref 11.5–15.5)
WBC: 3.9 10*3/uL — ABNORMAL LOW (ref 4.0–10.5)
nRBC: 0 % (ref 0.0–0.2)

## 2021-07-24 LAB — IRON AND TIBC
Iron: 102 ug/dL (ref 28–170)
Saturation Ratios: 29 % (ref 10.4–31.8)
TIBC: 352 ug/dL (ref 250–450)
UIBC: 250 ug/dL

## 2021-07-24 LAB — COMPREHENSIVE METABOLIC PANEL
ALT: 17 U/L (ref 0–44)
AST: 16 U/L (ref 15–41)
Albumin: 4.2 g/dL (ref 3.5–5.0)
Alkaline Phosphatase: 85 U/L (ref 38–126)
Anion gap: 6 (ref 5–15)
BUN: 14 mg/dL (ref 6–20)
CO2: 26 mmol/L (ref 22–32)
Calcium: 9.3 mg/dL (ref 8.9–10.3)
Chloride: 106 mmol/L (ref 98–111)
Creatinine, Ser: 0.58 mg/dL (ref 0.44–1.00)
GFR, Estimated: >60 mL/min (ref >60)
Glucose, Bld: 111 mg/dL — ABNORMAL HIGH (ref 70–99)
Potassium: 3.7 mmol/L (ref 3.5–5.1)
Sodium: 138 mmol/L (ref 135–145)
Total Bilirubin: 0.6 mg/dL (ref 0.3–1.2)
Total Protein: 7.7 g/dL (ref 6.5–8.1)

## 2021-07-24 LAB — LACTATE DEHYDROGENASE: LDH: 155 U/L (ref 98–192)

## 2021-07-24 LAB — VITAMIN D 25 HYDROXY (VIT D DEFICIENCY, FRACTURES): Vit D, 25-Hydroxy: 29.8 ng/mL — ABNORMAL LOW (ref 30–100)

## 2021-07-24 LAB — FERRITIN: Ferritin: 68 ng/mL (ref 11–307)

## 2021-07-25 ENCOUNTER — Encounter (HOSPITAL_COMMUNITY): Payer: Self-pay

## 2021-07-25 ENCOUNTER — Other Ambulatory Visit (HOSPITAL_COMMUNITY): Payer: Self-pay | Admitting: *Deleted

## 2021-07-25 DIAGNOSIS — M8580 Other specified disorders of bone density and structure, unspecified site: Secondary | ICD-10-CM

## 2021-07-25 DIAGNOSIS — E538 Deficiency of other specified B group vitamins: Secondary | ICD-10-CM | POA: Diagnosis not present

## 2021-07-25 LAB — CANCER ANTIGEN 15-3: CA 15-3: 17.3 U/mL (ref 0.0–25.0)

## 2021-07-25 LAB — VITAMIN B12: Vitamin B-12: 149 pg/mL — ABNORMAL LOW (ref 180–914)

## 2021-07-31 ENCOUNTER — Inpatient Hospital Stay (HOSPITAL_COMMUNITY): Payer: No Typology Code available for payment source

## 2021-07-31 ENCOUNTER — Other Ambulatory Visit: Payer: Self-pay

## 2021-07-31 ENCOUNTER — Inpatient Hospital Stay (HOSPITAL_COMMUNITY): Payer: No Typology Code available for payment source | Attending: Hematology | Admitting: Hematology

## 2021-07-31 VITALS — BP 143/67 | HR 101 | Temp 100.2°F | Resp 18

## 2021-07-31 VITALS — BP 132/91 | HR 110 | Temp 100.0°F | Resp 17 | Ht 67.0 in | Wt 279.5 lb

## 2021-07-31 DIAGNOSIS — Z923 Personal history of irradiation: Secondary | ICD-10-CM | POA: Diagnosis not present

## 2021-07-31 DIAGNOSIS — Z90722 Acquired absence of ovaries, bilateral: Secondary | ICD-10-CM | POA: Insufficient documentation

## 2021-07-31 DIAGNOSIS — Z1501 Genetic susceptibility to malignant neoplasm of breast: Secondary | ICD-10-CM | POA: Diagnosis not present

## 2021-07-31 DIAGNOSIS — L03113 Cellulitis of right upper limb: Secondary | ICD-10-CM | POA: Diagnosis not present

## 2021-07-31 DIAGNOSIS — Z170 Estrogen receptor positive status [ER+]: Secondary | ICD-10-CM | POA: Diagnosis not present

## 2021-07-31 DIAGNOSIS — M8580 Other specified disorders of bone density and structure, unspecified site: Secondary | ICD-10-CM | POA: Insufficient documentation

## 2021-07-31 DIAGNOSIS — Z90710 Acquired absence of both cervix and uterus: Secondary | ICD-10-CM | POA: Insufficient documentation

## 2021-07-31 DIAGNOSIS — Z803 Family history of malignant neoplasm of breast: Secondary | ICD-10-CM | POA: Diagnosis not present

## 2021-07-31 DIAGNOSIS — Z95828 Presence of other vascular implants and grafts: Secondary | ICD-10-CM

## 2021-07-31 DIAGNOSIS — Z79811 Long term (current) use of aromatase inhibitors: Secondary | ICD-10-CM

## 2021-07-31 DIAGNOSIS — F419 Anxiety disorder, unspecified: Secondary | ICD-10-CM | POA: Diagnosis not present

## 2021-07-31 DIAGNOSIS — Z79899 Other long term (current) drug therapy: Secondary | ICD-10-CM | POA: Diagnosis not present

## 2021-07-31 DIAGNOSIS — C50511 Malignant neoplasm of lower-outer quadrant of right female breast: Secondary | ICD-10-CM | POA: Insufficient documentation

## 2021-07-31 DIAGNOSIS — C50911 Malignant neoplasm of unspecified site of right female breast: Secondary | ICD-10-CM

## 2021-07-31 DIAGNOSIS — Z9221 Personal history of antineoplastic chemotherapy: Secondary | ICD-10-CM | POA: Diagnosis not present

## 2021-07-31 MED ORDER — SODIUM CHLORIDE 0.9% FLUSH
10.0000 mL | Freq: Once | INTRAVENOUS | Status: AC
  Administered 2021-07-31: 10 mL via INTRAVENOUS

## 2021-07-31 MED ORDER — DENOSUMAB 60 MG/ML ~~LOC~~ SOSY
60.0000 mg | PREFILLED_SYRINGE | Freq: Once | SUBCUTANEOUS | Status: AC
  Administered 2021-07-31: 60 mg via SUBCUTANEOUS
  Filled 2021-07-31: qty 1

## 2021-07-31 MED ORDER — SODIUM CHLORIDE 0.9 % IV SOLN: INTRAVENOUS | Status: DC

## 2021-07-31 MED ORDER — HEPARIN SOD (PORK) LOCK FLUSH 100 UNIT/ML IV SOLN
500.0000 [IU] | Freq: Once | INTRAVENOUS | Status: AC
  Administered 2021-07-31: 500 [IU] via INTRAVENOUS

## 2021-07-31 MED ORDER — ACETAMINOPHEN 325 MG PO TABS
650.0000 mg | ORAL_TABLET | Freq: Once | ORAL | Status: AC
  Administered 2021-07-31: 650 mg via ORAL
  Filled 2021-07-31: qty 2

## 2021-07-31 MED ORDER — VANCOMYCIN HCL IN DEXTROSE 1-5 GM/200ML-% IV SOLN
1000.0000 mg | Freq: Once | INTRAVENOUS | Status: AC
  Administered 2021-07-31: 1000 mg via INTRAVENOUS
  Filled 2021-07-31: qty 200

## 2021-07-31 MED ORDER — SULFAMETHOXAZOLE-TRIMETHOPRIM 800-160 MG PO TABS: 1.0000 | ORAL_TABLET | Freq: Two times a day (BID) | ORAL | 0 refills | Status: DC

## 2021-07-31 NOTE — Progress Notes (Signed)
Infusion and injection  given per orders. Patient tolerated it well without problems. Vitals stable and discharged home from clinic ambulatory. Follow up as scheduled.

## 2021-07-31 NOTE — Patient Instructions (Addendum)
[place] at [facility] Discharge Instructions   You were seen and examined today by Dr. Delton Coombes.  He reviewed the results of your blood work which is normal/stable.  We will treat you in the clinic with IV antibiotics (Vancomycin) for one dose.  We also sent a prescription to your pharmacy for Bactrim DS.  You should take 1 pill twice a day until it is complete.  If your symptoms do not improve you should report to the ER for further evaluation and treatment.   Return as scheduled in 6 months.    Thank you for choosing [place] at [facility] to provide your oncology and hematology care.  To afford each patient quality time with our provider, please arrive at least 15 minutes before your scheduled appointment time.   If you have a lab appointment with the [facility] please come in thru the Main Entrance and check in at the main information desk.  You need to re-schedule your appointment should you arrive 10 or more minutes late.  We strive to give you quality time with our providers, and arriving late affects you and other patients whose appointments are after yours.  Also, if you no show three or more times for appointments you may be dismissed from the clinic at the providers discretion.     Again, thank you for choosing [facility].  Our hope is that these requests will decrease the amount of time that you wait before being seen by our physicians.       _____________________________________________________________  Should you have questions after your visit to [facility], please contact our office at [phone] and follow the prompts.  Our office hours are 8:00 a.m. and 4:30 p.m. Monday - Friday.  Please note that voicemails left after 4:00 p.m. may not be returned until the following business day.  We are closed weekends and major holidays.  You do have access to a nurse 24-7, just call the main number to  the clinic [phone] and do not press any options, hold on the line and a nurse will answer the phone.    For prescription refill requests, have your pharmacy contact our office and allow 72 hours.    Due to Covid, you will need to wear a mask upon entering the hospital. If you do not have a mask, a mask will be given to you at the Main Entrance upon arrival. For doctor visits, patients may have 1 support person age 47 or older with them. For treatment visits, patients can not have anyone with them due to social distancing guidelines and our immunocompromised population.

## 2021-07-31 NOTE — Progress Notes (Signed)
[facility] [address], [place] [address]   Patient Care Team: [name], MD as PCP - General (Internal Medicine)  SUMMARY OF ONCOLOGIC HISTORY: Oncology History  Breast cancer ([place])  05/06/2015 Initial Biopsy   Incisional biopsy with Dr. Maryclare Labrador of palpable mass, lower outer quadrant of reconstructed R breast close to nipple ER+ 97%, PR+ 70 (weak) HER 2 - by [facility]   07/06/2015 Surgery   Definitive lumpectomy and LN dissection performed revealing a 3.1 cm primary 1/14 positive LN with a macro metastases, satellite nodules adjacent to the primary, LVI-, however she had skin invasion along with satellite nodules, T4b N1a, Stage IIIB, grade IIII,   07/07/2015 Imaging   Per records negative CT C/A/P and bone scan   08/15/2015 - 11/22/2015 Chemotherapy   AC x 4, Taxol X 4 both given dose dense    - 01/27/2016 Radiation Therapy   Dr. Harlow Ohms. Pablo Ledger   02/06/2016 Imaging   Bone density- BMD as determined from [facility] L1-L4 is 1.021 g/cm2 with a T-Score of -1.3. This patient is considered osteopenic according to [facility]) criteria.      CHIEF COMPLIANT: Follow-up for breast cancer   INTERVAL HISTORY: [name] is a 41 y.o. female here today for follow up of her breast cancer. Her last visit was on 04/11/2020.   Today she reports feeling well. She reports pain and cellulitis in her right arm which she reports started today; she presented to the ED on 03/05/21 for cellulitis of her right upper arm. She reports fevers and chills.  She also reports pain in the ride side of her neck. She has a scratch on her right middle finger which she received 1 week ago, and which she reports is healing. She denies cough. She continues to take anastrozole and is tolerating it well.   REVIEW OF SYSTEMS:   Review of Systems  Constitutional:  Positive for chills, fatigue and fever. Negative for appetite change.  Respiratory:  Negative for cough.    Musculoskeletal:  Positive for arthralgias (7/10 R arm) and neck pain (R side).  Neurological:  Positive for dizziness and numbness.  Psychiatric/Behavioral:  Positive for sleep disturbance.   All other systems reviewed and are negative.  I have reviewed the past medical history, past surgical history, social history and family history with the patient and they are unchanged from previous note.   ALLERGIES:   is allergic to latex, other, sumatriptan, and tape.   MEDICATIONS:  Current Outpatient Medications  Medication Sig Dispense Refill   albuterol (VENTOLIN HFA) 108 (90 Base) MCG/ACT inhaler Inhale 2 puffs into the lungs every 2 (two) hours as needed for wheezing or shortness of breath. 18 g 1   ALPRAZolam (XANAX) 0.5 MG tablet TAKE ONE TABLET BY MOUTH AT BEDTIME AS NEEDED FOR ANXIETY 30 tablet 5   anastrozole (ARIMIDEX) 1 MG tablet TAKE ONE TABLET BY MOUTH ONCE DAILY 30 tablet 6   ascorbic acid (VITAMIN C) 100 MG tablet Take 100 mg by mouth daily.     Calcium-Magnesium-Vitamin D (CALCIUM 500 PO) Take 1 tablet by mouth daily.     cholecalciferol (VITAMIN D) 25 MCG (1000 UNIT) tablet Take 1,000 Units by mouth daily.     cyanocobalamin (,VITAMIN B-12,) 1000 MCG/ML injection Inject 1 mL (1,000 mcg total) into the muscle every 30 (thirty) days. 3 mL 0   docusate sodium (COLACE) 100 MG capsule Take 100 mg by mouth daily as  needed for mild constipation.     DULoxetine (CYMBALTA) 30 MG capsule Take 30 mg by mouth daily.   1   DULoxetine (CYMBALTA) 60 MG capsule Take 1 capsule (60 mg total) by mouth daily. 90 capsule 3   gabapentin (NEURONTIN) 600 MG tablet Take 600 mg by mouth 3 (three) times daily.  1   HYDROcodone-acetaminophen (NORCO) 10-325 MG tablet Take 1 tablet by mouth every 4 (four) hours as needed. (Patient taking differently: Take 1 tablet by mouth every 4 (four) hours as needed for moderate pain.) 10 tablet 0   Misc. Devices MISC Please provide patient with a compression therapy  pump fot her Right arm lymphedema. 1 each 0   Multiple Vitamins-Minerals (MULTIVITAMIN ADULT PO) Take 1 tablet daily by mouth.     ondansetron (ZOFRAN) 4 MG tablet Take 1 tablet (4 mg total) by mouth every 6 (six) hours as needed. for nausea 20 tablet 0   phentermine (ADIPEX-P) 37.5 MG tablet Take 37.5 mg by mouth daily.     prochlorperazine (COMPAZINE) 10 MG tablet Take 10 mg by mouth as needed.     promethazine (PHENERGAN) 12.5 MG tablet Take 1 tablet (12.5 mg total) by mouth every 4 (four) hours as needed for nausea or vomiting. 30 tablet 0   rOPINIRole (REQUIP) 2 MG tablet Take 2 mg by mouth daily as needed (leg pain).     sulfamethoxazole-trimethoprim (BACTRIM DS) 800-160 MG tablet Take 1 tablet by mouth 2 (two) times daily. 14 tablet 0   valACYclovir (VALTREX) 1000 MG tablet Take 2,000 mg by mouth 2 (two) times daily.     Zinc Chelated 50 MG TABS Take 1 tablet by mouth daily.     No current facility-administered medications for this visit.     PHYSICAL EXAMINATION: Performance status (ECOG): 1 - Symptomatic but completely ambulatory  Vitals:   07/31/21 1315  BP: (!) 132/91  Pulse: (!) 110  Resp: 17  Temp: 100 F (37.8 C)  SpO2: 100%   Wt Readings from Last 3 Encounters:  07/31/21 279 lb 8 oz (126.8 kg)  03/05/21 280 lb 4.8 oz (127.1 kg)  04/11/20 277 lb 5.4 oz (125.8 kg)   Physical Exam Vitals reviewed.  Constitutional:      Appearance: Normal appearance. She is obese.  Cardiovascular:     Rate and Rhythm: Normal rate and regular rhythm.     Pulses: Normal pulses.     Heart sounds: Normal heart sounds.  Pulmonary:     Effort: Pulmonary effort is normal.     Breath sounds: Normal breath sounds.  Chest:  Breasts:    Right: No mass, skin change (implants within normal limits) or tenderness.     Left: No swelling, bleeding, inverted nipple, mass, nipple discharge, skin change (within normal limits) or tenderness.  Abdominal:     Palpations: Abdomen is soft. There is  no hepatomegaly, splenomegaly or mass.     Tenderness: There is no abdominal tenderness.  Lymphadenopathy:     Cervical: Cervical adenopathy present.     Right cervical: Superficial cervical adenopathy present.     Upper Body:     Right upper body: No supraclavicular, axillary or pectoral adenopathy.     Left upper body: No supraclavicular, axillary or pectoral adenopathy.  Skin:    General: Skin is warm.     Findings: Erythema (circumference of R arm - blanching) present.  Neurological:     General: No focal deficit present.     Mental Status: She  is alert and oriented to person, place, and time.  Psychiatric:        Mood and Affect: Mood normal.        Behavior: Behavior normal.    Breast Exam Chaperone: Thana Ates     LABORATORY DATA:  I have reviewed the data as listed CMP Latest Ref Rng & Units 07/24/2021 03/07/2021 03/06/2021  Glucose 70 - 99 mg/dL 111(H) 112(H) 113(H)  BUN 6 - 20 mg/dL _0 Creatinine 0.44 - 1.00 mg/dL 0.58 0.59 0.59  Sodium 135 - 145 mmol/L 138 141 137  Potassium 3.5 - 5.1 mmol/L 3.7 3.8 3.7  Chloride 98 - 111 mmol/L 106 106 104  CO2 22 - 32 mmol/L _1 Calcium 8.9 - 10.3 mg/dL 9.3 8.8(L) 8.7(L)  Total Protein 6.5 - 8.1 g/dL 7.7 - 5.9(L)  Total Bilirubin 0.3 - 1.2 mg/dL 0.6 - 0.6  Alkaline Phos 38 - 126 U/L 85 - 55  AST 15 - 41 U/L 16 - 13(L)  ALT 0 - 44 U/L 17 - 13   Lab Results  Component Value Date   CAN153 17.3 07/24/2021   CAN153 20.1 10/11/2020   CAN153 16.9 04/08/2020   Lab Results  Component Value Date   WBC 3.9 (L) 07/24/2021   HGB 13.4 07/24/2021   HCT 39.8 07/24/2021   MCV 88.1 07/24/2021   PLT 184 07/24/2021   NEUTROABS 2.3 07/24/2021    ASSESSMENT:  1.  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  She had cancer of the right breast in 2017 and had lymph node  dissection.  Surgeries were done in [place]. - Adjuvant chemotherapy with dose dense AC followed by Taxol from 08/15/2015 through 11/22/2015, XRT completed on 01/27/2016. -Femara started in August 2017, switched to anastrozole around August 2018 for joint pains which have slightly improved. - TAH and BSO in 2016 prophylactically for her BRCA1 positivity. - Bone scan on 12/16/2018 did not show any evidence of metastatic disease.   2.  Family history: -Mother had breast cancer at age 36, sister who had breast cancer at age 20, maternal aunt breast cancer at age 29, father died of metastatic prostate cancer.  Paternal grandmother had metastatic cervical cancer.   3.  Osteopenia: -DEXA scan on 04/23/2018 shows T score of -1.6. -She was started on Prolia every 6 months in December 2017.   4.  Anxiety/sleep problems: -She is taking Xanax 0.5 mg at bedtime.   PLAN:  1.  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 - No palpable adenopathy. - Reviewed labs from 07/24/2021 showing normal LFTs.  CBC was grossly normal except mild leukopenia. - She had low B12 level of 149.  She was off of B12 for few months.  She reportedly started back on 07/24/2021 after the lab work.  Vitamin D was minimally low at 29.8.  Her anemia has improved with hemoglobin 13.4. - Recommend follow-up in 6 months.  2.  Right arm cellulitis: - She woke up with redness which is to third circumference of right arm.  It is hot to touch and tender.  Temperature is 100.  Denies any chills. - We will give her vancomycin 1 g IV today. - We will start her on Bactrim DS 1 tablet twice daily. - She  was reported to go to ER if there is any worsening.   3.  Osteopenia: - Calcium today is 9.3.  Continue Prolia monthly.    Breast Cancer therapy associated bone loss: I have recommended calcium, Vitamin D and weight  bearing exercises.  Orders placed this encounter:  No orders of the defined types were placed in this encounter.   The patient has a good understanding of the overall plan. She agrees with it. She will call with any problems that may develop before the next visit here.  Derek Jack, MD [place] [phone]   I, Thana Ates, am acting as a scribe for Dr. Derek Jack.  I, Derek Jack MD, have reviewed the above documentation for accuracy and completeness, and I agree with the above.

## 2021-08-15 DIAGNOSIS — M898X9 Other specified disorders of bone, unspecified site: Secondary | ICD-10-CM | POA: Diagnosis not present

## 2021-08-15 DIAGNOSIS — F1120 Opioid dependence, uncomplicated: Secondary | ICD-10-CM | POA: Diagnosis not present

## 2021-08-15 DIAGNOSIS — G620 Drug-induced polyneuropathy: Secondary | ICD-10-CM | POA: Diagnosis not present

## 2021-09-20 DIAGNOSIS — Z6841 Body Mass Index (BMI) 40.0 and over, adult: Secondary | ICD-10-CM | POA: Diagnosis not present

## 2021-09-20 DIAGNOSIS — J40 Bronchitis, not specified as acute or chronic: Secondary | ICD-10-CM | POA: Diagnosis not present

## 2021-10-12 ENCOUNTER — Other Ambulatory Visit: Payer: Self-pay | Admitting: Nurse Practitioner

## 2021-10-13 ENCOUNTER — Other Ambulatory Visit: Payer: Self-pay | Admitting: Nurse Practitioner

## 2021-10-25 ENCOUNTER — Other Ambulatory Visit: Payer: Self-pay | Admitting: Hematology

## 2021-11-01 DIAGNOSIS — F1120 Opioid dependence, uncomplicated: Secondary | ICD-10-CM | POA: Diagnosis not present

## 2021-11-01 DIAGNOSIS — M898X9 Other specified disorders of bone, unspecified site: Secondary | ICD-10-CM | POA: Diagnosis not present

## 2021-11-01 DIAGNOSIS — G620 Drug-induced polyneuropathy: Secondary | ICD-10-CM | POA: Diagnosis not present

## 2021-11-04 ENCOUNTER — Other Ambulatory Visit (HOSPITAL_COMMUNITY): Payer: Self-pay | Admitting: Hematology

## 2021-11-04 DIAGNOSIS — C50911 Malignant neoplasm of unspecified site of right female breast: Secondary | ICD-10-CM

## 2021-11-06 ENCOUNTER — Encounter (HOSPITAL_COMMUNITY): Payer: Self-pay | Admitting: Hematology

## 2021-11-23 ENCOUNTER — Other Ambulatory Visit (HOSPITAL_COMMUNITY): Payer: Self-pay | Admitting: Hematology

## 2021-11-23 DIAGNOSIS — Z1501 Genetic susceptibility to malignant neoplasm of breast: Secondary | ICD-10-CM

## 2021-11-23 DIAGNOSIS — C50911 Malignant neoplasm of unspecified site of right female breast: Secondary | ICD-10-CM

## 2021-11-29 ENCOUNTER — Other Ambulatory Visit (HOSPITAL_COMMUNITY): Payer: Self-pay | Admitting: Hematology

## 2021-11-30 ENCOUNTER — Encounter (HOSPITAL_COMMUNITY): Payer: Self-pay | Admitting: Hematology

## 2021-12-06 DIAGNOSIS — Z452 Encounter for adjustment and management of vascular access device: Secondary | ICD-10-CM | POA: Diagnosis not present

## 2022-01-17 DIAGNOSIS — Z6841 Body Mass Index (BMI) 40.0 and over, adult: Secondary | ICD-10-CM | POA: Diagnosis not present

## 2022-01-17 DIAGNOSIS — Z0000 Encounter for general adult medical examination without abnormal findings: Secondary | ICD-10-CM | POA: Diagnosis not present

## 2022-01-17 DIAGNOSIS — M545 Low back pain, unspecified: Secondary | ICD-10-CM | POA: Diagnosis not present

## 2022-01-22 ENCOUNTER — Inpatient Hospital Stay (HOSPITAL_COMMUNITY): Payer: No Typology Code available for payment source | Attending: Hematology

## 2022-01-22 DIAGNOSIS — Z79811 Long term (current) use of aromatase inhibitors: Secondary | ICD-10-CM | POA: Diagnosis not present

## 2022-01-22 DIAGNOSIS — Z6841 Body Mass Index (BMI) 40.0 and over, adult: Secondary | ICD-10-CM | POA: Diagnosis not present

## 2022-01-22 DIAGNOSIS — F1120 Opioid dependence, uncomplicated: Secondary | ICD-10-CM | POA: Diagnosis not present

## 2022-01-22 DIAGNOSIS — M898X9 Other specified disorders of bone, unspecified site: Secondary | ICD-10-CM | POA: Diagnosis not present

## 2022-01-22 DIAGNOSIS — Z170 Estrogen receptor positive status [ER+]: Secondary | ICD-10-CM | POA: Insufficient documentation

## 2022-01-22 DIAGNOSIS — C50911 Malignant neoplasm of unspecified site of right female breast: Secondary | ICD-10-CM

## 2022-01-22 DIAGNOSIS — G620 Drug-induced polyneuropathy: Secondary | ICD-10-CM | POA: Diagnosis not present

## 2022-01-22 DIAGNOSIS — C50511 Malignant neoplasm of lower-outer quadrant of right female breast: Secondary | ICD-10-CM | POA: Insufficient documentation

## 2022-01-22 LAB — COMPREHENSIVE METABOLIC PANEL
ALT: 15 U/L (ref 0–44)
AST: 15 U/L (ref 15–41)
Albumin: 4.1 g/dL (ref 3.5–5.0)
Alkaline Phosphatase: 45 U/L (ref 38–126)
Anion gap: 5 (ref 5–15)
BUN: 12 mg/dL (ref 6–20)
CO2: 29 mmol/L (ref 22–32)
Calcium: 9.2 mg/dL (ref 8.9–10.3)
Chloride: 106 mmol/L (ref 98–111)
Creatinine, Ser: 0.71 mg/dL (ref 0.44–1.00)
GFR, Estimated: >60 mL/min (ref >60)
Glucose, Bld: 81 mg/dL (ref 70–99)
Potassium: 3.8 mmol/L (ref 3.5–5.1)
Sodium: 140 mmol/L (ref 135–145)
Total Bilirubin: 0.6 mg/dL (ref 0.3–1.2)
Total Protein: 7.5 g/dL (ref 6.5–8.1)

## 2022-01-22 LAB — CBC WITH DIFFERENTIAL/PLATELET
Abs Immature Granulocytes: 0.01 10*3/uL (ref 0.00–0.07)
Basophils Absolute: 0 10*3/uL (ref 0.0–0.1)
Basophils Relative: 0 %
Eosinophils Absolute: 0.1 10*3/uL (ref 0.0–0.5)
Eosinophils Relative: 1 %
HCT: 38.6 % (ref 36.0–46.0)
Hemoglobin: 12.9 g/dL (ref 12.0–15.0)
Immature Granulocytes: 0 %
Lymphocytes Relative: 32 %
Lymphs Abs: 1.6 10*3/uL (ref 0.7–4.0)
MCH: 29.9 pg (ref 26.0–34.0)
MCHC: 33.4 g/dL (ref 30.0–36.0)
MCV: 89.4 fL (ref 80.0–100.0)
Monocytes Absolute: 0.4 10*3/uL (ref 0.1–1.0)
Monocytes Relative: 8 %
Neutro Abs: 2.8 10*3/uL (ref 1.7–7.7)
Neutrophils Relative %: 59 %
Platelets: 184 10*3/uL (ref 150–400)
RBC: 4.32 MIL/uL (ref 3.87–5.11)
RDW: 13.2 % (ref 11.5–15.5)
WBC: 4.9 10*3/uL (ref 4.0–10.5)
nRBC: 0 % (ref 0.0–0.2)

## 2022-01-22 LAB — VITAMIN B12: Vitamin B-12: 200 pg/mL (ref 180–914)

## 2022-01-22 LAB — VITAMIN D 25 HYDROXY (VIT D DEFICIENCY, FRACTURES): Vit D, 25-Hydroxy: 26.41 ng/mL — ABNORMAL LOW (ref 30–100)

## 2022-01-24 ENCOUNTER — Other Ambulatory Visit: Payer: Self-pay | Admitting: Hematology

## 2022-01-25 ENCOUNTER — Encounter (HOSPITAL_COMMUNITY): Payer: Self-pay | Admitting: Hematology

## 2022-01-29 ENCOUNTER — Inpatient Hospital Stay: Payer: No Typology Code available for payment source

## 2022-01-29 ENCOUNTER — Inpatient Hospital Stay: Payer: No Typology Code available for payment source | Attending: Hematology | Admitting: Hematology

## 2022-01-29 ENCOUNTER — Encounter: Payer: Self-pay | Admitting: Hematology

## 2022-01-29 VITALS — BP 136/83 | HR 94 | Temp 97.8°F | Resp 18 | Ht 67.0 in | Wt 279.7 lb

## 2022-01-29 DIAGNOSIS — Z803 Family history of malignant neoplasm of breast: Secondary | ICD-10-CM | POA: Diagnosis not present

## 2022-01-29 DIAGNOSIS — Z923 Personal history of irradiation: Secondary | ICD-10-CM | POA: Diagnosis not present

## 2022-01-29 DIAGNOSIS — Z170 Estrogen receptor positive status [ER+]: Secondary | ICD-10-CM | POA: Diagnosis not present

## 2022-01-29 DIAGNOSIS — Z79811 Long term (current) use of aromatase inhibitors: Secondary | ICD-10-CM

## 2022-01-29 DIAGNOSIS — Z90722 Acquired absence of ovaries, bilateral: Secondary | ICD-10-CM | POA: Insufficient documentation

## 2022-01-29 DIAGNOSIS — L538 Other specified erythematous conditions: Secondary | ICD-10-CM | POA: Diagnosis not present

## 2022-01-29 DIAGNOSIS — Z9221 Personal history of antineoplastic chemotherapy: Secondary | ICD-10-CM | POA: Insufficient documentation

## 2022-01-29 DIAGNOSIS — E538 Deficiency of other specified B group vitamins: Secondary | ICD-10-CM | POA: Insufficient documentation

## 2022-01-29 DIAGNOSIS — G4700 Insomnia, unspecified: Secondary | ICD-10-CM | POA: Insufficient documentation

## 2022-01-29 DIAGNOSIS — C50511 Malignant neoplasm of lower-outer quadrant of right female breast: Secondary | ICD-10-CM | POA: Diagnosis not present

## 2022-01-29 DIAGNOSIS — F419 Anxiety disorder, unspecified: Secondary | ICD-10-CM | POA: Insufficient documentation

## 2022-01-29 DIAGNOSIS — Z90710 Acquired absence of both cervix and uterus: Secondary | ICD-10-CM | POA: Diagnosis not present

## 2022-01-29 DIAGNOSIS — Z95828 Presence of other vascular implants and grafts: Secondary | ICD-10-CM

## 2022-01-29 DIAGNOSIS — Z79624 Long term (current) use of inhibitors of nucleotide synthesis: Secondary | ICD-10-CM | POA: Diagnosis not present

## 2022-01-29 DIAGNOSIS — Z1501 Genetic susceptibility to malignant neoplasm of breast: Secondary | ICD-10-CM | POA: Diagnosis not present

## 2022-01-29 DIAGNOSIS — Z8042 Family history of malignant neoplasm of prostate: Secondary | ICD-10-CM | POA: Diagnosis not present

## 2022-01-29 DIAGNOSIS — M8580 Other specified disorders of bone density and structure, unspecified site: Secondary | ICD-10-CM | POA: Insufficient documentation

## 2022-01-29 DIAGNOSIS — Z79899 Other long term (current) drug therapy: Secondary | ICD-10-CM | POA: Diagnosis not present

## 2022-01-29 DIAGNOSIS — C50012 Malignant neoplasm of nipple and areola, left female breast: Secondary | ICD-10-CM

## 2022-01-29 MED ORDER — HEPARIN SOD (PORK) LOCK FLUSH 100 UNIT/ML IV SOLN
500.0000 [IU] | Freq: Once | INTRAVENOUS | Status: AC
  Administered 2022-01-29: 500 [IU] via INTRAVENOUS

## 2022-01-29 MED ORDER — SODIUM CHLORIDE 0.9% FLUSH
10.0000 mL | INTRAVENOUS | Status: DC | PRN
  Administered 2022-01-29: 10 mL via INTRAVENOUS

## 2022-01-29 MED ORDER — DENOSUMAB 60 MG/ML ~~LOC~~ SOSY
60.0000 mg | PREFILLED_SYRINGE | Freq: Once | SUBCUTANEOUS | Status: AC
  Administered 2022-01-29: 60 mg via SUBCUTANEOUS
  Filled 2022-01-29: qty 1

## 2022-01-29 NOTE — Progress Notes (Signed)
Carly [name] presents today for prolia injection administered SQ in left Upper Arm. Administration without incident. Patient tolerated well.   Carly [name] presented for Portacath access and flush. Portacath located left chest wall accessed with  H 20 needle. Good blood return present. Portacath flushed with 54m NS and 500U/586mHeparin and needle removed intact. Procedure without incident. Patient tolerated procedure well.  Vitals stable and discharged home from clinic ambulatory. Follow up as scheduled.

## 2022-01-29 NOTE — Progress Notes (Signed)
[facility] [address] [place] [address]   Patient Care Team: Toma Deiters, MD as PCP - General (Internal Medicine)  SUMMARY OF ONCOLOGIC HISTORY: Oncology History  Breast cancer ([facility])  05/06/2015 Initial Biopsy   Incisional biopsy with Dr. Hulan Fray of palpable mass, lower outer quadrant of reconstructed R breast close to nipple ER+ 97%, PR+ 70 (weak) HER 2 - by [facility]   07/06/2015 Surgery   Definitive lumpectomy and LN dissection performed revealing a 3.1 cm primary 1/14 positive LN with a macro metastases, satellite nodules adjacent to the primary, LVI-, however she had skin invasion along with satellite nodules, T4b N1a, Stage IIIB, grade IIII,   07/07/2015 Imaging   Per records negative CT C/A/P and bone scan   08/15/2015 - 11/22/2015 Chemotherapy   AC x 4, Taxol X 4 both given dose dense    - 01/27/2016 Radiation Therapy   Dr. Pamelia Hoit. Michell Heinrich   02/06/2016 Imaging   Bone density- BMD as determined from [facility] L1-L4 is 1.021 g/cm2 with a T-Score of -1.3. This patient is considered osteopenic according to [facility]) criteria.      CHIEF COMPLIANT: Follow-up for breast cancer   INTERVAL HISTORY: Carly [name] is a 41 y.o. female is seen for follow-up of her breast cancer.  She is tolerating anastrozole very well.  She is also taking B12 injections monthly.  She reports taking calcium and vitamin D supplements.  No new onset pains reported.  Reports appetite at 100% and energy levels 25%.  REVIEW OF SYSTEMS:   Review of Systems  Constitutional:  Negative for appetite change, chills and fatigue.  Respiratory:  Negative for cough.   Musculoskeletal:  Negative for arthralgias and neck pain.  Neurological:  Negative for dizziness and numbness.  Psychiatric/Behavioral:  Negative for sleep disturbance.   All other systems reviewed and are negative.   I have reviewed the past medical history, past surgical history, social  history and family history with the patient and they are unchanged from previous note.   ALLERGIES:   is allergic to latex, other, sumatriptan, and tape.   MEDICATIONS:  Current Outpatient Medications  Medication Sig Dispense Refill   ALPRAZolam (XANAX) 0.5 MG tablet TAKE ONE TABLET BY MOUTH AT BEDTIME AS NEEDED FOR ANXIETY 30 tablet 5   anastrozole (ARIMIDEX) 1 MG tablet TAKE ONE TABLET BY MOUTH ONCE DAILY 30 tablet 6   ascorbic acid (VITAMIN C) 100 MG tablet Take 100 mg by mouth daily.     Calcium-Magnesium-Vitamin D (CALCIUM 500 PO) Take 1 tablet by mouth daily.     cyanocobalamin (VITAMIN B12) 1000 MCG/ML injection INJECT INTO THE MUSCLE EVERY 30 DAYS 3 mL 0   docusate sodium (COLACE) 100 MG capsule Take 100 mg by mouth daily as needed for mild constipation.     DULoxetine (CYMBALTA) 30 MG capsule Take 30 mg by mouth daily.   1   DULoxetine (CYMBALTA) 60 MG capsule Take 1 capsule (60 mg total) by mouth daily. 90 capsule 3   gabapentin (NEURONTIN) 600 MG tablet Take 600 mg by mouth 3 (three) times daily.  1   HYDROcodone-acetaminophen (NORCO) 10-325 MG tablet Take 1 tablet by mouth every 4 (four) hours as needed. (Patient taking differently: Take 1 tablet by mouth every 4 (four) hours as needed for moderate pain.) 10 tablet 0   Misc. Devices MISC Please provide patient with a compression therapy pump fot her Right arm lymphedema. 1  each 0   phentermine (ADIPEX-P) 37.5 MG tablet Take 37.5 mg by mouth daily.     albuterol (VENTOLIN HFA) 108 (90 Base) MCG/ACT inhaler Inhale 2 puffs into the lungs every 2 (two) hours as needed for wheezing or shortness of breath. (Patient not taking: Reported on 01/29/2022) 18 g 1   Multiple Vitamins-Minerals (MULTIVITAMIN ADULT PO) Take 1 tablet daily by mouth. (Patient not taking: Reported on 01/29/2022)     ondansetron (ZOFRAN) 4 MG tablet Take 1 tablet (4 mg total) by mouth every 6 (six) hours as needed. for nausea (Patient not taking: Reported on  01/29/2022) 20 tablet 0   prochlorperazine (COMPAZINE) 10 MG tablet Take 10 mg by mouth as needed. (Patient not taking: Reported on 01/29/2022)     promethazine (PHENERGAN) 12.5 MG tablet Take 1 tablet (12.5 mg total) by mouth every 4 (four) hours as needed for nausea or vomiting. (Patient not taking: Reported on 01/29/2022) 30 tablet 0   valACYclovir (VALTREX) 1000 MG tablet Take 2,000 mg by mouth 2 (two) times daily. (Patient not taking: Reported on 01/29/2022)     No current facility-administered medications for this visit.     PHYSICAL EXAMINATION: Performance status (ECOG): 1 - Symptomatic but completely ambulatory  Vitals:   01/29/22 1514  BP: 136/83  Pulse: 94  Resp: 18  Temp: 97.8 F (36.6 C)  SpO2: 97%   Wt Readings from Last 3 Encounters:  01/29/22 279 lb 11.2 oz (126.9 kg)  07/31/21 279 lb 8 oz (126.8 kg)  03/05/21 280 lb 4.8 oz (127.1 kg)   Physical Exam Vitals reviewed.  Constitutional:      Appearance: Normal appearance. She is obese.  Cardiovascular:     Rate and Rhythm: Normal rate and regular rhythm.     Pulses: Normal pulses.     Heart sounds: Normal heart sounds.  Pulmonary:     Effort: Pulmonary effort is normal.     Breath sounds: Normal breath sounds.  Chest:  Breasts:    Right: No mass, skin change (implants within normal limits) or tenderness.     Left: No swelling, bleeding, inverted nipple, mass, nipple discharge, skin change (within normal limits) or tenderness.  Abdominal:     Palpations: Abdomen is soft. There is no hepatomegaly, splenomegaly or mass.     Tenderness: There is no abdominal tenderness.  Lymphadenopathy:     Cervical: Cervical adenopathy present.     Right cervical: Superficial cervical adenopathy present.     Upper Body:     Right upper body: No supraclavicular, axillary or pectoral adenopathy.     Left upper body: No supraclavicular, axillary or pectoral adenopathy.  Skin:    General: Skin is warm.     Findings: Erythema  (circumference of R arm - blanching) present.  Neurological:     General: No focal deficit present.     Mental Status: She is alert and oriented to person, place, and time.  Psychiatric:        Mood and Affect: Mood normal.        Behavior: Behavior normal.    Breast Exam Chaperone: Carly [name]     LABORATORY DATA:  I have reviewed the data as listed    Latest Ref Rng & Units 01/22/2022    1:27 PM 07/24/2021    3:11 PM 03/07/2021    6:11 AM  CMP  Glucose 70 - 99 mg/dL 81  280  760   BUN 6 - 20 mg/dL 12  14  8   Creatinine 0.44 - 1.00 mg/dL 0.71  0.58  0.59   Sodium 135 - 145 mmol/L 140  138  141   Potassium 3.5 - 5.1 mmol/L 3.8  3.7  3.8   Chloride 98 - 111 mmol/L 106  106  106   CO2 22 - 32 mmol/L $RemoveB'29  26  27   '[id number]$ Calcium 8.9 - 10.3 mg/dL 9.2  9.3  8.8   Total Protein 6.5 - 8.1 g/dL 7.5  7.7    Total Bilirubin 0.3 - 1.2 mg/dL 0.6  0.6    Alkaline Phos 38 - 126 U/L 45  85    AST 15 - 41 U/L 15  16    ALT 0 - 44 U/L 15  17     Lab Results  Component Value Date   CAN153 17.3 07/24/2021   CAN153 20.1 10/11/2020   CAN153 16.9 04/08/2020   Lab Results  Component Value Date   WBC 4.9 01/22/2022   HGB 12.9 01/22/2022   HCT 38.6 01/22/2022   MCV 89.4 01/22/2022   PLT 184 01/22/2022   NEUTROABS 2.8 01/22/2022    ASSESSMENT:  1.  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  She had cancer of the right breast in 2017 and had lymph node dissection.  Surgeries were done in [place]. - Adjuvant chemotherapy with dose dense AC followed by Taxol from 08/15/2015 through 11/22/2015, XRT completed on 01/27/2016. -Femara started in August 2017, switched to anastrozole around August 2018 for joint pains which have slightly improved. - TAH and BSO in 2016 prophylactically for her BRCA1 positivity. - Bone scan on 12/16/2018 did not show any  evidence of metastatic disease.   2.  Family history: -Mother had breast cancer at age 96, sister who had breast cancer at age 84, maternal aunt breast cancer at age 75, father died of metastatic prostate cancer.  Paternal grandmother had metastatic cervical cancer.   3.  Osteopenia: -DEXA scan on 04/23/2018 shows T score of -1.6. -She was started on Prolia every 6 months in December 2017.   4.  Anxiety/sleep problems: -She is taking Xanax 0.5 mg at bedtime.   PLAN:  1.  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 - Reviewed labs from 01/22/2022 which showed normal LFTs, calcium and creatinine.  CBC was normal. - Continue anastrozole 1 mg daily which she is tolerating very well. - RTC 6 months for follow-up with repeat labs.    2.  Osteopenia: - Vitamin D is low at 26.4.  Recommend increasing dose of vitamin D. - Continue Prolia every 6 months.  3.  B12 deficiency: - Continue monthly B12 injections.  B12 level is 200.  We will repeat at next visit.    Breast Cancer therapy associated bone loss: I have recommended calcium, Vitamin D and weight bearing exercises.  Orders placed this encounter:  No orders of the defined types were placed in this encounter.   The patient has a good understanding of the overall plan. She agrees with it. She will call with any problems that may develop before the next visit here.  Derek Jack, MD [place] [phone]   I, Thana Ates, am acting as a scribe for Dr. Derek Jack.  Kinnie Scales MD,  have reviewed the above documentation for accuracy and completeness, and I agree with the above.

## 2022-01-29 NOTE — Patient Instructions (Addendum)
[place] at [facility] Discharge Instructions  You were seen and examined today by Dr. Delton Coombes.  Dr. Delton Coombes discussed your most recent lab work and everything looks good except for your Vitamin D level is low. Continue taking Vitamin D and Calcium along with Anastrozole daily.  Follow-up as scheduled in 6 months.    Thank you for choosing [place] at [facility] to provide your oncology and hematology care.  To afford each patient quality time with our provider, please arrive at least 15 minutes before your scheduled appointment time.   If you have a lab appointment with the [facility] please come in thru the Main Entrance and check in at the main information desk.  You need to re-schedule your appointment should you arrive 10 or more minutes late.  We strive to give you quality time with our providers, and arriving late affects you and other patients whose appointments are after yours.  Also, if you no show three or more times for appointments you may be dismissed from the clinic at the providers discretion.     Again, thank you for choosing [facility].  Our hope is that these requests will decrease the amount of time that you wait before being seen by our physicians.       _____________________________________________________________  Should you have questions after your visit to [facility], please contact our office at [phone] and follow the prompts.  Our office hours are 8:00 a.m. and 4:30 p.m. Monday - Friday.  Please note that voicemails left after 4:00 p.m. may not be returned until the following business day.  We are closed weekends and major holidays.  You do have access to a nurse 24-7, just call the main number to the clinic [phone] and do not press any options, hold on the line and a nurse will answer the phone.    For prescription refill requests, have your pharmacy contact our  office and allow 72 hours.

## 2022-03-15 DIAGNOSIS — Z008 Encounter for other general examination: Secondary | ICD-10-CM | POA: Diagnosis not present

## 2022-03-15 DIAGNOSIS — Z79891 Long term (current) use of opiate analgesic: Secondary | ICD-10-CM | POA: Diagnosis not present

## 2022-03-15 DIAGNOSIS — Z6841 Body Mass Index (BMI) 40.0 and over, adult: Secondary | ICD-10-CM | POA: Diagnosis not present

## 2022-03-15 DIAGNOSIS — G4700 Insomnia, unspecified: Secondary | ICD-10-CM | POA: Diagnosis not present

## 2022-03-15 DIAGNOSIS — C50911 Malignant neoplasm of unspecified site of right female breast: Secondary | ICD-10-CM | POA: Diagnosis not present

## 2022-03-15 DIAGNOSIS — Z79899 Other long term (current) drug therapy: Secondary | ICD-10-CM | POA: Diagnosis not present

## 2022-03-15 DIAGNOSIS — G8929 Other chronic pain: Secondary | ICD-10-CM | POA: Diagnosis not present

## 2022-03-15 DIAGNOSIS — G620 Drug-induced polyneuropathy: Secondary | ICD-10-CM | POA: Diagnosis not present

## 2022-04-12 DIAGNOSIS — G620 Drug-induced polyneuropathy: Secondary | ICD-10-CM | POA: Diagnosis not present

## 2022-04-25 ENCOUNTER — Other Ambulatory Visit (HOSPITAL_COMMUNITY): Payer: Self-pay | Admitting: Hematology

## 2022-04-25 DIAGNOSIS — C50911 Malignant neoplasm of unspecified site of right female breast: Secondary | ICD-10-CM

## 2022-05-01 ENCOUNTER — Inpatient Hospital Stay: Payer: No Typology Code available for payment source | Attending: Hematology

## 2022-05-01 DIAGNOSIS — C50511 Malignant neoplasm of lower-outer quadrant of right female breast: Secondary | ICD-10-CM | POA: Insufficient documentation

## 2022-05-01 DIAGNOSIS — Z452 Encounter for adjustment and management of vascular access device: Secondary | ICD-10-CM | POA: Insufficient documentation

## 2022-05-01 MED ORDER — HEPARIN SOD (PORK) LOCK FLUSH 100 UNIT/ML IV SOLN
500.0000 [IU] | Freq: Once | INTRAVENOUS | Status: AC
  Administered 2022-05-01: 500 [IU] via INTRAVENOUS

## 2022-05-01 MED ORDER — SODIUM CHLORIDE 0.9% FLUSH
10.0000 mL | INTRAVENOUS | Status: AC
  Administered 2022-05-01: 10 mL

## 2022-05-01 NOTE — Progress Notes (Signed)
Mathis Bud presented for Portacath access and flush.  Portacath located left chest wall accessed with  H 20 needle.  Good blood return present. Portacath flushed with 36m NS and 500U/534mHeparin and needle removed intact.  Procedure tolerated well and without incident. Vital signs stable. No complaints at this time. Discharged from clinic ambulatory in stable condition. Alert and oriented x 3. [facility].

## 2022-05-01 NOTE — Patient Instructions (Signed)
[facility]  Discharge Instructions: Thank you for choosing [facility] to provide your oncology and hematology care.  If you have a lab appointment with the Cancer Center, please come in thru the Main Entrance and check in at the main information desk.  Wear comfortable clothing and clothing appropriate for easy access to any Portacath or PICC line.   We strive to give you quality time with your provider. You may need to reschedule your appointment if you arrive late (15 or more minutes).  Arriving late affects you and other patients whose appointments are after yours.  Also, if you miss three or more appointments without notifying the office, you may be dismissed from the clinic at the provider's discretion.      For prescription refill requests, have your pharmacy contact our office and allow 72 hours for refills to be completed.    Today you received the following chemotherapy and/or immunotherapy agents port flush.      To help prevent nausea and vomiting after your treatment, we encourage you to take your nausea medication as directed.  BELOW ARE SYMPTOMS THAT SHOULD BE REPORTED IMMEDIATELY: *FEVER GREATER THAN 100.4 F (38 C) OR HIGHER *CHILLS OR SWEATING *NAUSEA AND VOMITING THAT IS NOT CONTROLLED WITH YOUR NAUSEA MEDICATION *UNUSUAL SHORTNESS OF BREATH *UNUSUAL BRUISING OR BLEEDING *URINARY PROBLEMS (pain or burning when urinating, or frequent urination) *BOWEL PROBLEMS (unusual diarrhea, constipation, pain near the anus) TENDERNESS IN MOUTH AND THROAT WITH OR WITHOUT PRESENCE OF ULCERS (sore throat, sores in mouth, or a toothache) UNUSUAL RASH, SWELLING OR PAIN  UNUSUAL VAGINAL DISCHARGE OR ITCHING   Items with * indicate a potential emergency and should be followed up as soon as possible or go to the Emergency Department if any problems should occur.  Please show the CHEMOTHERAPY ALERT CARD or IMMUNOTHERAPY ALERT CARD at check-in to the  Emergency Department and triage nurse.  Should you have questions after your visit or need to cancel or reschedule your appointment, please contact [facility] [phone]  and follow the prompts.  Office hours are 8:00 a.m. to 4:30 p.m. Monday - Friday. Please note that voicemails left after 4:00 p.m. may not be returned until the following business day.  We are closed weekends and major holidays. You have access to a nurse at all times for urgent questions. Please call the main number to the clinic [phone] and follow the prompts.  For any non-urgent questions, you may also contact your provider using MyChart. We now offer e-Visits for anyone 18 and older to request care online for non-urgent symptoms. For details visit [email or web address].   Also download the MyChart app! Go to the app store, search "MyChart", open the app, select [facility], and log in with your MyChart username and password.  Masks are optional in the cancer centers. If you would like for your care team to wear a mask while they are taking care of you, please let them know. You may have one support person who is at least 42 years old accompany you for your appointments.  

## 2022-05-02 DIAGNOSIS — Z0000 Encounter for general adult medical examination without abnormal findings: Secondary | ICD-10-CM | POA: Diagnosis not present

## 2022-05-02 DIAGNOSIS — Z6841 Body Mass Index (BMI) 40.0 and over, adult: Secondary | ICD-10-CM | POA: Diagnosis not present

## 2022-05-02 DIAGNOSIS — M545 Low back pain, unspecified: Secondary | ICD-10-CM | POA: Diagnosis not present

## 2022-05-18 ENCOUNTER — Emergency Department (HOSPITAL_COMMUNITY)
Admission: EM | Admit: 2022-05-18 | Discharge: 2022-05-18 | Disposition: A | Discharge status: Home / Self Care | Payer: No Typology Code available for payment source | Attending: Emergency Medicine | Admitting: Emergency Medicine

## 2022-05-18 ENCOUNTER — Other Ambulatory Visit: Payer: Self-pay

## 2022-05-18 ENCOUNTER — Encounter (HOSPITAL_COMMUNITY): Payer: Self-pay

## 2022-05-18 DIAGNOSIS — Z91040 Latex allergy status: Secondary | ICD-10-CM | POA: Insufficient documentation

## 2022-05-18 DIAGNOSIS — S61252A Open bite of right middle finger without damage to nail, initial encounter: Secondary | ICD-10-CM | POA: Diagnosis not present

## 2022-05-18 DIAGNOSIS — Y9389 Activity, other specified: Secondary | ICD-10-CM | POA: Insufficient documentation

## 2022-05-18 DIAGNOSIS — S61250A Open bite of right index finger without damage to nail, initial encounter: Secondary | ICD-10-CM | POA: Diagnosis not present

## 2022-05-18 DIAGNOSIS — S61259A Open bite of unspecified finger without damage to nail, initial encounter: Secondary | ICD-10-CM

## 2022-05-18 DIAGNOSIS — W540XXA Bitten by dog, initial encounter: Secondary | ICD-10-CM | POA: Insufficient documentation

## 2022-05-18 DIAGNOSIS — S6991XA Unspecified injury of right wrist, hand and finger(s), initial encounter: Secondary | ICD-10-CM | POA: Diagnosis present

## 2022-05-18 DIAGNOSIS — S60412A Abrasion of right middle finger, initial encounter: Secondary | ICD-10-CM | POA: Insufficient documentation

## 2022-05-18 HISTORY — DX: Anxiety disorder, unspecified: F41.9

## 2022-05-18 HISTORY — DX: Polyneuropathy, unspecified: G62.9

## 2022-05-18 HISTORY — DX: Other specified disorders of bone density and structure, unspecified site: M85.80

## 2022-05-18 MED ORDER — AMOXICILLIN-POT CLAVULANATE 875-125 MG PO TABS: 1.0000 | ORAL_TABLET | Freq: Two times a day (BID) | ORAL | 0 refills | Status: DC

## 2022-05-18 NOTE — ED Triage Notes (Signed)
Pt c/o laceration/puncture wound to R middle finger.  Pain score 4/10.  Slight redness noted.  Pt reports she was playing with her dog and his tooth cut her finger.  Sts she has had to get IV antibiotics for cuts in the past.

## 2022-05-18 NOTE — Discharge Instructions (Signed)
You need to see your doctor on Monday for a recheck, otherwise I do want you to come back to the hospital for severe or worsening swelling, fever, pain or any other worsening symptoms.

## 2022-05-18 NOTE — ED Provider Notes (Signed)
[facility] EMERGENCY DEPARTMENT Provider Note   CSN: [id number] Arrival date & time: 05/18/22  1323     History  Chief Complaint  Patient presents with   Finger Laceration    Carly [name] is a 41 y.o. female.  HPI   This patient is a 41 year old female who states that last night she was playing with her dog, they went for a toy at the same time and the dog accidentally bit her right middle finger at the distal tip of the finger over the DIP joint.  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  She has no fevers or chills.  Dog is up-to-date on vaccinations  Home Medications Prior to Admission medications   Medication Sig Start Date End Date Taking? Authorizing Provider  amoxicillin-clavulanate (AUGMENTIN) 875-125 MG tablet Take 1 tablet by mouth every 12 (twelve) hours. 05/18/22  Yes Noemi Chapel, MD  albuterol (VENTOLIN HFA) 108 (90 Base) MCG/ACT inhaler Inhale 2 puffs into the lungs every 2 (two) hours as needed for wheezing or shortness of breath. 03/07/21   Roxan Hockey, MD  ALPRAZolam Duanne Moron) 0.5 MG tablet TAKE ONE TABLET BY MOUTH AT BEDTIME AS NEEDED FOR ANXIETY 04/26/22   Derek Jack, MD  anastrozole (ARIMIDEX) 1 MG tablet TAKE ONE TABLET BY MOUTH ONCE DAILY 11/23/21   Derek Jack, MD  ascorbic acid (VITAMIN C) 100 MG tablet Take 100 mg by mouth daily.    [provider]  azithromycin (ZITHROMAX) 250 MG tablet Take by mouth. 04/24/22   [provider]  Calcium-Magnesium-Vitamin D (CALCIUM 500 PO) Take 1 tablet by mouth daily.    [provider]  cyanocobalamin (VITAMIN B12) 1000 MCG/ML injection INJECT 1ML INTO THE MUSCLE EVERY 30 DAYS 01/25/22   Derek Jack, MD  docusate sodium (COLACE) 100 MG capsule Take 100 mg by mouth daily as  needed for mild constipation. 07/05/15   [provider]  DULoxetine (CYMBALTA) 30 MG capsule Take 30 mg by mouth daily.  02/27/17   [provider]  DULoxetine (CYMBALTA) 60 MG capsule Take 1 capsule (60 mg total) by mouth daily. 06/14/16   Holley Bouche, NP  gabapentin (NEURONTIN) 600 MG tablet Take 600 mg by mouth 3 (three) times daily. 02/23/17   [provider]  HYDROcodone-acetaminophen (NORCO) 10-325 MG tablet Take 1 tablet by mouth every 4 (four) hours as needed. Patient taking differently: Take 1 tablet by mouth every 4 (four) hours as needed for moderate pain. 11/16/16   Holley Bouche, NP  Misc. Devices MISC Please provide patient with a compression therapy pump fot her Right arm lymphedema. 04/26/17   Twana First, MD  Multiple Vitamins-Minerals (MULTIVITAMIN ADULT PO) Take 1 tablet by mouth daily.    [provider]  ondansetron (ZOFRAN) 4 MG tablet Take 1 tablet (4 mg total) by mouth every 6 (six) hours as needed. for nausea 03/07/21   Roxan Hockey, MD  phentermine (ADIPEX-P) 37.5 MG tablet Take 37.5 mg by mouth daily. 11/30/19   [provider]  prochlorperazine (COMPAZINE) 10 MG tablet Take 10 mg by mouth as needed.    [provider]  promethazine (PHENERGAN) 12.5 MG tablet Take 1 tablet (12.5 mg total) by mouth every 4 (four) hours as needed for nausea or vomiting. 03/07/21   Emokpae, Courage,  MD  valACYclovir (VALTREX) 1000 MG tablet Take 2,000 mg by mouth 2 (two) times daily. 06/02/21   [provider]      Allergies    Latex, Other, Sumatriptan, and Tape    Review of Systems   Review of Systems  Constitutional:  Negative for fever.  Skin:  Positive for wound.    Physical Exam Updated Vital Signs BP (!) 146/89 (BP Location: Left Arm)   Pulse 89   Temp 97.9 F (36.6 C) (Oral)   Resp 18   Ht 1.702 m ('5\' 7"'$ )   Wt 126.6 kg   SpO2 98%   BMI 43.70 kg/m  Physical Exam Vitals and nursing note reviewed.   Constitutional:      Appearance: She is well-developed. She is not diaphoretic.  HENT:     Head: Normocephalic and atraumatic.  Eyes:     General:        Right eye: No discharge.        Left eye: No discharge.     Conjunctiva/sclera: Conjunctivae normal.  Pulmonary:     Effort: Pulmonary effort is normal. No respiratory distress.  Musculoskeletal:     Comments: Lymphedema of the right upper extremity, right middle finger with 2 small scabbed over abrasion type marks with small amount and redness, some decreased range of motion of the finger but no pain with flexion and extension to any significant degree.  Skin:    General: Skin is warm and dry.     Findings: No erythema or rash.  Neurological:     Mental Status: She is alert.     Coordination: Coordination normal.     ED Results / Procedures / Treatments   Labs (all labs ordered are listed, but only abnormal results are displayed) Labs Reviewed - No data to display  EKG None  Radiology No results found.  Procedures Procedures    Medications Ordered in ED Medications - No data to display  ED Course/ Medical Decision Making/ A&P                           Medical Decision Making Risk Prescription drug management.   The patient is able to hold her finger in both extension and somewhat in flexion without significant pain, I doubt that she has an infected joint and there is no signs of flexor tenosynovitis.  This does appear to be an early infection of a wound after dog bite and she will be placed on Augmentin.  Dog is up-to-date on vaccinations including rabies.  Vital signs unremarkable, patient agreeable for the plan and discharged        Final Clinical Impression(s) / ED Diagnoses Final diagnoses:  Dog bite of finger, initial encounter    Rx / DC Orders ED Discharge Orders          Ordered    amoxicillin-clavulanate (AUGMENTIN) 875-125 MG tablet  Every 12 hours        05/18/22 1615               Noemi Chapel, MD 05/18/22 1617

## 2022-05-25 ENCOUNTER — Other Ambulatory Visit: Payer: Self-pay | Admitting: Hematology

## 2022-06-26 ENCOUNTER — Other Ambulatory Visit (HOSPITAL_COMMUNITY): Payer: Self-pay | Admitting: Hematology

## 2022-06-26 DIAGNOSIS — Z1501 Genetic susceptibility to malignant neoplasm of breast: Secondary | ICD-10-CM

## 2022-06-26 DIAGNOSIS — C50911 Malignant neoplasm of unspecified site of right female breast: Secondary | ICD-10-CM

## 2022-07-12 DIAGNOSIS — F1120 Opioid dependence, uncomplicated: Secondary | ICD-10-CM | POA: Diagnosis not present

## 2022-07-12 DIAGNOSIS — G620 Drug-induced polyneuropathy: Secondary | ICD-10-CM | POA: Diagnosis not present

## 2022-07-19 ENCOUNTER — Encounter (HOSPITAL_COMMUNITY): Payer: Self-pay | Admitting: Hematology

## 2022-07-26 ENCOUNTER — Inpatient Hospital Stay: Payer: PPO

## 2022-07-26 ENCOUNTER — Other Ambulatory Visit: Payer: No Typology Code available for payment source

## 2022-07-30 ENCOUNTER — Inpatient Hospital Stay: Payer: PPO | Attending: Hematology

## 2022-07-30 VITALS — BP 128/85 | HR 87 | Temp 97.5°F | Resp 18

## 2022-07-30 DIAGNOSIS — Z95828 Presence of other vascular implants and grafts: Secondary | ICD-10-CM

## 2022-07-30 DIAGNOSIS — C50511 Malignant neoplasm of lower-outer quadrant of right female breast: Secondary | ICD-10-CM | POA: Diagnosis not present

## 2022-07-30 DIAGNOSIS — Z79624 Long term (current) use of inhibitors of nucleotide synthesis: Secondary | ICD-10-CM | POA: Insufficient documentation

## 2022-07-30 DIAGNOSIS — Z923 Personal history of irradiation: Secondary | ICD-10-CM | POA: Diagnosis not present

## 2022-07-30 DIAGNOSIS — Z79899 Other long term (current) drug therapy: Secondary | ICD-10-CM | POA: Insufficient documentation

## 2022-07-30 DIAGNOSIS — Z79811 Long term (current) use of aromatase inhibitors: Secondary | ICD-10-CM | POA: Insufficient documentation

## 2022-07-30 DIAGNOSIS — Z9221 Personal history of antineoplastic chemotherapy: Secondary | ICD-10-CM | POA: Diagnosis not present

## 2022-07-30 DIAGNOSIS — E538 Deficiency of other specified B group vitamins: Secondary | ICD-10-CM | POA: Diagnosis not present

## 2022-07-30 DIAGNOSIS — M79605 Pain in left leg: Secondary | ICD-10-CM | POA: Insufficient documentation

## 2022-07-30 DIAGNOSIS — M8588 Other specified disorders of bone density and structure, other site: Secondary | ICD-10-CM | POA: Diagnosis not present

## 2022-07-30 DIAGNOSIS — Z170 Estrogen receptor positive status [ER+]: Secondary | ICD-10-CM | POA: Diagnosis not present

## 2022-07-30 DIAGNOSIS — M79604 Pain in right leg: Secondary | ICD-10-CM | POA: Insufficient documentation

## 2022-07-30 LAB — COMPREHENSIVE METABOLIC PANEL
ALT: 18 U/L (ref 0–44)
AST: 17 U/L (ref 15–41)
Albumin: 3.9 g/dL (ref 3.5–5.0)
Alkaline Phosphatase: 38 U/L (ref 38–126)
Anion gap: 7 (ref 5–15)
BUN: 12 mg/dL (ref 6–20)
CO2: 27 mmol/L (ref 22–32)
Calcium: 8.8 mg/dL — ABNORMAL LOW (ref 8.9–10.3)
Chloride: 103 mmol/L (ref 98–111)
Creatinine, Ser: 0.6 mg/dL (ref 0.44–1.00)
GFR, Estimated: >60 mL/min (ref >60)
Glucose, Bld: 95 mg/dL (ref 70–99)
Potassium: 3.8 mmol/L (ref 3.5–5.1)
Sodium: 137 mmol/L (ref 135–145)
Total Bilirubin: 0.8 mg/dL (ref 0.3–1.2)
Total Protein: 7 g/dL (ref 6.5–8.1)

## 2022-07-30 LAB — CBC WITH DIFFERENTIAL/PLATELET
Abs Immature Granulocytes: 0 10*3/uL (ref 0.00–0.07)
Basophils Absolute: 0 10*3/uL (ref 0.0–0.1)
Basophils Relative: 1 %
Eosinophils Absolute: 0.1 10*3/uL (ref 0.0–0.5)
Eosinophils Relative: 1 %
HCT: 36 % (ref 36.0–46.0)
Hemoglobin: 11.9 g/dL — ABNORMAL LOW (ref 12.0–15.0)
Immature Granulocytes: 0 %
Lymphocytes Relative: 39 %
Lymphs Abs: 1.6 10*3/uL (ref 0.7–4.0)
MCH: 29.2 pg (ref 26.0–34.0)
MCHC: 33.1 g/dL (ref 30.0–36.0)
MCV: 88.5 fL (ref 80.0–100.0)
Monocytes Absolute: 0.3 10*3/uL (ref 0.1–1.0)
Monocytes Relative: 8 %
Neutro Abs: 2.1 10*3/uL (ref 1.7–7.7)
Neutrophils Relative %: 51 %
Platelets: 143 10*3/uL — ABNORMAL LOW (ref 150–400)
RBC: 4.07 MIL/uL (ref 3.87–5.11)
RDW: 13.1 % (ref 11.5–15.5)
WBC: 4.2 10*3/uL (ref 4.0–10.5)
nRBC: 0 % (ref 0.0–0.2)

## 2022-07-30 LAB — VITAMIN B12: Vitamin B-12: 254 pg/mL (ref 180–914)

## 2022-07-30 MED ORDER — HEPARIN SOD (PORK) LOCK FLUSH 100 UNIT/ML IV SOLN
500.0000 [IU] | Freq: Once | INTRAVENOUS | Status: AC
  Administered 2022-07-30: 500 [IU] via INTRAVENOUS

## 2022-07-30 MED ORDER — SODIUM CHLORIDE 0.9% FLUSH
10.0000 mL | INTRAVENOUS | Status: DC | PRN
  Administered 2022-07-30: 10 mL via INTRAVENOUS

## 2022-07-30 NOTE — Patient Instructions (Signed)
[facility]  Discharge Instructions: Thank you for choosing [facility] to provide your oncology and hematology care.  If you have a lab appointment with the Cancer Center, please come in thru the Main Entrance and check in at the main information desk.  Wear comfortable clothing and clothing appropriate for easy access to any Portacath or PICC line.   We strive to give you quality time with your provider. You may need to reschedule your appointment if you arrive late (15 or more minutes).  Arriving late affects you and other patients whose appointments are after yours.  Also, if you miss three or more appointments without notifying the office, you may be dismissed from the clinic at the provider's discretion.      For prescription refill requests, have your pharmacy contact our office and allow 72 hours for refills to be completed.    To help prevent nausea and vomiting after your treatment, we encourage you to take your nausea medication as directed.  BELOW ARE SYMPTOMS THAT SHOULD BE REPORTED IMMEDIATELY: *FEVER GREATER THAN 100.4 F (38 C) OR HIGHER *CHILLS OR SWEATING *NAUSEA AND VOMITING THAT IS NOT CONTROLLED WITH YOUR NAUSEA MEDICATION *UNUSUAL SHORTNESS OF BREATH *UNUSUAL BRUISING OR BLEEDING *URINARY PROBLEMS (pain or burning when urinating, or frequent urination) *BOWEL PROBLEMS (unusual diarrhea, constipation, pain near the anus) TENDERNESS IN MOUTH AND THROAT WITH OR WITHOUT PRESENCE OF ULCERS (sore throat, sores in mouth, or a toothache) UNUSUAL RASH, SWELLING OR PAIN  UNUSUAL VAGINAL DISCHARGE OR ITCHING   Items with * indicate a potential emergency and should be followed up as soon as possible or go to the Emergency Department if any problems should occur.  Please show the CHEMOTHERAPY ALERT CARD or IMMUNOTHERAPY ALERT CARD at check-in to the Emergency Department and triage nurse.  Should you have questions after your visit or need to  cancel or reschedule your appointment, please contact [facility] [phone]  and follow the prompts.  Office hours are 8:00 a.m. to 4:30 p.m. Monday - Friday. Please note that voicemails left after 4:00 p.m. may not be returned until the following business day.  We are closed weekends and major holidays. You have access to a nurse at all times for urgent questions. Please call the main number to the clinic [phone] and follow the prompts.  For any non-urgent questions, you may also contact your provider using MyChart. We now offer e-Visits for anyone 18 and older to request care online for non-urgent symptoms. For details visit [email or web address].   Also download the MyChart app! Go to the app store, search "MyChart", open the app, select , and log in with your MyChart username and password.   

## 2022-07-30 NOTE — Progress Notes (Signed)
Patients port flushed without difficulty.  Good blood return noted with no bruising or swelling noted at site.  Band aid applied.  VSS with discharge and left in satisfactory condition with no s/s of distress noted.   

## 2022-08-01 LAB — MISC LABCORP TEST (SEND OUT): Labcorp test code: 81950

## 2022-08-02 ENCOUNTER — Inpatient Hospital Stay: Payer: PPO

## 2022-08-02 ENCOUNTER — Encounter (HOSPITAL_COMMUNITY): Payer: Self-pay | Admitting: Hematology

## 2022-08-02 ENCOUNTER — Inpatient Hospital Stay (HOSPITAL_BASED_OUTPATIENT_CLINIC_OR_DEPARTMENT_OTHER): Payer: PPO | Admitting: Hematology

## 2022-08-02 VITALS — BP 114/69 | HR 92 | Temp 98.8°F | Resp 18 | Ht 67.0 in | Wt 279.6 lb

## 2022-08-02 DIAGNOSIS — Z170 Estrogen receptor positive status [ER+]: Secondary | ICD-10-CM | POA: Diagnosis not present

## 2022-08-02 DIAGNOSIS — C50011 Malignant neoplasm of nipple and areola, right female breast: Secondary | ICD-10-CM

## 2022-08-02 DIAGNOSIS — M8580 Other specified disorders of bone density and structure, unspecified site: Secondary | ICD-10-CM

## 2022-08-02 DIAGNOSIS — C50511 Malignant neoplasm of lower-outer quadrant of right female breast: Secondary | ICD-10-CM

## 2022-08-02 DIAGNOSIS — Z79811 Long term (current) use of aromatase inhibitors: Secondary | ICD-10-CM

## 2022-08-02 MED ORDER — DENOSUMAB 60 MG/ML ~~LOC~~ SOSY
60.0000 mg | PREFILLED_SYRINGE | Freq: Once | SUBCUTANEOUS | Status: AC
  Administered 2022-08-02: 60 mg via SUBCUTANEOUS
  Filled 2022-08-02: qty 1

## 2022-08-02 NOTE — Progress Notes (Signed)
[facility] [address], [place] [address]   Patient Care Team: [name], MD as PCP - General (Internal Medicine)  SUMMARY OF ONCOLOGIC HISTORY: Oncology History  Breast cancer ([place])  05/06/2015 Initial Biopsy   Incisional biopsy with Dr. Maryclare Labrador of palpable mass, lower outer quadrant of reconstructed R breast close to nipple ER+ 97%, PR+ 70 (weak) HER 2 - by [facility]   07/06/2015 Surgery   Definitive lumpectomy and LN dissection performed revealing a 3.1 cm primary 1/14 positive LN with a macro metastases, satellite nodules adjacent to the primary, LVI-, however she had skin invasion along with satellite nodules, T4b N1a, Stage IIIB, grade IIII,   07/07/2015 Imaging   Per records negative CT C/A/P and bone scan   08/15/2015 - 11/22/2015 Chemotherapy   AC x 4, Taxol X 4 both given dose dense    - 01/27/2016 Radiation Therapy   Dr. Harlow Ohms. Pablo Ledger   02/06/2016 Imaging   Bone density- BMD as determined from [facility] L1-L4 is 1.021 g/cm2 with a T-Score of -1.3. This patient is considered osteopenic according to [facility]) criteria.      CHIEF COMPLIANT: Follow-up for right breast cancer   INTERVAL HISTORY: [name] is a 42 y.o. female is seen for follow-up of her right breast cancer.  She was last seen by me on 01/29/2022.  She was seen in the ED on 05/18/2022 for a dog bite on her right middle finger. She was started on Augmentin.  Today, she states that she is doing well overall. Her appetite level is at 100%. Her energy level is at 70%. She states that she continues to have intermittent tingling in her hands and feet, but this is unchanged since last visit. She is tolerating the Anastrozole well for the most part but she does have bilateral leg pain diffusely. She describes the pain as achy and a 7/10 in severity. She goes to pain management who prescribes her Norco 10 for this pain. She has good relief of her leg pain with  Norco. She denies any fevers, chills, or night sweats.  She is receiving B12 injections monthly. She is receiving Prolia injections every 6 months. She denies any jaw pain. She takes a Vitamin D gummy supplement once daily.   REVIEW OF SYSTEMS:   Review of Systems  Constitutional:  Negative for chills, fatigue and fever.  HENT:   Negative for lump/mass, mouth sores, nosebleeds, sore throat and trouble swallowing.   Eyes:  Negative for eye problems.  Respiratory:  Negative for cough.   Cardiovascular:  Negative for chest pain, leg swelling and palpitations.  Gastrointestinal:  Negative for abdominal pain, constipation, diarrhea, nausea and vomiting.  Genitourinary:  Negative for bladder incontinence, difficulty urinating, dysuria, frequency, hematuria and nocturia.   Musculoskeletal:  Positive for myalgias (leg pain). Negative for arthralgias, back pain, flank pain and neck pain.  Skin:  Negative for itching and rash.  Neurological:  Negative for dizziness, headaches and numbness.       + tingling hands/feet  Hematological:  Does not bruise/bleed easily.  Psychiatric/Behavioral:  Negative for depression, sleep disturbance and suicidal ideas. The patient is not nervous/anxious.   All other systems reviewed and are negative.   I have reviewed the past medical history, past surgical history, social history and family history.   ALLERGIES:   is allergic to latex, other, sumatriptan, and tape.   MEDICATIONS:  Current Outpatient Medications  Medication Sig Dispense  Refill   ALPRAZolam (XANAX) 0.5 MG tablet TAKE ONE TABLET BY MOUTH AT BEDTIME AS NEEDED FOR ANXIETY 30 tablet 5   anastrozole (ARIMIDEX) 1 MG tablet TAKE ONE TABLET BY MOUTH ONCE DAILY 30 tablet 6   ascorbic acid (VITAMIN C) 100 MG tablet Take 100 mg by mouth daily.     Calcium-Magnesium-Vitamin D (CALCIUM 500 PO) Take 1 tablet by mouth daily.     cyanocobalamin (VITAMIN B12) 1000 MCG/ML injection INJECT 1ML INTO THE MUSCLE  EVERY 30 DAYS 3 mL 6   docusate sodium (COLACE) 100 MG capsule Take 100 mg by mouth daily as needed for mild constipation.     DULoxetine (CYMBALTA) 30 MG capsule Take 30 mg by mouth daily.   1   DULoxetine (CYMBALTA) 60 MG capsule Take 1 capsule (60 mg total) by mouth daily. 90 capsule 3   gabapentin (NEURONTIN) 600 MG tablet Take 600 mg by mouth 3 (three) times daily.  1   HYDROcodone-acetaminophen (NORCO) 10-325 MG tablet Take 1 tablet by mouth every 4 (four) hours as needed. (Patient taking differently: Take 1 tablet by mouth every 4 (four) hours as needed for moderate pain.) 10 tablet 0   Misc. Devices MISC Please provide patient with a compression therapy pump fot her Right arm lymphedema. 1 each 0   Multiple Vitamins-Minerals (MULTIVITAMIN ADULT PO) Take 1 tablet by mouth daily.     ondansetron (ZOFRAN) 4 MG tablet Take 1 tablet (4 mg total) by mouth every 6 (six) hours as needed. for nausea 20 tablet 0   phentermine (ADIPEX-P) 37.5 MG tablet Take 37.5 mg by mouth daily.     prochlorperazine (COMPAZINE) 10 MG tablet Take 10 mg by mouth as needed.     promethazine (PHENERGAN) 12.5 MG tablet Take 1 tablet (12.5 mg total) by mouth every 4 (four) hours as needed for nausea or vomiting. 30 tablet 0   valACYclovir (VALTREX) 1000 MG tablet Take 2,000 mg by mouth 2 (two) times daily.     No current facility-administered medications for this visit.     PHYSICAL EXAMINATION: Performance status (ECOG): 1 - Symptomatic but completely ambulatory  Vitals:   08/02/22 1500  BP: 114/69  Pulse: 92  Resp: 18  Temp: 98.8 F (37.1 C)  SpO2: 97%   Wt Readings from Last 3 Encounters:  08/02/22 126.8 kg (279 lb 9.6 oz)  05/18/22 126.6 kg (279 lb)  01/29/22 126.9 kg (279 lb 11.2 oz)   Physical Exam Vitals and nursing note reviewed. Exam conducted with a chaperone present.  Constitutional:      Appearance: Normal appearance.  Cardiovascular:     Rate and Rhythm: Normal rate and regular rhythm.      Pulses: Normal pulses.     Heart sounds: Normal heart sounds.  Pulmonary:     Effort: Pulmonary effort is normal.     Breath sounds: Normal breath sounds.  Chest:     Comments: Bilateral breast implants WNL. Abdominal:     Palpations: Abdomen is soft. There is no hepatomegaly, splenomegaly or mass.     Tenderness: There is no abdominal tenderness.  Lymphadenopathy:     Cervical: No cervical adenopathy.     Right cervical: No superficial, deep or posterior cervical adenopathy.    Left cervical: No superficial, deep or posterior cervical adenopathy.     Upper Body:     Right upper body: No supraclavicular, axillary or pectoral adenopathy.     Left upper body: No supraclavicular, axillary or pectoral adenopathy.  Neurological:     General: No focal deficit present.     Mental Status: She is alert and oriented to person, place, and time.  Psychiatric:        Mood and Affect: Mood normal.        Behavior: Behavior normal.    Breast Exam Chaperone: Chasity Edwards, LPN   LABORATORY DATA:  I have reviewed the data as listed    Latest Ref Rng & Units 07/30/2022    3:20 PM 01/22/2022    1:27 PM 07/24/2021    3:11 PM  CMP  Glucose 70 - 99 mg/dL 95  81  111   BUN 6 - 20 mg/dL '12  12  14   '$ Creatinine 0.44 - 1.00 mg/dL 0.60  0.71  0.58   Sodium 135 - 145 mmol/L 137  140  138   Potassium 3.5 - 5.1 mmol/L 3.8  3.8  3.7   Chloride 98 - 111 mmol/L 103  106  106   CO2 22 - 32 mmol/L '27  29  26   '$ Calcium 8.9 - 10.3 mg/dL 8.8  9.2  9.3   Total Protein 6.5 - 8.1 g/dL 7.0  7.5  7.7   Total Bilirubin 0.3 - 1.2 mg/dL 0.8  0.6  0.6   Alkaline Phos 38 - 126 U/L 38  45  85   AST 15 - 41 U/L '17  15  16   '$ ALT 0 - 44 U/L '18  15  17    '$ Lab Results  Component Value Date   CAN153 17.3 07/24/2021   CAN153 20.1 10/11/2020   CAN153 16.9 04/08/2020   Lab Results  Component Value Date   WBC 4.2 07/30/2022   HGB 11.9 (L) 07/30/2022   HCT 36.0 07/30/2022   MCV 88.5 07/30/2022   PLT 143 (L)  07/30/2022   NEUTROABS 2.1 07/30/2022   Lab Results  Component Value Date   VITAMINB12 254 07/30/2022    Lab Results  Component Value Date   VD25OH 26.41 (L) 01/22/2022    Component     Latest Ref Rng 10/11/2020 07/24/2021  LDH     98 - 192 U/L 170  155      ASSESSMENT:  1.  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  She had cancer of the right breast in 2017 and had lymph node dissection.  Surgeries were done in [place]. - Adjuvant chemotherapy with dose dense AC followed by Taxol from 08/15/2015 through 11/22/2015, XRT completed on 01/27/2016. -Femara started in August 2017, switched to anastrozole around August 2018 for joint pains which have slightly improved. - TAH and BSO in 2016 prophylactically for her BRCA1 positivity. - Bone scan on 12/16/2018 did not show any evidence of metastatic disease.   2.  Family history: -Mother had breast cancer at age 52, sister who had breast cancer at age 43, maternal aunt breast cancer at age 21, father died of metastatic prostate cancer.  Paternal grandmother had metastatic cervical cancer.   3.  Osteopenia: -DEXA scan on 04/23/2018 shows T score of -1.6. -She was started on Prolia every 6 months in December 2017. - DEXA scan 05/31/2020: T-score -1.7, osteopenia.  Compared to 04/23/2018, slight decrease in density.   PLAN:  1.  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  She has leg pains which have worsened since anastrozole started.  She goes to the pain clinic for control. - Bilateral breast implants are within normal limits with no palpable adenopathy. - Labs from 07/30/2022 with normal LFTs.  CBC was grossly normal.  We have sent CA 15-3 level today which she will follow.  She  will call us if any questions. - I have recommended MRI of the breast with and without contrast given her BRCA positivity and prior history of breast cancer. - I think it is important to continue postmastectomy surveillance with yearly MRI of the breast. - RTC 6 months for follow-up with repeat labs and exam.    2.  Osteopenia: - She is taking vitamin D Gummies.  Vitamin D level is 27. - Recommend increasing vitamin D to 800 international units daily.  Will repeat next in 6 months. - Continue Prolia today and every 6 months. - Will consider repeating DEXA scan in 1 year.  3.  B12 deficiency: - B12 level is 254.  Continue monthly B12 injections.    Breast Cancer therapy associated bone loss: I have recommended calcium, Vitamin D and weight bearing exercises.  Orders placed this encounter:  No orders of the defined types were placed in this encounter.   The patient has a good understanding of the overall plan. She agrees with it. She will call with any problems that may develop before the next visit here.    I,Alexis Herring,acting as a Education administrator for [employer], MD.,have documented all relevant documentation on the behalf of Derek Jack, MD,as directed by  Derek Jack, MD while in the presence of Derek Jack, MD.  I, Derek Jack MD, have reviewed the above documentation for accuracy and completeness, and I agree with the above.   Derek Jack, MD [place] [phone]

## 2022-08-02 NOTE — Patient Instructions (Signed)
[facility]  Discharge Instructions: Thank you for choosing [facility] to provide your oncology and hematology care.  If you have a lab appointment with the [facility], please come in thru the Main Entrance and check in at the main information desk.  Wear comfortable clothing and clothing appropriate for easy access to any Portacath or PICC line.   We strive to give you quality time with your provider. You may need to reschedule your appointment if you arrive late (15 or more minutes).  Arriving late affects you and other patients whose appointments are after yours.  Also, if you miss three or more appointments without notifying the office, you may be dismissed from the clinic at the provider's discretion.      For prescription refill requests, have your pharmacy contact our office and allow 72 hours for refills to be completed.    Today you received the following chemotherapy and/or immunotherapy agents Prolia      To help prevent nausea and vomiting after your treatment, we encourage you to take your nausea medication as directed.  BELOW ARE SYMPTOMS THAT SHOULD BE REPORTED IMMEDIATELY: *FEVER GREATER THAN 100.4 F (38 C) OR HIGHER *CHILLS OR SWEATING *NAUSEA AND VOMITING THAT IS NOT CONTROLLED WITH YOUR NAUSEA MEDICATION *UNUSUAL SHORTNESS OF BREATH *UNUSUAL BRUISING OR BLEEDING *URINARY PROBLEMS (pain or burning when urinating, or frequent urination) *BOWEL PROBLEMS (unusual diarrhea, constipation, pain near the anus) TENDERNESS IN MOUTH AND THROAT WITH OR WITHOUT PRESENCE OF ULCERS (sore throat, sores in mouth, or a toothache) UNUSUAL RASH, SWELLING OR PAIN  UNUSUAL VAGINAL DISCHARGE OR ITCHING   Items with * indicate a potential emergency and should be followed up as soon as possible or go to the Emergency Department if any problems should occur.  Please show the CHEMOTHERAPY ALERT CARD or IMMUNOTHERAPY ALERT CARD at check-in to the Emergency  Department and triage nurse.  Should you have questions after your visit or need to cancel or reschedule your appointment, please contact [facility] [phone]  and follow the prompts.  Office hours are 8:00 a.m. to 4:30 p.m. Monday - Friday. Please note that voicemails left after 4:00 p.m. may not be returned until the following business day.  We are closed weekends and major holidays. You have access to a nurse at all times for urgent questions. Please call the main number to the clinic [phone] and follow the prompts.  For any non-urgent questions, you may also contact your provider using MyChart. We now offer e-Visits for anyone 44 and older to request care online for non-urgent symptoms. For details visit [email or web address].   Also download the MyChart app! Go to the app store, search "MyChart", open the app, select [facility], and log in with your MyChart username and password.

## 2022-08-02 NOTE — Patient Instructions (Signed)
[facility]  Discharge Instructions  You were seen and examined today by Dr. Delton Coombes.  Dr. Delton Coombes discussed your most recent lab work which revealed that everything is good and stable.  Start taking Vitamin D 800 IU once daily.  Follow-up as scheduled in 6 months with labs.    Thank you for choosing [facility] to provide your oncology and hematology care.   To afford each patient quality time with our provider, please arrive at least 15 minutes before your scheduled appointment time. You may need to reschedule your appointment if you arrive late (10 or more minutes). Arriving late affects you and other patients whose appointments are after yours.  Also, if you miss three or more appointments without notifying the office, you may be dismissed from the clinic at the provider's discretion.    Again, thank you for choosing [facility].  Our hope is that these requests will decrease the amount of time that you wait before being seen by our physicians.   If you have a lab appointment with the [facility] please come in thru the Main Entrance and check in at the main information desk.           _____________________________________________________________  Should you have questions after your visit to [facility], please contact our office at [phone] and follow the prompts.  Our office hours are 8:00 a.m. to 4:30 p.m. Monday - Thursday and 8:00 a.m. to 2:30 p.m. Friday.  Please note that voicemails left after 4:00 p.m. may not be returned until the following business day.  We are closed weekends and all major holidays.  You do have access to a nurse 24-7, just call the main number to the clinic [phone] and do not press any options, hold on the line and a nurse will answer the phone.    For prescription refill requests, have your pharmacy contact our office and allow 72 hours.    Masks are  optional in the cancer centers. If you would like for your care team to wear a mask while they are taking care of you, please let them know. You may have one support person who is at least 42 years old accompany you for your appointments.

## 2022-08-02 NOTE — Progress Notes (Signed)
Patient presents today for Prolia injection per providers order.  Vital signs WNL.  Calcium noted to be 8.8, labs last on 07/30/22.  Patient has been taking Calcium/vitamin D supplements, has had no prior or upcoming dental work, and no jaw pain.  Message received from [facility] RN/Dr. Delton Coombes patient okay for Prolia injection.  Stable during administration without incident; injection site WNL; see MAR for injection details.  Patient tolerated procedure well and without incident.  No questions or complaints noted at this time.

## 2022-08-02 NOTE — Progress Notes (Signed)
Patient has been assessed, vital signs and labs have been reviewed by Dr. Katragadda. ANC, Creatinine, LFTs, and Platelets are within treatment parameters per Dr. Katragadda. The patient is good to proceed with treatment at this time. Primary RN and pharmacy aware.  

## 2022-08-06 ENCOUNTER — Other Ambulatory Visit (HOSPITAL_COMMUNITY)
Admission: RE | Admit: 2022-08-06 | Discharge: 2022-08-06 | Disposition: A | Discharge status: Home / Self Care | Payer: PPO | Source: Ambulatory Visit | Attending: Hematology | Admitting: Hematology

## 2022-08-06 DIAGNOSIS — C50511 Malignant neoplasm of lower-outer quadrant of right female breast: Secondary | ICD-10-CM | POA: Diagnosis not present

## 2022-08-06 DIAGNOSIS — Z170 Estrogen receptor positive status [ER+]: Secondary | ICD-10-CM | POA: Insufficient documentation

## 2022-08-06 DIAGNOSIS — Z79811 Long term (current) use of aromatase inhibitors: Secondary | ICD-10-CM | POA: Diagnosis not present

## 2022-08-06 LAB — COMPREHENSIVE METABOLIC PANEL
ALT: 17 U/L (ref 0–44)
AST: 19 U/L (ref 15–41)
Albumin: 4 g/dL (ref 3.5–5.0)
Alkaline Phosphatase: 47 U/L (ref 38–126)
Anion gap: 8 (ref 5–15)
BUN: 14 mg/dL (ref 6–20)
CO2: 26 mmol/L (ref 22–32)
Calcium: 8.6 mg/dL — ABNORMAL LOW (ref 8.9–10.3)
Chloride: 105 mmol/L (ref 98–111)
Creatinine, Ser: 0.64 mg/dL (ref 0.44–1.00)
GFR, Estimated: >60 mL/min (ref >60)
Glucose, Bld: 91 mg/dL (ref 70–99)
Potassium: 4.2 mmol/L (ref 3.5–5.1)
Sodium: 139 mmol/L (ref 135–145)
Total Bilirubin: 0.7 mg/dL (ref 0.3–1.2)
Total Protein: 7.2 g/dL (ref 6.5–8.1)

## 2022-08-06 LAB — CBC WITH DIFFERENTIAL/PLATELET
Abs Immature Granulocytes: 0.01 10*3/uL (ref 0.00–0.07)
Basophils Absolute: 0 10*3/uL (ref 0.0–0.1)
Basophils Relative: 0 %
Eosinophils Absolute: 0.1 10*3/uL (ref 0.0–0.5)
Eosinophils Relative: 2 %
HCT: 38.8 % (ref 36.0–46.0)
Hemoglobin: 12.7 g/dL (ref 12.0–15.0)
Immature Granulocytes: 0 %
Lymphocytes Relative: 39 %
Lymphs Abs: 1.8 10*3/uL (ref 0.7–4.0)
MCH: 29.6 pg (ref 26.0–34.0)
MCHC: 32.7 g/dL (ref 30.0–36.0)
MCV: 90.4 fL (ref 80.0–100.0)
Monocytes Absolute: 0.4 10*3/uL (ref 0.1–1.0)
Monocytes Relative: 8 %
Neutro Abs: 2.3 10*3/uL (ref 1.7–7.7)
Neutrophils Relative %: 51 %
Platelets: 157 10*3/uL (ref 150–400)
RBC: 4.29 MIL/uL (ref 3.87–5.11)
RDW: 13.2 % (ref 11.5–15.5)
WBC: 4.5 10*3/uL (ref 4.0–10.5)
nRBC: 0 % (ref 0.0–0.2)

## 2022-08-08 LAB — CANCER ANTIGEN 15-3: CA 15-3: 17.1 U/mL (ref 0.0–25.0)

## 2022-08-08 LAB — MISC LABCORP TEST (SEND OUT): Labcorp test code: 81950

## 2022-08-09 ENCOUNTER — Ambulatory Visit (HOSPITAL_COMMUNITY)
Admission: RE | Admit: 2022-08-09 | Discharge: 2022-08-09 | Disposition: A | Discharge status: Home / Self Care | Payer: PPO | Source: Ambulatory Visit | Attending: Hematology | Admitting: Hematology

## 2022-08-09 DIAGNOSIS — C50511 Malignant neoplasm of lower-outer quadrant of right female breast: Secondary | ICD-10-CM | POA: Diagnosis not present

## 2022-08-09 DIAGNOSIS — Z170 Estrogen receptor positive status [ER+]: Secondary | ICD-10-CM | POA: Insufficient documentation

## 2022-08-09 DIAGNOSIS — Z136 Encounter for screening for cardiovascular disorders: Secondary | ICD-10-CM | POA: Diagnosis not present

## 2022-08-09 MED ORDER — GADOBUTROL 1 MMOL/ML IV SOLN
10.0000 mL | Freq: Once | INTRAVENOUS | Status: AC | PRN
  Administered 2022-08-09: 10 mL via INTRAVENOUS

## 2022-08-10 ENCOUNTER — Other Ambulatory Visit: Payer: Self-pay | Admitting: Hematology

## 2022-08-10 DIAGNOSIS — R928 Other abnormal and inconclusive findings on diagnostic imaging of breast: Secondary | ICD-10-CM

## 2022-08-20 ENCOUNTER — Telehealth: Payer: Self-pay | Admitting: *Deleted

## 2022-08-20 ENCOUNTER — Emergency Department (HOSPITAL_COMMUNITY): Payer: PPO

## 2022-08-20 ENCOUNTER — Other Ambulatory Visit: Payer: Self-pay

## 2022-08-20 ENCOUNTER — Inpatient Hospital Stay (HOSPITAL_COMMUNITY)
Admission: EM | Admit: 2022-08-20 | Discharge: 2022-08-22 | DRG: 603 | Disposition: A | Discharge status: Home / Self Care | Payer: PPO | Attending: Internal Medicine | Admitting: Internal Medicine

## 2022-08-20 ENCOUNTER — Encounter (HOSPITAL_COMMUNITY): Payer: Self-pay | Admitting: Emergency Medicine

## 2022-08-20 DIAGNOSIS — M8580 Other specified disorders of bone density and structure, unspecified site: Secondary | ICD-10-CM | POA: Diagnosis not present

## 2022-08-20 DIAGNOSIS — Z888 Allergy status to other drugs, medicaments and biological substances status: Secondary | ICD-10-CM | POA: Diagnosis not present

## 2022-08-20 DIAGNOSIS — Z91040 Latex allergy status: Secondary | ICD-10-CM

## 2022-08-20 DIAGNOSIS — F419 Anxiety disorder, unspecified: Secondary | ICD-10-CM | POA: Diagnosis not present

## 2022-08-20 DIAGNOSIS — L03113 Cellulitis of right upper limb: Secondary | ICD-10-CM | POA: Diagnosis not present

## 2022-08-20 DIAGNOSIS — Z8042 Family history of malignant neoplasm of prostate: Secondary | ICD-10-CM

## 2022-08-20 DIAGNOSIS — Z1589 Genetic susceptibility to other disease: Secondary | ICD-10-CM | POA: Diagnosis present

## 2022-08-20 DIAGNOSIS — Z8249 Family history of ischemic heart disease and other diseases of the circulatory system: Secondary | ICD-10-CM

## 2022-08-20 DIAGNOSIS — Z8041 Family history of malignant neoplasm of ovary: Secondary | ICD-10-CM

## 2022-08-20 DIAGNOSIS — G629 Polyneuropathy, unspecified: Secondary | ICD-10-CM | POA: Diagnosis not present

## 2022-08-20 DIAGNOSIS — Z803 Family history of malignant neoplasm of breast: Secondary | ICD-10-CM | POA: Diagnosis not present

## 2022-08-20 DIAGNOSIS — Z91048 Other nonmedicinal substance allergy status: Secondary | ICD-10-CM | POA: Diagnosis not present

## 2022-08-20 DIAGNOSIS — E876 Hypokalemia: Secondary | ICD-10-CM | POA: Diagnosis present

## 2022-08-20 DIAGNOSIS — F32A Depression, unspecified: Secondary | ICD-10-CM | POA: Diagnosis not present

## 2022-08-20 DIAGNOSIS — E871 Hypo-osmolality and hyponatremia: Secondary | ICD-10-CM | POA: Diagnosis not present

## 2022-08-20 DIAGNOSIS — L0390 Cellulitis, unspecified: Secondary | ICD-10-CM | POA: Diagnosis present

## 2022-08-20 DIAGNOSIS — R000 Tachycardia, unspecified: Secondary | ICD-10-CM | POA: Diagnosis not present

## 2022-08-20 DIAGNOSIS — Z1152 Encounter for screening for COVID-19: Secondary | ICD-10-CM

## 2022-08-20 DIAGNOSIS — Z853 Personal history of malignant neoplasm of breast: Secondary | ICD-10-CM

## 2022-08-20 DIAGNOSIS — Z28310 Unvaccinated for covid-19: Secondary | ICD-10-CM | POA: Diagnosis not present

## 2022-08-20 DIAGNOSIS — M7989 Other specified soft tissue disorders: Secondary | ICD-10-CM | POA: Diagnosis not present

## 2022-08-20 DIAGNOSIS — Z6841 Body Mass Index (BMI) 40.0 and over, adult: Secondary | ICD-10-CM | POA: Diagnosis not present

## 2022-08-20 DIAGNOSIS — Z86718 Personal history of other venous thrombosis and embolism: Secondary | ICD-10-CM

## 2022-08-20 DIAGNOSIS — Z79899 Other long term (current) drug therapy: Secondary | ICD-10-CM | POA: Diagnosis not present

## 2022-08-20 DIAGNOSIS — R509 Fever, unspecified: Secondary | ICD-10-CM | POA: Diagnosis not present

## 2022-08-20 DIAGNOSIS — Z1501 Genetic susceptibility to malignant neoplasm of breast: Secondary | ICD-10-CM

## 2022-08-20 LAB — CBC WITH DIFFERENTIAL/PLATELET
Abs Immature Granulocytes: 0.05 10*3/uL (ref 0.00–0.07)
Basophils Absolute: 0 10*3/uL (ref 0.0–0.1)
Basophils Relative: 0 %
Eosinophils Absolute: 0 10*3/uL (ref 0.0–0.5)
Eosinophils Relative: 0 %
HCT: 36.4 % (ref 36.0–46.0)
Hemoglobin: 12.4 g/dL (ref 12.0–15.0)
Immature Granulocytes: 1 %
Lymphocytes Relative: 8 %
Lymphs Abs: 0.8 10*3/uL (ref 0.7–4.0)
MCH: 29.7 pg (ref 26.0–34.0)
MCHC: 34.1 g/dL (ref 30.0–36.0)
MCV: 87.1 fL (ref 80.0–100.0)
Monocytes Absolute: 0.5 10*3/uL (ref 0.1–1.0)
Monocytes Relative: 5 %
Neutro Abs: 8.6 10*3/uL — ABNORMAL HIGH (ref 1.7–7.7)
Neutrophils Relative %: 86 %
Platelets: 128 10*3/uL — ABNORMAL LOW (ref 150–400)
RBC: 4.18 MIL/uL (ref 3.87–5.11)
RDW: 13.3 % (ref 11.5–15.5)
WBC: 9.9 10*3/uL (ref 4.0–10.5)
nRBC: 0 % (ref 0.0–0.2)

## 2022-08-20 LAB — COMPREHENSIVE METABOLIC PANEL
ALT: 16 U/L (ref 0–44)
AST: 17 U/L (ref 15–41)
Albumin: 4.1 g/dL (ref 3.5–5.0)
Alkaline Phosphatase: 42 U/L (ref 38–126)
Anion gap: 10 (ref 5–15)
BUN: 11 mg/dL (ref 6–20)
CO2: 23 mmol/L (ref 22–32)
Calcium: 8.5 mg/dL — ABNORMAL LOW (ref 8.9–10.3)
Chloride: 99 mmol/L (ref 98–111)
Creatinine, Ser: 0.58 mg/dL (ref 0.44–1.00)
GFR, Estimated: >60 mL/min (ref >60)
Glucose, Bld: 109 mg/dL — ABNORMAL HIGH (ref 70–99)
Potassium: 3.5 mmol/L (ref 3.5–5.1)
Sodium: 132 mmol/L — ABNORMAL LOW (ref 135–145)
Total Bilirubin: 1.1 mg/dL (ref 0.3–1.2)
Total Protein: 7.6 g/dL (ref 6.5–8.1)

## 2022-08-20 LAB — HCG, SERUM, QUALITATIVE: Preg, Serum: NEGATIVE

## 2022-08-20 LAB — RESP PANEL BY RT-PCR (RSV, FLU A&B, COVID)  RVPGX2
Influenza A by PCR: NEGATIVE
Influenza B by PCR: NEGATIVE
Resp Syncytial Virus by PCR: NEGATIVE
SARS Coronavirus 2 by RT PCR: NEGATIVE

## 2022-08-20 LAB — LACTIC ACID, PLASMA: Lactic Acid, Venous: 0.8 mmol/L (ref 0.5–1.9)

## 2022-08-20 MED ORDER — OXYCODONE HCL 5 MG PO TABS
10.0000 mg | ORAL_TABLET | ORAL | Status: DC | PRN
  Administered 2022-08-21 – 2022-08-22 (×6): 10 mg via ORAL
  Filled 2022-08-20 (×6): qty 2

## 2022-08-20 MED ORDER — VANCOMYCIN HCL 1250 MG/250ML IV SOLN
1250.0000 mg | Freq: Two times a day (BID) | INTRAVENOUS | Status: DC
  Administered 2022-08-21 – 2022-08-22 (×3): 1250 mg via INTRAVENOUS
  Filled 2022-08-20 (×3): qty 250

## 2022-08-20 MED ORDER — SODIUM CHLORIDE 0.9 % IV SOLN
2.0000 g | Freq: Three times a day (TID) | INTRAVENOUS | Status: DC
  Administered 2022-08-20 – 2022-08-22 (×5): 2 g via INTRAVENOUS
  Filled 2022-08-20 (×5): qty 12.5

## 2022-08-20 MED ORDER — DULOXETINE HCL 30 MG PO CPEP
30.0000 mg | ORAL_CAPSULE | Freq: Every day | ORAL | Status: DC
  Filled 2022-08-20: qty 1

## 2022-08-20 MED ORDER — VANCOMYCIN HCL 1250 MG/250ML IV SOLN: 1250.0000 mg | Freq: Two times a day (BID) | INTRAVENOUS | Status: DC

## 2022-08-20 MED ORDER — VANCOMYCIN HCL IN DEXTROSE 1-5 GM/200ML-% IV SOLN
1000.0000 mg | Freq: Once | INTRAVENOUS | Status: AC
  Administered 2022-08-20: 1000 mg via INTRAVENOUS
  Filled 2022-08-20: qty 200

## 2022-08-20 MED ORDER — ACETAMINOPHEN 325 MG PO TABS
650.0000 mg | ORAL_TABLET | Freq: Four times a day (QID) | ORAL | Status: DC | PRN
  Administered 2022-08-20 – 2022-08-21 (×2): 650 mg via ORAL
  Filled 2022-08-20 (×2): qty 2

## 2022-08-20 MED ORDER — ANASTROZOLE 1 MG PO TABS
1.0000 mg | ORAL_TABLET | Freq: Every day | ORAL | Status: DC
  Filled 2022-08-20 (×2): qty 1

## 2022-08-20 MED ORDER — HEPARIN SODIUM (PORCINE) 5000 UNIT/ML IJ SOLN
5000.0000 [IU] | Freq: Three times a day (TID) | INTRAMUSCULAR | Status: DC
  Administered 2022-08-20 – 2022-08-22 (×5): 5000 [IU] via SUBCUTANEOUS
  Filled 2022-08-20 (×5): qty 1

## 2022-08-20 MED ORDER — ONDANSETRON HCL 4 MG/2ML IJ SOLN: 4.0000 mg | Freq: Four times a day (QID) | INTRAMUSCULAR | Status: DC | PRN

## 2022-08-20 MED ORDER — MORPHINE SULFATE (PF) 2 MG/ML IV SOLN: 2.0000 mg | INTRAVENOUS | Status: DC | PRN

## 2022-08-20 MED ORDER — DULOXETINE HCL 60 MG PO CPEP
60.0000 mg | ORAL_CAPSULE | Freq: Every day | ORAL | Status: DC
  Filled 2022-08-20: qty 1

## 2022-08-20 MED ORDER — GABAPENTIN 300 MG PO CAPS
600.0000 mg | ORAL_CAPSULE | Freq: Three times a day (TID) | ORAL | Status: DC
  Administered 2022-08-20 – 2022-08-21 (×3): 600 mg via ORAL
  Filled 2022-08-20 (×5): qty 2

## 2022-08-20 MED ORDER — DULOXETINE HCL 30 MG PO CPEP
90.0000 mg | ORAL_CAPSULE | Freq: Every day | ORAL | Status: DC
  Administered 2022-08-21 (×2): 90 mg via ORAL
  Filled 2022-08-20: qty 3

## 2022-08-20 MED ORDER — IOHEXOL 300 MG/ML  SOLN
75.0000 mL | Freq: Once | INTRAMUSCULAR | Status: AC | PRN
  Administered 2022-08-20: 75 mL via INTRAVENOUS

## 2022-08-20 MED ORDER — ANASTROZOLE 1 MG PO TABS
1.0000 mg | ORAL_TABLET | Freq: Every day | ORAL | Status: DC
  Administered 2022-08-21 (×2): 1 mg via ORAL
  Filled 2022-08-20 (×4): qty 1

## 2022-08-20 MED ORDER — ALPRAZOLAM 0.5 MG PO TABS
0.5000 mg | ORAL_TABLET | Freq: Every evening | ORAL | Status: DC | PRN
  Administered 2022-08-21: 0.5 mg via ORAL
  Filled 2022-08-20: qty 1

## 2022-08-20 MED ORDER — HYDROMORPHONE HCL 1 MG/ML IJ SOLN
1.0000 mg | Freq: Once | INTRAMUSCULAR | Status: AC
  Administered 2022-08-20: 1 mg via INTRAVENOUS
  Filled 2022-08-20: qty 1

## 2022-08-20 MED ORDER — DOCUSATE SODIUM 100 MG PO CAPS: 100.0000 mg | ORAL_CAPSULE | Freq: Every day | ORAL | Status: DC | PRN

## 2022-08-20 MED ORDER — PROMETHAZINE HCL 12.5 MG PO TABS: 12.5000 mg | ORAL_TABLET | ORAL | Status: DC | PRN

## 2022-08-20 MED ORDER — LACTATED RINGERS IV BOLUS
1000.0000 mL | Freq: Once | INTRAVENOUS | Status: AC
  Administered 2022-08-20: 1000 mL via INTRAVENOUS

## 2022-08-20 MED ORDER — ACETAMINOPHEN 650 MG RE SUPP: 650.0000 mg | Freq: Four times a day (QID) | RECTAL | Status: DC | PRN

## 2022-08-20 MED ORDER — SODIUM CHLORIDE 0.9 % IV SOLN
2.0000 g | Freq: Once | INTRAVENOUS | Status: AC
  Administered 2022-08-20: 2 g via INTRAVENOUS
  Filled 2022-08-20: qty 12.5

## 2022-08-20 MED ORDER — ONDANSETRON HCL 4 MG PO TABS: 4.0000 mg | ORAL_TABLET | Freq: Four times a day (QID) | ORAL | Status: DC | PRN

## 2022-08-20 NOTE — Consult Note (Signed)
Pharmacy Antibiotic Note  Carly [name] is a 42 y.o. female admitted on 08/20/2022 with  right upper arm erythema .  Pharmacy has been consulted for Vancomycin and Cefepime dosing.  Patient's erythema of her right arm started around 8 am on 2/26, and started spreading to her chest as well as distally. Also was found to have a fever and has a previous history of cellulitis requiring IV vancomycin.  Plan: 1) Patient received Vancomycin 1g IV in the ED. Will order an additional 1g to be given to equal 2g total as the loading dose  -Maintenance dose: Vancomycin 1250 mg IV Q 12 hrs. Goal AUC 400-550. Expected AUC: 511.8 Expected Cmin: 14.5 SCr used: 0.8(actual 0.55), Vd used: 0.5  2) Cefepime 2g IV Q 8 hours  Will continue to monitor renal function and cultures and adjust antibiotics/dose accordingly.   Height: '5\' 7"'$  (170.2 cm) Weight: 124.7 kg (275 lb) IBW/kg (Calculated) : 61.6  Temp (24hrs), Avg:99.7 F (37.6 C), Min:99.7 F (37.6 C), Max:99.7 F (37.6 C)  Recent Labs  Lab 08/20/22 1508  WBC 9.9  CREATININE 0.58  LATICACIDVEN 0.8    Estimated Creatinine Clearance: 125.5 mL/min (by C-G formula based on SCr of 0.58 mg/dL).    Allergies  Allergen Reactions   Latex Rash   Other Rash    Oozing blistery rash   Sumatriptan Other (See Comments)    Other reaction(s): Unknown Numbness of face and arms   Tape Rash    Oozing blistery rash    Antimicrobials this admission: Vancomycin 2/26 >>  Cefepime 2/26 >>   Dose adjustments this admission: N/A  Microbiology results: 2/26 BCx: pending  Thank you for allowing pharmacy to be a part of this patient's care.  Pearla Dubonnet 08/20/2022 8:37 PM

## 2022-08-20 NOTE — ED Provider Notes (Signed)
[facility] Provider Note   CSN: [id number] Arrival date & time: 08/20/22  1224     History {Add pertinent medical, surgical, social history, OB history to HPI:1} Chief Complaint  Patient presents with   Arm Injury   Wound Infection    Carly [name] is a 42 y.o. female.   Arm Injury      Home Medications Prior to Admission medications   Medication Sig Start Date End Date Taking? Authorizing Provider  ALPRAZolam Duanne Moron) 0.5 MG tablet TAKE ONE TABLET BY MOUTH AT BEDTIME AS NEEDED FOR ANXIETY 04/26/22   Derek Jack, MD  anastrozole (ARIMIDEX) 1 MG tablet TAKE ONE TABLET BY MOUTH ONCE DAILY 06/26/22   Derek Jack, MD  ascorbic acid (VITAMIN C) 100 MG tablet Take 100 mg by mouth daily.    [provider]  Calcium-Magnesium-Vitamin D (CALCIUM 500 PO) Take 1 tablet by mouth daily.    [provider]  cyanocobalamin (VITAMIN B12) 1000 MCG/ML injection INJECT 1ML INTO THE MUSCLE EVERY 30 DAYS 05/25/22   Derek Jack, MD  docusate sodium (COLACE) 100 MG capsule Take 100 mg by mouth daily as needed for mild constipation. 07/05/15   [provider]  DULoxetine (CYMBALTA) 30 MG capsule Take 30 mg by mouth daily.  02/27/17   [provider]  DULoxetine (CYMBALTA) 60 MG capsule Take 1 capsule (60 mg total) by mouth daily. 06/14/16   Holley Bouche, NP  gabapentin (NEURONTIN) 600 MG tablet Take 600 mg by mouth 3 (three) times daily. 02/23/17   [provider]  HYDROcodone-acetaminophen (NORCO) 10-325 MG tablet Take 1 tablet by mouth every 4 (four) hours as needed. Patient taking differently: Take 1 tablet by mouth every 4 (four) hours as needed for moderate pain. 11/16/16   Holley Bouche, NP  Misc. Devices MISC Please provide patient with a compression therapy pump fot her Right arm lymphedema. 04/26/17   Twana First, MD  Multiple Vitamins-Minerals (MULTIVITAMIN ADULT PO) Take  1 tablet by mouth daily.    [provider]  ondansetron (ZOFRAN) 4 MG tablet Take 1 tablet (4 mg total) by mouth every 6 (six) hours as needed. for nausea 03/07/21   Roxan Hockey, MD  phentermine (ADIPEX-P) 37.5 MG tablet Take 37.5 mg by mouth daily. 11/30/19   [provider]  prochlorperazine (COMPAZINE) 10 MG tablet Take 10 mg by mouth as needed.    [provider]  promethazine (PHENERGAN) 12.5 MG tablet Take 1 tablet (12.5 mg total) by mouth every 4 (four) hours as needed for nausea or vomiting. 03/07/21   Roxan Hockey, MD  valACYclovir (VALTREX) 1000 MG tablet Take 2,000 mg by mouth 2 (two) times daily. 06/02/21   [provider]      Allergies    Latex, Other, Sumatriptan, and Tape    Review of Systems   Review of Systems  Physical Exam Updated Vital Signs BP 124/87   Pulse (!) 124   Temp 99.7 F (37.6 C) (Oral)   Resp 18   SpO2 99%  Physical Exam    ED Results / Procedures / Treatments   Labs (all labs ordered are listed, but only abnormal results are displayed) Labs Reviewed  CULTURE, BLOOD (ROUTINE X 2)  CULTURE, BLOOD (ROUTINE X 2)  RESP PANEL BY RT-PCR (RSV, FLU A&B, COVID)  RVPGX2  LACTIC ACID, PLASMA  LACTIC ACID, PLASMA  COMPREHENSIVE METABOLIC PANEL  CBC WITH DIFFERENTIAL/PLATELET  POC URINE PREG, ED  EKG None  Radiology No results found.  Procedures Procedures  {Document cardiac monitor, telemetry assessment procedure when appropriate:1}  Medications Ordered in ED Medications - No data to display  ED Course/ Medical Decision Making/ A&P   {   Click here for ABCD2, HEART and other calculatorsREFRESH Note before signing :1}                          Medical Decision Making Amount and/or Complexity of Data Reviewed Labs: ordered. Radiology: ordered.   ***  {Document critical care time when appropriate:1} {Document review of labs and clinical decision tools ie heart score, Chads2Vasc2 etc:1}   {Document your independent review of radiology images, and any outside records:1} {Document your discussion with family members, caretakers, and with consultants:1} {Document social determinants of health affecting pt's care:1} {Document your decision making why or why not admission, treatments were needed:1} Final Clinical Impression(s) / ED Diagnoses Final diagnoses:  None    Rx / DC Orders ED Discharge Orders     None

## 2022-08-20 NOTE — H&P (Addendum)
History and Physical    Patient: Carly [name] [id number] DOB: [birth year] DOA: 08/20/2022 DOS: the patient was seen and examined on 08/20/2022 PCP: [name], MD  Patient coming from: Home  Chief Complaint:  Chief Complaint  Patient presents with   Arm Injury   Wound Infection   HPI: Carly [name] is a 42 y.o. female with medical history significant of anxiety, breast cancer, neuropathy, DVT in 2013, multiple episodes of cellulitis in the right upper extremity, presents the ED with a chief complaint of redness in her right upper extremity.  Patient reports that her arm was normal when she went to bed last night.  At 8:30 AM she woke up out of sleep due to pain in her right arm.  She describes it as a heaviness and a sharp pain.  She noticed that it was red, "splotchy", and hot.  She reports its gotten worse throughout the day.  Initially it was only red between her shoulder and her elbow, but now the redness goes all the way down her arm.  She denies any bug bites, injuries.  She is not diabetic.  She reports she had cellulitis to lower times in the same extremity in the last year.  She reports she been treated with IV vancomycin by Dr. Raliegh Ip in the past.  She denies fever, but reports she has had chills.  She has had a normal appetite.  She denies any other infectious symptoms such as dysuria, diarrhea, cough.  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  Patient reports she has had a DVT in the past in 2013.  She is no longer on anticoagulation.  Patient has no other complaints at this time.  Patient does not smoke, does not drink, does not use illicit drugs.  She is not vaccinated for COVID.  Patient is full code. Review of Systems: As mentioned in the history of present illness. All other systems reviewed and are negative. 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 She has never used smokeless tobacco. She reports current alcohol use. She reports that she does not use drugs.  Allergies  Allergen Reactions   Latex Rash   Other Rash    Oozing blistery rash   Sumatriptan Other (See Comments)    Other reaction(s): Unknown Numbness of face and arms   Tape Rash    Oozing blistery rash    Family History  Problem Relation Age of Onset   Breast cancer Mother 16   BRCA 1/2 Mother    Prostate cancer Father        metastatic   Breast cancer Sister 21       BRCA1 mutation   Breast cancer Maternal Aunt        dx in her 58s; BRCA1 mutation   Heart attack Maternal Grandfather    Ovarian cancer Paternal Grandmother        dx in her 56s   Heart attack Paternal Grandfather    Healthy Sister    BRCA 1/2 Maternal Aunt  Healthy Maternal Aunt        negative for a BRCA mutation   Healthy Maternal Aunt        negative for a BRCA mutation    Prior to Admission medications   Medication Sig Start Date End Date Taking? Authorizing Provider  ALPRAZolam (XANAX) 0.5 MG tablet TAKE ONE TABLET BY MOUTH AT BEDTIME AS NEEDED FOR ANXIETY Patient taking differently: Take 0.5 mg by mouth at bedtime as needed for anxiety or sleep. 04/26/22  Yes Derek Jack, MD  anastrozole (ARIMIDEX) 1 MG tablet TAKE ONE TABLET BY MOUTH ONCE DAILY Patient taking differently: Take 1 mg by mouth daily. 06/26/22  Yes Derek Jack, MD  ascorbic acid (VITAMIN C) 100 MG tablet Take 100 mg by mouth daily.   Yes [provider]  Calcium-Magnesium-Vitamin D (CALCIUM 500 PO) Take 1 tablet by mouth daily.   Yes [provider]  cyanocobalamin (VITAMIN B12) 1000 MCG/ML injection INJECT 1ML INTO THE MUSCLE EVERY 30  DAYS Patient taking differently: Inject 100 mcg into the muscle every 30 (thirty) days. 05/25/22  Yes Derek Jack, MD  docusate sodium (COLACE) 100 MG capsule Take 100 mg by mouth daily as needed for mild constipation. 07/05/15  Yes [provider]  DULoxetine (CYMBALTA) 30 MG capsule Take 30 mg by mouth daily.  02/27/17  Yes [provider]  DULoxetine (CYMBALTA) 60 MG capsule Take 1 capsule (60 mg total) by mouth daily. 06/14/16  Yes Holley Bouche, NP  gabapentin (NEURONTIN) 600 MG tablet Take 600 mg by mouth at bedtime. 02/23/17  Yes [provider]  HYDROcodone-acetaminophen (NORCO) 10-325 MG tablet Take 1 tablet by mouth every 4 (four) hours as needed. Patient taking differently: Take 1 tablet by mouth every 4 (four) hours as needed for moderate pain. 11/16/16  Yes Holley Bouche, NP  Multiple Vitamins-Minerals (MULTIVITAMIN ADULT PO) Take 1 tablet by mouth daily.   Yes [provider]  promethazine (PHENERGAN) 12.5 MG tablet Take 1 tablet (12.5 mg total) by mouth every 4 (four) hours as needed for nausea or vomiting. 03/07/21  Yes Emokpae, Courage, MD  valACYclovir (VALTREX) 1000 MG tablet Take 2,000 mg by mouth 2 (two) times daily as needed (cold sores). 06/02/21  Yes [provider]  [place]. Devices MISC Please provide patient with a compression therapy pump fot her Right arm lymphedema. 04/26/17   Twana First, MD    Physical Exam: Vitals:   08/20/22 1334 08/20/22 1645 08/20/22 1711  BP: 124/87 127/89   Pulse: (!) 124 (!) 104   Resp: 18 16   Temp: 99.7 F (37.6 C)    TempSrc: Oral    SpO2: 99% 97%   Weight:   124.7 kg  Height:   '5\' 7"'$  (1.702 m)   1.  General: Patient lying supine in bed,  no acute distress   2. Psychiatric: Somnolent after Dilaudid and oriented x 3, mood and behavior normal for situation, pleasant and cooperative with exam   3. Neurologic: Speech and language are normal, face is symmetric, moves all 4  extremities voluntarily, at baseline without acute deficits on limited exam   4. HEENMT:  Head is atraumatic, normocephalic, pupils reactive to light, neck is supple, trachea is midline, mucous membranes are moist   5. Respiratory : Lungs are clear to auscultation bilaterally without wheezing, rhonchi, rales, no cyanosis, no increase in work of breathing or accessory muscle use   6. Cardiovascular : Heart rate normal, rhythm is regular, no murmurs, rubs  or gallops, no peripheral edema, peripheral pulses palpated   7. Gastrointestinal:  Abdomen is soft, nondistended, nontender to palpation bowel sounds active, no masses or organomegaly palpated   8. Skin:  Right upper extremity is erythematous, edematous, blanchable, tender to palpation, the densest erythema on the dorsal and medial aspect of her arm between her elbow and her shoulder   9.Musculoskeletal:  No acute deformities or trauma, no asymmetry in tone, no peripheral edema, peripheral pulses palpated, no tenderness to palpation in the extremities  Data Reviewed: In the ED Temp 99.7, heart rate 124, respiratory rate 18, blood pressure 124/87, satting at 99% No leukocytosis Chemistries unremarkable Respiratory panel pending Blood culture pending Chest x-ray shows no active disease EKG shows sinus tachycardia with a heart rate of 107, QTc 459 Cefepime and vancomycin started in the ED Dilaudid given for pain in the ED 1 L normal saline also given Admission requested for cellulitis CT also done that shows no gas, and changes consistent with cellulitis  Assessment and Plan: * Cellulitis - Right upper extremity erythema, edema, pain - CT right upper extremity is consistent with cellulitis - Patient is tachycardic at presentation, temp 99.7, respiratory rate 18, no leukocytosis - Blood cultures pending - Patient started on vancomycin and cefepime in the ED - Continue vancomycin and cefepime - Continue pain control - Continue  to monitor  Anxiety and depression - Continue Xanax continue Cymbalta  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 Observation status is judged to be reasonable and necessary in order to provide the required intensity of service to ensure the patient's safety. The patient's presenting symptoms, physical exam findings, and initial radiographic and laboratory data in the context of their medical condition is felt to place them at decreased risk for further clinical deterioration. Furthermore, it is anticipated that the patient will be medically stable for discharge from the hospital within 2 midnights of admission.   Author: Rolla Plate, DO 08/20/2022 8:49 PM  For on call review [email or web address].

## 2022-08-20 NOTE — Telephone Encounter (Signed)
Received call from patient to advise that right axillary is very red and hot to the touch.  She has a biopsy scheduled in the near future. She also admits to running a fever.  Per Dr. Delton Coombes, patient was instructed to go to the ER.  Verbalized understanding.

## 2022-08-20 NOTE — Assessment & Plan Note (Signed)
-   Right upper extremity erythema, edema, pain - CT right upper extremity is consistent with cellulitis - Patient is tachycardic at presentation, temp 99.7, respiratory rate 18, no leukocytosis - Blood cultures pending - Patient started on vancomycin and cefepime in the ED - Continue vancomycin and cefepime - Continue pain control - Continue to monitor

## 2022-08-20 NOTE — ED Provider Notes (Incomplete Revision)
[facility] Provider Note   CSN: [id number] Arrival date & time: 08/20/22  1224     History  Chief Complaint  Patient presents with   Arm Injury   Wound Infection    Carly [name] is a 42 y.o. female.   Arm Injury    Patient is a 42 year old female status postchemotherapy and double mastectomy cancer free x 1-1/2 years presenting to the emergency department due to right upper extremity erythema.  Started acutely this morning at 8 AM, started in the right upper arm and has been spreading into her chest as well as distally.  She had a fever as well, it is very painful.  Denies any chest pain or shortness of breath.  History of previous cellulitis requiring admission for IV vancomycin and this feels identical.  She denies any recent injuries or wounds to the arm, she did have a blood draw there a few days ago.  Home Medications Prior to Admission medications   Medication Sig Start Date End Date Taking? Authorizing Provider  ALPRAZolam Duanne Moron) 0.5 MG tablet TAKE ONE TABLET BY MOUTH AT BEDTIME AS NEEDED FOR ANXIETY 04/26/22   Derek Jack, MD  anastrozole (ARIMIDEX) 1 MG tablet TAKE ONE TABLET BY MOUTH ONCE DAILY 06/26/22   Derek Jack, MD  ascorbic acid (VITAMIN C) 100 MG tablet Take 100 mg by mouth daily.    [provider]  Calcium-Magnesium-Vitamin D (CALCIUM 500 PO) Take 1 tablet by mouth daily.    [provider]  cyanocobalamin (VITAMIN B12) 1000 MCG/ML injection INJECT 1ML INTO THE MUSCLE EVERY 30 DAYS 05/25/22   Derek Jack, MD  docusate sodium (COLACE) 100 MG capsule Take 100 mg by mouth daily as needed for mild constipation. 07/05/15   [provider]  DULoxetine (CYMBALTA) 30 MG capsule Take 30 mg by mouth daily.  02/27/17   [provider]  DULoxetine (CYMBALTA) 60 MG capsule Take 1 capsule (60 mg total) by mouth daily. 06/14/16   Holley Bouche, NP  gabapentin  (NEURONTIN) 600 MG tablet Take 600 mg by mouth 3 (three) times daily. 02/23/17   [provider]  HYDROcodone-acetaminophen (NORCO) 10-325 MG tablet Take 1 tablet by mouth every 4 (four) hours as needed. Patient taking differently: Take 1 tablet by mouth every 4 (four) hours as needed for moderate pain. 11/16/16   Holley Bouche, NP  Misc. Devices MISC Please provide patient with a compression therapy pump fot her Right arm lymphedema. 04/26/17   Twana First, MD  Multiple Vitamins-Minerals (MULTIVITAMIN ADULT PO) Take 1 tablet by mouth daily.    [provider]  ondansetron (ZOFRAN) 4 MG tablet Take 1 tablet (4 mg total) by mouth every 6 (six) hours as needed. for nausea 03/07/21   Roxan Hockey, MD  phentermine (ADIPEX-P) 37.5 MG tablet Take 37.5 mg by mouth daily. 11/30/19   [provider]  prochlorperazine (COMPAZINE) 10 MG tablet Take 10 mg by mouth as needed.    [provider]  promethazine (PHENERGAN) 12.5 MG tablet Take 1 tablet (12.5 mg total) by mouth every 4 (four) hours as needed for nausea or vomiting. 03/07/21   Roxan Hockey, MD  valACYclovir (VALTREX) 1000 MG tablet Take 2,000 mg by mouth 2 (two) times daily. 06/02/21   [provider]      Allergies    Latex, Other, Sumatriptan, and Tape    Review of Systems   Review of Systems  Physical Exam Updated Vital  Signs BP 127/89   Pulse (!) 104   Temp 99.7 F (37.6 C) (Oral)   Resp 16   Ht '5\' 7"'$  (1.702 m)   Wt 124.7 kg   SpO2 97%   BMI 43.07 kg/m  Physical Exam Vitals and nursing note reviewed. Exam conducted with a chaperone present.  Constitutional:      Appearance: Normal appearance.  HENT:     Head: Normocephalic and atraumatic.  Eyes:     General: No scleral icterus.       Right eye: No discharge.        Left eye: No discharge.     Extraocular Movements: Extraocular movements intact.     Pupils: Pupils are equal, round, and reactive to light.  Cardiovascular:      Rate and Rhythm: Normal rate and regular rhythm.     Pulses: Normal pulses.     Heart sounds: Normal heart sounds. No murmur heard.    No friction rub. No gallop.  Pulmonary:     Effort: Pulmonary effort is normal. No respiratory distress.     Breath sounds: Normal breath sounds.  Abdominal:     General: Abdomen is flat. Bowel sounds are normal. There is no distension.     Palpations: Abdomen is soft.     Tenderness: There is no abdominal tenderness.  Musculoskeletal:        General: Tenderness present.  Skin:    General: Skin is warm and dry.     Capillary Refill: Capillary refill takes less than 2 seconds.     Coloration: Skin is not jaundiced.     Findings: Erythema present.  Neurological:     Mental Status: She is alert. Mental status is at baseline.     Coordination: Coordination normal.     ED Results / Procedures / Treatments   Labs (all labs ordered are listed, but only abnormal results are displayed) Labs Reviewed  COMPREHENSIVE METABOLIC PANEL - Abnormal; Notable for the following components:      Result Value   Sodium 132 (*)    Glucose, Bld 109 (*)    Calcium 8.5 (*)    All other components within normal limits  CBC WITH DIFFERENTIAL/PLATELET - Abnormal; Notable for the following components:   Platelets 128 (*)    Neutro Abs 8.6 (*)    All other components within normal limits  RESP PANEL BY RT-PCR (RSV, FLU A&B, COVID)  RVPGX2  CULTURE, BLOOD (ROUTINE X 2)  CULTURE, BLOOD (ROUTINE X 2)  LACTIC ACID, PLASMA  HCG, SERUM, QUALITATIVE  POC URINE PREG, ED    EKG EKG Interpretation  Date/Time:  Monday August 20 2022 15:57:37 EST Ventricular Rate:  107 PR Interval:  147 QRS Duration: 93 QT Interval:  344 QTC Calculation: 459 R Axis:   70 Text Interpretation: Sinus tachycardia ECG OTHERWISE WITHIN NORMAL LIMITS Confirmed by Noemi Chapel [phone]) on 08/20/2022 4:08:50 PM  Radiology DG Chest Portable 1 View  Result Date: 08/20/2022 CLINICAL DATA:   Fever. EXAM: PORTABLE CHEST 1 VIEW COMPARISON:  March 05, 2021. FINDINGS: The heart size and mediastinal contours are within normal limits. Both lungs are clear. Left internal jugular Port-A-Cath is unchanged. The visualized skeletal structures are unremarkable. IMPRESSION: No active disease. Electronically Signed   By: Marijo Conception M.D.   On: 08/20/2022 14:47    Procedures Procedures    Medications Ordered in ED Medications  lactated ringers bolus 1,000 mL (1,000 mLs Intravenous New Bag/Given 08/20/22 1514)  vancomycin (VANCOCIN)  IVPB 1000 mg/200 mL premix (0 mg Intravenous Stopped 08/20/22 1706)  ceFEPIme (MAXIPIME) 2 g in sodium chloride 0.9 % 100 mL IVPB (0 g Intravenous Stopped 08/20/22 1706)  HYDROmorphone (DILAUDID) injection 1 mg (1 mg Intravenous Given 08/20/22 1704)  iohexol (OMNIPAQUE) 300 MG/ML solution 75 mL (75 mLs Intravenous Contrast Given 08/20/22 1822)    ED Course/ Medical Decision Making/ A&P Clinical Course as of 08/20/22 1856  Mon Aug 20, 2022  1649 I consulted the hospitalist Dr. Clearence Ped, she is requesting CT of the upper extremity then reconsult  [HS]    Clinical Course User Index [HS] Sherrill Raring, PA-C                             Medical Decision Making Amount and/or Complexity of Data Reviewed Labs: ordered. Radiology: ordered.  Risk Prescription drug management. Decision regarding hospitalization.   Patient presents to the emergency department due to cellulitis to right upper extremity.  She is neurovascularly intact with brisk cap refill, radial pulse 2+  I am concerned about cellulitis, she meets SIRS criteria so sepsis protocol was initiated.  Will empirically treat with vancomycin and cefepime and fluids.  Patient does not have leukocytosis, not anemic.  Lactic is actually within normal limits.  While in the ED the erythema has actually spread to her back as well, I do not think she would be a good candidate for outpatient antibiotic trial.   I will consult hospitalist service for admission.    I consulted hospitalist service requested CT of the upper arm which I have ordered, will reconsult once CT results return.     Final Clinical Impression(s) / ED Diagnoses Final diagnoses:  Cellulitis of right upper extremity    Rx / DC Orders ED Discharge Orders     None         Sherrill Raring, [place] 08/20/22 1856

## 2022-08-20 NOTE — Assessment & Plan Note (Signed)
-   Patient still undergoing yearly surveillance with MRI of the breast -Tolerating anastrozole, continue

## 2022-08-20 NOTE — ED Provider Notes (Cosign Needed Addendum)
[facility] EMERGENCY DEPARTMENT AT [facility] Provider Note   CSN: [id number] Arrival date & time: 08/20/22  1224     History  Chief Complaint  Patient presents with   Arm Injury   Wound Infection    Carly [name] is a 42 y.o. female.   Arm Injury    Patient is a 42 year old female status postchemotherapy and double mastectomy cancer free x 1-1/2 years presenting to the emergency department due to right upper extremity erythema.  Started acutely this morning at 8 AM, started in the right upper arm and has been spreading into her chest as well as distally.  Carly [name] had a fever as well, it is very painful.  Denies any chest pain or shortness of breath.  History of previous cellulitis requiring admission for IV vancomycin and this feels identical.  Carly [name] denies any recent injuries or wounds to the arm, Carly [name] did have a blood draw there a few days ago.  Home Medications Prior to Admission medications   Medication Sig Start Date End Date Taking? Authorizing Provider  ALPRAZolam Prudy Feeler) 0.5 MG tablet TAKE ONE TABLET BY MOUTH AT BEDTIME AS NEEDED FOR ANXIETY 04/26/22   Doreatha Massed, MD  anastrozole (ARIMIDEX) 1 MG tablet TAKE ONE TABLET BY MOUTH ONCE DAILY 06/26/22   Doreatha Massed, MD  ascorbic acid (VITAMIN C) 100 MG tablet Take 100 mg by mouth daily.    [provider]  Calcium-Magnesium-Vitamin D (CALCIUM 500 PO) Take 1 tablet by mouth daily.    [provider]  cyanocobalamin (VITAMIN B12) 1000 MCG/ML injection INJECT INTO THE MUSCLE EVERY 30 DAYS 05/25/22   Doreatha Massed, MD  docusate sodium (COLACE) 100 MG capsule Take 100 mg by mouth daily as needed for mild constipation. 07/05/15   [provider]  DULoxetine (CYMBALTA) 30 MG capsule Take 30 mg by mouth daily.  02/27/17   [provider]  DULoxetine (CYMBALTA) 60 MG capsule Take 1 capsule (60 mg total) by mouth daily. 06/14/16   Hubbard Hartshorn, NP  gabapentin  (NEURONTIN) 600 MG tablet Take 600 mg by mouth 3 (three) times daily. 02/23/17   [provider]  HYDROcodone-acetaminophen (NORCO) 10-325 MG tablet Take 1 tablet by mouth every 4 (four) hours as needed. Patient taking differently: Take 1 tablet by mouth every 4 (four) hours as needed for moderate pain. 11/16/16   Hubbard Hartshorn, NP  Misc. Devices MISC Please provide patient with a compression therapy pump fot her Right arm lymphedema. 04/26/17   Ralene Cork, MD  Multiple Vitamins-Minerals (MULTIVITAMIN ADULT PO) Take 1 tablet by mouth daily.    [provider]  ondansetron (ZOFRAN) 4 MG tablet Take 1 tablet (4 mg total) by mouth every 6 (six) hours as needed. for nausea 03/07/21   Shon Hale, MD  phentermine (ADIPEX-P) 37.5 MG tablet Take 37.5 mg by mouth daily. 11/30/19   [provider]  prochlorperazine (COMPAZINE) 10 MG tablet Take 10 mg by mouth as needed.    [provider]  promethazine (PHENERGAN) 12.5 MG tablet Take 1 tablet (12.5 mg total) by mouth every 4 (four) hours as needed for nausea or vomiting. 03/07/21   Shon Hale, MD  valACYclovir (VALTREX) 1000 MG tablet Take 2,000 mg by mouth 2 (two) times daily. 06/02/21   [provider]      Allergies    Latex, Other, Sumatriptan, and Tape    Review of Systems   Review of Systems  Physical Exam Updated Vital  Signs BP 127/89   Pulse (!) 104   Temp 99.7 F (37.6 C) (Oral)   Resp 16   Ht 5\' 7"  (1.702 m)   Wt 124.7 kg   SpO2 97%   BMI 43.07 kg/m  Physical Exam Vitals and nursing note reviewed. Exam conducted with a chaperone present.  Constitutional:      Appearance: Normal appearance.  HENT:     Head: Normocephalic and atraumatic.  Eyes:     General: No scleral icterus.       Right eye: No discharge.        Left eye: No discharge.     Extraocular Movements: Extraocular movements intact.     Pupils: Pupils are equal, round, and reactive to light.  Cardiovascular:      Rate and Rhythm: Normal rate and regular rhythm.     Pulses: Normal pulses.     Heart sounds: Normal heart sounds. No murmur heard.    No friction rub. No gallop.  Pulmonary:     Effort: Pulmonary effort is normal. No respiratory distress.     Breath sounds: Normal breath sounds.  Abdominal:     General: Abdomen is flat. Bowel sounds are normal. There is no distension.     Palpations: Abdomen is soft.     Tenderness: There is no abdominal tenderness.  Musculoskeletal:        General: Tenderness present.  Skin:    General: Skin is warm and dry.     Capillary Refill: Capillary refill takes less than 2 seconds.     Coloration: Skin is not jaundiced.     Findings: Erythema present.  Neurological:     Mental Status: Carly [name] is alert. Mental status is at baseline.     Coordination: Coordination normal.     ED Results / Procedures / Treatments   Labs (all labs ordered are listed, but only abnormal results are displayed) Labs Reviewed  COMPREHENSIVE METABOLIC PANEL - Abnormal; Notable for the following components:      Result Value   Sodium 132 (*)    Glucose, Bld 109 (*)    Calcium 8.5 (*)    All other components within normal limits  CBC WITH DIFFERENTIAL/PLATELET - Abnormal; Notable for the following components:   Platelets 128 (*)    Neutro Abs 8.6 (*)    All other components within normal limits  RESP PANEL BY RT-PCR (RSV, FLU A&B, COVID)  RVPGX2  CULTURE, BLOOD (ROUTINE X 2)  CULTURE, BLOOD (ROUTINE X 2)  LACTIC ACID, PLASMA  HCG, SERUM, QUALITATIVE  POC URINE PREG, ED    EKG EKG Interpretation  Date/Time:  Monday August 20 2022 15:57:37 EST Ventricular Rate:  107 PR Interval:  147 QRS Duration: 93 QT Interval:  344 QTC Calculation: 459 R Axis:   70 Text Interpretation: Sinus tachycardia ECG OTHERWISE WITHIN NORMAL LIMITS Confirmed by Eber Hong ([id number]) on 08/20/2022 4:08:50 PM  Radiology DG Chest Portable 1 View  Result Date: 08/20/2022 CLINICAL DATA:   Fever. EXAM: PORTABLE CHEST 1 VIEW COMPARISON:  March 05, 2021. FINDINGS: The heart size and mediastinal contours are within normal limits. Both lungs are clear. Left internal jugular Port-A-Cath is unchanged. The visualized skeletal structures are unremarkable. IMPRESSION: No active disease. Electronically Signed   By: Lupita Raider M.D.   On: 08/20/2022 14:47    Procedures Procedures    Medications Ordered in ED Medications  lactated ringers bolus 1,000 mL (1,000 mLs Intravenous New Bag/Given 08/20/22 1514)  vancomycin (VANCOCIN)  IVPB 1000 mg/200 mL premix (0 mg Intravenous Stopped 08/20/22 1706)  ceFEPIme (MAXIPIME) 2 g in sodium chloride 0.9 % 100 mL IVPB (0 g Intravenous Stopped 08/20/22 1706)  HYDROmorphone (DILAUDID) injection 1 mg (1 mg Intravenous Given 08/20/22 1704)  iohexol (OMNIPAQUE) 300 MG/ML solution 75 mL (75 mLs Intravenous Contrast Given 08/20/22 1822)    ED Course/ Medical Decision Making/ A&P Clinical Course as of 08/20/22 1856  Mon Aug 20, 2022  1649 I consulted the hospitalist Dr. Carren Rang, Carly [name] is requesting CT of the upper extremity then reconsult  [HS]    Clinical Course User Index [HS] Theron Arista, PA-C                             Medical Decision Making Amount and/or Complexity of Data Reviewed Labs: ordered. Radiology: ordered.  Risk Prescription drug management. Decision regarding hospitalization.   Patient presents to the emergency department due to cellulitis to right upper extremity.  Carly [name] is neurovascularly intact with brisk cap refill, radial pulse 2+  I am concerned about cellulitis, Carly [name] meets SIRS criteria so sepsis protocol was initiated.  Will empirically treat with vancomycin and cefepime and fluids.  Patient does not have leukocytosis, not anemic.  Lactic is actually within normal limits.  While in the ED the erythema has actually spread to her back as well, I do not think Carly [name] would be a good candidate for outpatient antibiotic trial.   I will consult hospitalist service for admission.    I consulted hospitalist service requested CT of the upper arm which I have ordered, will reconsult once CT results return.     Final Clinical Impression(s) / ED Diagnoses Final diagnoses:  Cellulitis of right upper extremity    Rx / DC Orders ED Discharge Orders     None         Theron Arista, PA-C 08/20/22 1856    Theron Arista, PA-C 08/21/22 1945    Eber Hong, MD 08/22/22 [phone]

## 2022-08-20 NOTE — ED Triage Notes (Signed)
Pt reports she was told to come for arm swelling for IV vancy. Left arm swollen and reddened. Pt has hx of double mastectomy. Previous hospitalization for this.

## 2022-08-20 NOTE — Assessment & Plan Note (Signed)
-   Continue Xanax continue Cymbalta

## 2022-08-21 ENCOUNTER — Encounter (HOSPITAL_COMMUNITY): Payer: Self-pay | Admitting: Hematology

## 2022-08-21 DIAGNOSIS — Z28310 Unvaccinated for covid-19: Secondary | ICD-10-CM | POA: Diagnosis not present

## 2022-08-21 DIAGNOSIS — G629 Polyneuropathy, unspecified: Secondary | ICD-10-CM | POA: Diagnosis present

## 2022-08-21 DIAGNOSIS — Z8249 Family history of ischemic heart disease and other diseases of the circulatory system: Secondary | ICD-10-CM | POA: Diagnosis not present

## 2022-08-21 DIAGNOSIS — Z803 Family history of malignant neoplasm of breast: Secondary | ICD-10-CM | POA: Diagnosis not present

## 2022-08-21 DIAGNOSIS — E876 Hypokalemia: Secondary | ICD-10-CM | POA: Diagnosis present

## 2022-08-21 DIAGNOSIS — Z91048 Other nonmedicinal substance allergy status: Secondary | ICD-10-CM | POA: Diagnosis not present

## 2022-08-21 DIAGNOSIS — Z8042 Family history of malignant neoplasm of prostate: Secondary | ICD-10-CM | POA: Diagnosis not present

## 2022-08-21 DIAGNOSIS — L03113 Cellulitis of right upper limb: Secondary | ICD-10-CM | POA: Diagnosis present

## 2022-08-21 DIAGNOSIS — Z1501 Genetic susceptibility to malignant neoplasm of breast: Secondary | ICD-10-CM | POA: Diagnosis not present

## 2022-08-21 DIAGNOSIS — M8580 Other specified disorders of bone density and structure, unspecified site: Secondary | ICD-10-CM | POA: Diagnosis present

## 2022-08-21 DIAGNOSIS — Z888 Allergy status to other drugs, medicaments and biological substances status: Secondary | ICD-10-CM | POA: Diagnosis not present

## 2022-08-21 DIAGNOSIS — Z86718 Personal history of other venous thrombosis and embolism: Secondary | ICD-10-CM | POA: Diagnosis not present

## 2022-08-21 DIAGNOSIS — E871 Hypo-osmolality and hyponatremia: Secondary | ICD-10-CM | POA: Diagnosis present

## 2022-08-21 DIAGNOSIS — Z1152 Encounter for screening for COVID-19: Secondary | ICD-10-CM | POA: Diagnosis not present

## 2022-08-21 DIAGNOSIS — Z91040 Latex allergy status: Secondary | ICD-10-CM | POA: Diagnosis not present

## 2022-08-21 DIAGNOSIS — F32A Depression, unspecified: Secondary | ICD-10-CM | POA: Diagnosis present

## 2022-08-21 DIAGNOSIS — Z853 Personal history of malignant neoplasm of breast: Secondary | ICD-10-CM | POA: Diagnosis not present

## 2022-08-21 DIAGNOSIS — Z8041 Family history of malignant neoplasm of ovary: Secondary | ICD-10-CM | POA: Diagnosis not present

## 2022-08-21 DIAGNOSIS — Z6841 Body Mass Index (BMI) 40.0 and over, adult: Secondary | ICD-10-CM | POA: Diagnosis not present

## 2022-08-21 DIAGNOSIS — F419 Anxiety disorder, unspecified: Secondary | ICD-10-CM | POA: Diagnosis present

## 2022-08-21 DIAGNOSIS — Z79899 Other long term (current) drug therapy: Secondary | ICD-10-CM | POA: Diagnosis not present

## 2022-08-21 LAB — COMPREHENSIVE METABOLIC PANEL
ALT: 13 U/L (ref 0–44)
AST: 13 U/L — ABNORMAL LOW (ref 15–41)
Albumin: 3.4 g/dL — ABNORMAL LOW (ref 3.5–5.0)
Alkaline Phosphatase: 33 U/L — ABNORMAL LOW (ref 38–126)
Anion gap: 11 (ref 5–15)
BUN: 10 mg/dL (ref 6–20)
CO2: 24 mmol/L (ref 22–32)
Calcium: 8.2 mg/dL — ABNORMAL LOW (ref 8.9–10.3)
Chloride: 100 mmol/L (ref 98–111)
Creatinine, Ser: 0.63 mg/dL (ref 0.44–1.00)
GFR, Estimated: >60 mL/min (ref >60)
Glucose, Bld: 119 mg/dL — ABNORMAL HIGH (ref 70–99)
Potassium: 3 mmol/L — ABNORMAL LOW (ref 3.5–5.1)
Sodium: 135 mmol/L (ref 135–145)
Total Bilirubin: 1 mg/dL (ref 0.3–1.2)
Total Protein: 6.7 g/dL (ref 6.5–8.1)

## 2022-08-21 LAB — CBC WITH DIFFERENTIAL/PLATELET
Abs Immature Granulocytes: 0.07 10*3/uL (ref 0.00–0.07)
Basophils Absolute: 0 10*3/uL (ref 0.0–0.1)
Basophils Relative: 0 %
Eosinophils Absolute: 0 10*3/uL (ref 0.0–0.5)
Eosinophils Relative: 0 %
HCT: 33.5 % — ABNORMAL LOW (ref 36.0–46.0)
Hemoglobin: 11.3 g/dL — ABNORMAL LOW (ref 12.0–15.0)
Immature Granulocytes: 1 %
Lymphocytes Relative: 11 %
Lymphs Abs: 1.2 10*3/uL (ref 0.7–4.0)
MCH: 29.8 pg (ref 26.0–34.0)
MCHC: 33.7 g/dL (ref 30.0–36.0)
MCV: 88.4 fL (ref 80.0–100.0)
Monocytes Absolute: 0.7 10*3/uL (ref 0.1–1.0)
Monocytes Relative: 7 %
Neutro Abs: 8.3 10*3/uL — ABNORMAL HIGH (ref 1.7–7.7)
Neutrophils Relative %: 81 %
Platelets: 132 10*3/uL — ABNORMAL LOW (ref 150–400)
RBC: 3.79 MIL/uL — ABNORMAL LOW (ref 3.87–5.11)
RDW: 13.2 % (ref 11.5–15.5)
WBC: 10.2 10*3/uL (ref 4.0–10.5)
nRBC: 0 % (ref 0.0–0.2)

## 2022-08-21 LAB — MAGNESIUM: Magnesium: 1.8 mg/dL (ref 1.7–2.4)

## 2022-08-21 LAB — HIV ANTIBODY (ROUTINE TESTING W REFLEX): HIV Screen 4th Generation wRfx: NONREACTIVE

## 2022-08-21 MED ORDER — MAGNESIUM SULFATE 2 GM/50ML IV SOLN
2.0000 g | Freq: Once | INTRAVENOUS | Status: AC
  Administered 2022-08-21: 2 g via INTRAVENOUS
  Filled 2022-08-21: qty 50

## 2022-08-21 MED ORDER — POTASSIUM CHLORIDE CRYS ER 20 MEQ PO TBCR
30.0000 meq | EXTENDED_RELEASE_TABLET | ORAL | Status: AC
  Administered 2022-08-21 (×3): 30 meq via ORAL
  Filled 2022-08-21 (×3): qty 1

## 2022-08-21 NOTE — Progress Notes (Signed)
PROGRESS NOTE    LACRYSTAL URSERY  [id number] DOB: November 25, 1980 DOA: 08/20/2022 PCP: [name], MD    Brief Narrative:   Carly [name] is a 42 y.o. 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  She also endorses chills, but denies fever.  Has had multiple cellulitic infections to her right upper extremity treated previously by her oncologist Dr. Delton Coombes with IV vancomycin.  In the ED, temperature 99.7 F, HR 124, RR 18, BP 124/87, SpO2 99% on room air.  WBC 9.9, hemoglobin 12.4, platelets 128.  Sodium 132, potassium 3.5, chloride 99, CO2 23, glucose 109, BUN 11, creatinine 0.58.  AST is 17, ALT 16, total bilirubin 1.1.  Lactic acid 0.8.  COVID-19 influenza A/B/RSV PCR negative.  Chest x-ray with no active cardiopulmonary disease process.  CT right forearm/humerus with dorsal subcutaneous fat stranding throughout the proximal forearm/elbow consistent with cellulitis; no fluid collection or abscess noted, no bony abnormality appreciated.  Patient was started on IV vancomycin/cefepime.  EDP consulted [facility] for admission for further evaluation and management of recurrent right upper extremity cellulitis.  Assessment & Plan:   Right upper extremity cellulitis, recurrent Patient presenting to ED with pain, erythema to right upper extremity consistent with recurrent cellulitis.  Complicated by history of axillary dissection causing chronic lymphedema stemming from previous breast cancer treatment.  Has been previously treated with IV vancomycin per patient from her oncologist Dr. Delton Coombes.  Patient was elevated temperature 99.7, tachycardic without leukocytosis.  CT right  forearm/elbow consistent with cellulitis without fluid collection, abscess and no bony involvement. -- Blood cultures x 2: Pending -- Continue vancomycin, pharmacy consulted for dosing/monitoring -- Cefepime 2 g IV every 8 hours -- Pain control with oxycodone/morphine as needed -- If continues to improve, anticipate transitioning to Zyvox and Keflex/cefdinir in the next 1-2 days to complete antibiotic course outpatient  Hyponatremia: Resolved Likely secondary to hypovolemic hyponatremia in the setting of poor oral intake in days preceding hospitalization.  Sodium 132 on admission improved to 135 after IV fluid hydration. -- Continue to encourage increased oral intake  Anxiety/depression -- Cymbalta 90 mg p.o. daily -- Xanax 0.5 mg p.o. nightly as needed anxiety/sleep  BRCA1 positive breast cancer -- Anastrozole 1 mg p.o. nightly -- Continue outpatient follow-up with medical oncology, yearly surveillance with MR breast  Morbid obesity Body mass index is 43.07 kg/m.  Patient needs for aggressive lifestyle changes/weight loss as this complicates all facets of care.  Outpatient follow-up with PCP.     DVT prophylaxis: heparin injection 5,000 Units Start: 08/20/22 2200 SCDs Start: 08/20/22 1945    Code Status: Full Code Family Communication: Updated family present at bedside this morning  Disposition Plan:  Level of care: Med-Surg Status is: Inpatient Remains inpatient appropriate because: IV antibiotics, anticipate discharge home in 1-2 days if continues with improvement of cellulitis    Consultants:  none  Procedures:  none  Antimicrobials:  Vancomycin 2/26>> Cefepime 2/26>>   Subjective: Patient seen examined bedside, resting comfortably.  Sleeping but easy arousable.  Family present.  Reports her erythema has improved overnight with IV antibiotics.  Continues with pain on palpation to right upper extremity.  Remains afebrile.  Chills improved.  No other specific  complaints  or concerns at this time.  Denies headache, no dizziness, no visual changes, no chest pain, no shortness of breath, no abdominal pain, no fever/chills/night sweats, no nausea/vomiting/diarrhea.  No acute events overnight per nursing staff.  Objective: Vitals:   08/20/22 2156 08/21/22 0014 08/21/22 0428 08/21/22 0715  BP: 102/67 99/69 102/67 102/65  Pulse: 95 94 100 99  Resp: 16 20 (!) 22 20  Temp: 98.5 F (36.9 C) 98.6 F (37 C) 98.8 F (37.1 C) 98.3 F (36.8 C)  TempSrc: Oral Oral Oral   SpO2: 99% 97% 93% 93%  Weight:      Height:        Intake/Output Summary (Last 24 hours) at 08/21/2022 1144 Last data filed at 08/21/2022 0400 Gross per 24 hour  Intake 1396.14 ml  Output --  Net 1396.14 ml   Filed Weights   08/20/22 1711  Weight: 124.7 kg    Examination:  Physical Exam: GEN: NAD, alert and oriented x 3, obese HEENT: NCAT, PERRL, EOMI, sclera clear, MMM PULM: CTAB w/o wheezes/crackles, normal respiratory effort, on room air CV: RRR w/o M/G/R GI: abd soft, NTND, NABS, no R/G/M MSK: Noted right upper extremity peripheral edema which is chronic and unchanged, muscle strength globally intact 5/5 bilateral upper/lower extremities NEURO: CN II-XII intact, no focal deficits, sensation to light touch intact PSYCH: normal mood/affect Integumentary: Right upper extremity with edema that is chronic and unchanged, noted erythema with improvement, slight tenderness to palpation to cellulitic region    Data Reviewed: I have personally reviewed following labs and imaging studies  CBC: Recent Labs  Lab 08/20/22 1508 08/21/22 0410  WBC 9.9 10.2  NEUTROABS 8.6* 8.3*  HGB 12.4 11.3*  HCT 36.4 33.5*  MCV 87.1 88.4  PLT 128* [id number]*   Basic Metabolic Panel: Recent Labs  Lab 08/20/22 1508 08/21/22 0410  NA 132* 135  K 3.5 3.0*  CL 99 100  CO2 23 24  GLUCOSE 109* 119*  BUN 11 10  CREATININE 0.58 0.63  CALCIUM 8.5* 8.2*  MG  --  1.8   GFR: Estimated  Creatinine Clearance: 125.5 mL/min (by C-G formula based on SCr of 0.63 mg/dL). Liver Function Tests: Recent Labs  Lab 08/20/22 1508 08/21/22 0410  AST 17 13*  ALT 16 13  ALKPHOS 42 33*  BILITOT 1.1 1.0  PROT 7.6 6.7  ALBUMIN 4.1 3.4*   No results for input(s): "LIPASE", "AMYLASE" in the last 168 hours. No results for input(s): "AMMONIA" in the last 168 hours. Coagulation Profile: No results for input(s): "INR", "PROTIME" in the last 168 hours. Cardiac Enzymes: No results for input(s): "CKTOTAL", "CKMB", "CKMBINDEX", "TROPONINI" in the last 168 hours. BNP (last 3 results) No results for input(s): "PROBNP" in the last 8760 hours. HbA1C: No results for input(s): "HGBA1C" in the last 72 hours. CBG: No results for input(s): "GLUCAP" in the last 168 hours. Lipid Profile: No results for input(s): "CHOL", "HDL", "LDLCALC", "TRIG", "CHOLHDL", "LDLDIRECT" in the last 72 hours. Thyroid Function Tests: No results for input(s): "TSH", "T4TOTAL", "FREET4", "T3FREE", "THYROIDAB" in the last 72 hours. Anemia Panel: No results for input(s): "VITAMINB12", "FOLATE", "FERRITIN", "TIBC", "IRON", "RETICCTPCT" in the last 72 hours. Sepsis Labs: Recent Labs  Lab 08/20/22 1508  LATICACIDVEN 0.8    Recent Results (from the past 240 hour(s))  Blood culture (routine x 2)     Status: None (Preliminary result)   Collection Time: 08/20/22  3:08 PM   Specimen: BLOOD  Result Value Ref Range Status   Specimen  Description BLOOD LEFT ANTECUBITAL  Final   Special Requests   Final    BOTTLES DRAWN AEROBIC AND ANAEROBIC Blood Culture results may not be optimal due to an excessive volume of blood received in culture bottles   Culture   Final    NO GROWTH < 24 HOURS Performed at [facility], [address] [place], [place] [address]    Report Status PENDING  Incomplete  Blood culture (routine x 2)     Status: None (Preliminary result)   Collection Time: 08/20/22  3:08 PM   Specimen: BLOOD  Result  Value Ref Range Status   Specimen Description BLOOD LEFT ARM  Final   Special Requests   Final    BOTTLES DRAWN AEROBIC AND ANAEROBIC Blood Culture adequate volume   Culture   Final    NO GROWTH < 24 HOURS Performed at [facility], [address], [place], [place] [address]    Report Status PENDING  Incomplete  Resp panel by RT-PCR (RSV, Flu A&B, Covid) Anterior Nasal Swab     Status: None   Collection Time: 08/20/22  3:45 PM   Specimen: Anterior Nasal Swab  Result Value Ref Range Status   SARS Coronavirus 2 by RT PCR NEGATIVE NEGATIVE Final    Comment: (NOTE) SARS-CoV-2 target nucleic acids are NOT DETECTED.  The SARS-CoV-2 RNA is generally detectable in upper respiratory specimens during the acute phase of infection. The lowest concentration of SARS-CoV-2 viral copies this assay can detect is 138 copies/mL. A negative result does not preclude SARS-Cov-2 infection and should not be used as the sole basis for treatment or other patient management decisions. A negative result may occur with  improper specimen collection/handling, submission of specimen other than nasopharyngeal swab, presence of viral mutation(s) within the areas targeted by this assay, and inadequate number of viral copies(<138 copies/mL). A negative result must be combined with clinical observations, patient history, and epidemiological information. The expected result is Negative.  Fact Sheet for Patients:  EntrepreneurPulse.com.au  Fact Sheet for Healthcare Providers:  IncredibleEmployment.be  This test is no t yet approved or cleared by the [place] FDA and  has been authorized for detection and/or diagnosis of SARS-CoV-2 by FDA under an Emergency Use Authorization (EUA). This EUA will remain  in effect (meaning this test can be used) for the duration of the COVID-19 declaration under Section 564(b)(1) of the Act, 21 U.S.C.section 360bbb-3(b)(1), unless the  authorization is terminated  or revoked sooner.       Influenza A by PCR NEGATIVE NEGATIVE Final   Influenza B by PCR NEGATIVE NEGATIVE Final    Comment: (NOTE) The Xpert Xpress SARS-CoV-2/FLU/RSV plus assay is intended as an aid in the diagnosis of influenza from Nasopharyngeal swab specimens and should not be used as a sole basis for treatment. Nasal washings and aspirates are unacceptable for Xpert Xpress SARS-CoV-2/FLU/RSV testing.  Fact Sheet for Patients: EntrepreneurPulse.com.au  Fact Sheet for Healthcare Providers: IncredibleEmployment.be  This test is not yet approved or cleared by the [place] FDA and has been authorized for detection and/or diagnosis of SARS-CoV-2 by FDA under an Emergency Use Authorization (EUA). This EUA will remain in effect (meaning this test can be used) for the duration of the COVID-19 declaration under Section 564(b)(1) of the Act, 21 U.S.C. section 360bbb-3(b)(1), unless the authorization is terminated or revoked.     Resp Syncytial Virus by PCR NEGATIVE NEGATIVE Final    Comment: (NOTE) Fact Sheet for Patients: EntrepreneurPulse.com.au  Fact Sheet for  Healthcare Providers: IncredibleEmployment.be  This test is not yet approved or cleared by the [place] and has been authorized for detection and/or diagnosis of SARS-CoV-2 by FDA under an Emergency Use Authorization (EUA). This EUA will remain in effect (meaning this test can be used) for the duration of the COVID-19 declaration under Section 564(b)(1) of the Act, 21 U.S.C. section 360bbb-3(b)(1), unless the authorization is terminated or revoked.  Performed at [facility], [address] [place], [place] [address]          Radiology Studies: CT FOREARM RIGHT W CONTRAST  Result Date: 08/20/2022 CLINICAL DATA:  Swelling and erythema EXAM: CT OF THE UPPER RIGHT EXTREMITY WITH CONTRAST TECHNIQUE:  Multidetector CT imaging of the upper right extremity was performed according to the standard protocol following intravenous contrast administration. RADIATION DOSE REDUCTION: This exam was performed according to the departmental dose-optimization program which includes automated exposure control, adjustment of the mA and/or kV according to patient size and/or use of iterative reconstruction technique. CONTRAST:  61m OMNIPAQUE IOHEXOL 300 MG/ML  SOLN COMPARISON:  None Available. FINDINGS: Bones/Joint/Cartilage Prior plate and screw fixation across the distal radius. There are no acute or destructive bony lesions. Alignment is anatomic. Ligaments Suboptimally assessed by CT. Muscles and Tendons No acute findings. Soft tissues Subcutaneous fat stranding throughout the dorsum of the proximal forearm and elbow, which could reflect cellulitis. No fluid collection or abscess. Reconstructed images demonstrate no additional findings. IMPRESSION: 1. Dorsal subcutaneous fat stranding throughout the proximal forearm and elbow, most consistent with cellulitis. No fluid collection or abscess. 2. No acute bony abnormality. Electronically Signed   By: MRanda NgoM.D.   On: 08/20/2022 18:57   CT HUMERUS RIGHT W CONTRAST  Result Date: 08/20/2022 CLINICAL DATA:  Soft tissue mass, upper arm, deep, swelling, erythema EXAM: CT OF THE UPPER RIGHT EXTREMITY WITH CONTRAST TECHNIQUE: Multidetector CT imaging of the upper right extremity was performed according to the standard protocol following intravenous contrast administration. RADIATION DOSE REDUCTION: This exam was performed according to the departmental dose-optimization program which includes automated exposure control, adjustment of the mA and/or kV according to patient size and/or use of iterative reconstruction technique. CONTRAST:  7101mOMNIPAQUE IOHEXOL 300 MG/ML  SOLN COMPARISON:  None Available. FINDINGS: Bones/Joint/Cartilage There are no acute or destructive bony  lesions. Alignment is anatomic. Joint spaces are well preserved. Ligaments Suboptimally assessed by CT. Muscles and Tendons No acute findings. Soft tissues There is subcutaneous fat stranding throughout the dorsal aspect of the right upper arm, which could reflect cellulitis. No subcutaneous gas. No fluid collections. Reconstructed images demonstrate no additional findings. IMPRESSION: 1. Dorsal subcutaneous fat stranding throughout the right upper arm, most compatible with cellulitis. No fluid collection or abscess. 2. No acute bony abnormality. Electronically Signed   By: MiRanda Ngo.D.   On: 08/20/2022 18:54   DG Chest Portable 1 View  Result Date: 08/20/2022 CLINICAL DATA:  Fever. EXAM: PORTABLE CHEST 1 VIEW COMPARISON:  March 05, 2021. FINDINGS: The heart size and mediastinal contours are within normal limits. Both lungs are clear. Left internal jugular Port-A-Cath is unchanged. The visualized skeletal structures are unremarkable. IMPRESSION: No active disease. Electronically Signed   By: JaMarijo Conception.D.   On: 08/20/2022 14:47        Scheduled Meds:  anastrozole  1 mg Oral QHS   DULoxetine  90 mg Oral QHS   gabapentin  600 mg Oral TID   heparin  5,000 Units Subcutaneous Q8H  potassium chloride  30 mEq Oral Q3H   Continuous Infusions:  ceFEPime (MAXIPIME) IV 2 g (08/21/22 0832)   vancomycin 1,250 mg (08/21/22 1104)     LOS: 0 days    Time spent: 51 minutes spent on chart review, discussion with nursing staff, consultants, updating family and interview/physical exam; more than 50% of that time was spent in counseling and/or coordination of care.    Antone Summons J [place] DO [facility] Available via Epic secure chat 7am-7pm After these hours, please refer to coverage provider listed on [email or web address] 08/21/2022, 11:44 AM

## 2022-08-21 NOTE — Progress Notes (Signed)
Patient arrived to room 312 around 2156. Oriented patient to room and call bell. Patient c/o pain during this shift x2. PRN pain medications given as ordered. IV abx administered. No adverse reactions to abx noted at this time. Patient rested well. Mother at bedside.

## 2022-08-21 NOTE — TOC Progression Note (Signed)
  Transition of Care [facility]) Screening Note   Patient Details  Name: Carly [name] Date of Birth: [birth year]   Transition of Care [facility]) CM/SW Contact:    Shade Flood, LCSW Phone Number: 08/21/2022, 9:20 AM    Transition of Care Department [facility]) has reviewed patient and no TOC needs have been identified at this time. We will continue to monitor patient advancement through interdisciplinary progression rounds. If new patient transition needs arise, please place a TOC consult.

## 2022-08-22 ENCOUNTER — Encounter (HOSPITAL_COMMUNITY): Payer: Self-pay | Admitting: Hematology

## 2022-08-22 DIAGNOSIS — L03113 Cellulitis of right upper limb: Secondary | ICD-10-CM | POA: Diagnosis not present

## 2022-08-22 LAB — BASIC METABOLIC PANEL
Anion gap: 10 (ref 5–15)
BUN: 12 mg/dL (ref 6–20)
CO2: 20 mmol/L — ABNORMAL LOW (ref 22–32)
Calcium: 7.8 mg/dL — ABNORMAL LOW (ref 8.9–10.3)
Chloride: 105 mmol/L (ref 98–111)
Creatinine, Ser: 0.7 mg/dL (ref 0.44–1.00)
GFR, Estimated: >60 mL/min (ref >60)
Glucose, Bld: 114 mg/dL — ABNORMAL HIGH (ref 70–99)
Potassium: 3.3 mmol/L — ABNORMAL LOW (ref 3.5–5.1)
Sodium: 135 mmol/L (ref 135–145)

## 2022-08-22 LAB — CREATININE, SERUM
Creatinine, Ser: 0.69 mg/dL (ref 0.44–1.00)
GFR, Estimated: >60 mL/min (ref >60)

## 2022-08-22 MED ORDER — POTASSIUM CHLORIDE CRYS ER 20 MEQ PO TBCR
40.0000 meq | EXTENDED_RELEASE_TABLET | Freq: Once | ORAL | Status: AC
  Administered 2022-08-22: 40 meq via ORAL
  Filled 2022-08-22: qty 2

## 2022-08-22 MED ORDER — DOXYCYCLINE MONOHYDRATE 100 MG PO CAPS: 100.0000 mg | ORAL_CAPSULE | Freq: Two times a day (BID) | ORAL | 0 refills | Status: AC

## 2022-08-22 MED ORDER — CEPHALEXIN 500 MG PO CAPS: 500.0000 mg | ORAL_CAPSULE | Freq: Three times a day (TID) | ORAL | 0 refills | Status: AC

## 2022-08-22 MED ORDER — POTASSIUM CHLORIDE CRYS ER 20 MEQ PO TBCR: 40.0000 meq | EXTENDED_RELEASE_TABLET | Freq: Every day | ORAL | 0 refills | Status: AC

## 2022-08-22 NOTE — Discharge Summary (Signed)
Physician Discharge Summary  Carly [name] [id number] DOB: May 11, 1981 DOA: 08/20/2022  PCP: [name], MD  Admit date: 08/20/2022 Discharge date: 08/22/2022  Admitted From: Home Disposition:  Home  Discharge Condition:Stable CODE STATUS:FULL Diet recommendation: Regular   Brief/Interim Summary: Carly [name] is a 42 y.o. 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  She also endorses chills, but denies fever.  Has had multiple cellulitic infections to her right upper extremity treated previously by her oncologist Dr. Delton Coombes with IV vancomycin.  Presented she was  tachycardic, mildly febrile.  Found to have erythema of the right upper extremity consistent with recurrent cellulitis.  Started on vancomycin and cefepime.  Blood cultures have been sent, no growth till date.  Her cellulitic area looks much better today, with decreased redness, pain.  Medically stable for discharge home today with oral antibiotics, will continue for 5 days.   Following problems were addressed during the hospitalization:    Right upper extremity cellulitis, recurrent Patient presenting to ED with pain, erythema to right upper extremity consistent with recurrent cellulitis.  Complicated by history of axillary dissection causing chronic lymphedema stemming from previous breast cancer treatment.  Has been previously treated with IV vancomycin per patient from her oncologist Dr. Delton Coombes.  Patient was elevated temperature 99.7, tachycardic without leukocytosis.  CT right forearm/elbow consistent with cellulitis without fluid collection, abscess and no bony involvement. Blood cultures no growth  till date.  Will be discharged on 5 days course of oral antibiotics.  Cellulitis area much better today   hyponatremia: Resolved Likely secondary to hypovolemic hyponatremia in the setting of poor oral intake in days preceding hospitalization.  Sodium 132 on admission improved to 135 after IV fluid hydration.   Anxiety/depression -- Cymbalta 90 mg p.o. daily -- Xanax 0.5 mg p.o. nightly as needed anxiety/sleep   BRCA1 positive breast cancer -- Anastrozole 1 mg p.o. nightly -- Continue outpatient follow-up with medical oncology, yearly surveillance with MR breast   Morbid obesity Body mass index is 43.07 kg/m.  Patient needs for aggressive lifestyle changes/weight loss as this complicates all facets of care.  Outpatient follow-up with PCP.    Hypokalemia Supplemented with potassium   Discharge Diagnoses:  Principal Problem:   Cellulitis Active Problems:   BRCA1 positive   Anxiety and depression    Discharge Instructions  Discharge Instructions     Diet general   Complete by: As directed    Discharge instructions   Complete by: As directed    1)Please take prescribed medications as instructed 2)Follow up with your PCP in a week   Increase activity slowly   Complete by: As directed       Allergies as of 08/22/2022       Reactions   Latex Rash   Other Rash   Oozing blistery rash   Sumatriptan Other (See Comments)   Other reaction(s): Unknown Numbness of face and arms   Tape Rash   Oozing blistery rash        Medication List     TAKE these medications    ALPRAZolam 0.5 MG tablet Commonly known as: XANAX TAKE ONE TABLET BY MOUTH AT BEDTIME AS NEEDED FOR ANXIETY What changed: See the new instructions.  anastrozole 1 MG tablet Commonly known as: ARIMIDEX TAKE ONE TABLET BY MOUTH ONCE DAILY   ascorbic acid 100 MG tablet Commonly known as: VITAMIN C Take 100 mg by mouth daily.   CALCIUM 500 PO Take 1 tablet by mouth daily.   cephALEXin 500 MG  capsule Commonly known as: KEFLEX Take 1 capsule (500 mg total) by mouth 3 (three) times daily for 5 days.   cyanocobalamin 1000 MCG/ML injection Commonly known as: VITAMIN B12 INJECT 1ML INTO THE MUSCLE EVERY 30 DAYS What changed: See the new instructions.   docusate sodium 100 MG capsule Commonly known as: COLACE Take 100 mg by mouth daily as needed for mild constipation.   doxycycline 100 MG capsule Commonly known as: MONODOX Take 1 capsule (100 mg total) by mouth 2 (two) times daily for 5 days.   DULoxetine 60 MG capsule Commonly known as: CYMBALTA Take 1 capsule (60 mg total) by mouth daily.   DULoxetine 30 MG capsule Commonly known as: CYMBALTA Take 30 mg by mouth daily.   gabapentin 600 MG tablet Commonly known as: NEURONTIN Take 600 mg by mouth at bedtime.   HYDROcodone-acetaminophen 10-325 MG tablet Commonly known as: NORCO Take 1 tablet by mouth every 4 (four) hours as needed. What changed: reasons to take this   Misc. Devices Misc Please provide patient with a compression therapy pump fot her Right arm lymphedema.   MULTIVITAMIN ADULT PO Take 1 tablet by mouth daily.   potassium chloride SA 20 MEQ tablet Commonly known as: KLOR-CON M Take 2 tablets (40 mEq total) by mouth daily for 3 days. Start taking on: August 23, 2022   promethazine 12.5 MG tablet Commonly known as: PHENERGAN Take 1 tablet (12.5 mg total) by mouth every 4 (four) hours as needed for nausea or vomiting.   valACYclovir 1000 MG tablet Commonly known as: VALTREX Take 2,000 mg by mouth 2 (two) times daily as needed (cold sores).        Follow-up Information     [name], MD. Schedule an appointment as soon as possible for a visit in 1 week(s).   Specialty: Internal Medicine Contact information: [place] [phone] [id number] [phone]                Allergies  Allergen Reactions   Latex Rash   Other Rash    Oozing blistery rash   Sumatriptan  Other (See Comments)    Other reaction(s): Unknown Numbness of face and arms   Tape Rash    Oozing blistery rash    Consultations: None   Procedures/Studies: CT FOREARM RIGHT W CONTRAST  Result Date: 08/20/2022 CLINICAL DATA:  Swelling and erythema EXAM: CT OF THE UPPER RIGHT EXTREMITY WITH CONTRAST TECHNIQUE: Multidetector CT imaging of the upper right extremity was performed according to the standard protocol following intravenous contrast administration. RADIATION DOSE REDUCTION: This exam was performed according to the departmental dose-optimization program which includes automated exposure control, adjustment of the mA and/or kV according to patient size and/or use of iterative reconstruction technique. CONTRAST:  23m OMNIPAQUE IOHEXOL 300 MG/ML  SOLN COMPARISON:  None Available. FINDINGS: Bones/Joint/Cartilage Prior plate and screw fixation across the distal radius. There are no acute or destructive bony lesions. Alignment is anatomic. Ligaments Suboptimally assessed by CT. Muscles and Tendons No acute findings. Soft tissues Subcutaneous fat stranding throughout the dorsum of the proximal forearm and elbow, which could reflect cellulitis. No fluid collection or abscess. Reconstructed images demonstrate no additional findings.  IMPRESSION: 1. Dorsal subcutaneous fat stranding throughout the proximal forearm and elbow, most consistent with cellulitis. No fluid collection or abscess. 2. No acute bony abnormality. Electronically Signed   By: Randa Ngo M.D.   On: 08/20/2022 18:57   CT HUMERUS RIGHT W CONTRAST  Result Date: 08/20/2022 CLINICAL DATA:  Soft tissue mass, upper arm, deep, swelling, erythema EXAM: CT OF THE UPPER RIGHT EXTREMITY WITH CONTRAST TECHNIQUE: Multidetector CT imaging of the upper right extremity was performed according to the standard protocol following intravenous contrast administration. RADIATION DOSE REDUCTION: This exam was performed according to the departmental  dose-optimization program which includes automated exposure control, adjustment of the mA and/or kV according to patient size and/or use of iterative reconstruction technique. CONTRAST:  55m OMNIPAQUE IOHEXOL 300 MG/ML  SOLN COMPARISON:  None Available. FINDINGS: Bones/Joint/Cartilage There are no acute or destructive bony lesions. Alignment is anatomic. Joint spaces are well preserved. Ligaments Suboptimally assessed by CT. Muscles and Tendons No acute findings. Soft tissues There is subcutaneous fat stranding throughout the dorsal aspect of the right upper arm, which could reflect cellulitis. No subcutaneous gas. No fluid collections. Reconstructed images demonstrate no additional findings. IMPRESSION: 1. Dorsal subcutaneous fat stranding throughout the right upper arm, most compatible with cellulitis. No fluid collection or abscess. 2. No acute bony abnormality. Electronically Signed   By: MRanda NgoM.D.   On: 08/20/2022 18:54   DG Chest Portable 1 View  Result Date: 08/20/2022 CLINICAL DATA:  Fever. EXAM: PORTABLE CHEST 1 VIEW COMPARISON:  March 05, 2021. FINDINGS: The heart size and mediastinal contours are within normal limits. Both lungs are clear. Left internal jugular Port-A-Cath is unchanged. The visualized skeletal structures are unremarkable. IMPRESSION: No active disease. Electronically Signed   By: JMarijo ConceptionM.D.   On: 08/20/2022 14:47   MR BREAST BILATERAL W WO CONTRAST INC CAD  Result Date: 08/10/2022 CLINICAL DATA:  42year old female for high risk screening breast MRI. Patient is BRCA 1 gene positive. 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 EXAM: BILATERAL BREAST MRI WITH AND WITHOUT CONTRAST TECHNIQUE: Multiplanar, multisequence MR images of both breasts were obtained prior to and following the intravenous administration of 10 ml of Gadavist Three-dimensional MR images were rendered by post-processing of the original MR  data on an independent workstation. The three-dimensional MR images were interpreted, and findings are reported in the following complete MRI report for this study. Three dimensional images were evaluated at the independent interpreting workstation using the DynaCAD thin client. COMPARISON:  Prior remote mammograms and 01/28/2009 breast MR. FINDINGS: Breast composition: a. Almost entirely fat. Background parenchymal enhancement: Minimal Right breast: No suspicious mass or worrisome enhancement noted. Surgical changes and retropectoral implant noted. Left breast: A 0.6 cm area of non masslike enhancement is noted within the middle depth INNER LEFT breast (image 70: Series 16), and appears to be primarily composed of fat. No other mass or abnormal enhancement noted. Surgical changes and retropectoral implant noted. Lymph nodes: No abnormal appearing lymph nodes. Ancillary findings:  None. IMPRESSION: 1. 0.6 cm INNER LEFT breast non masslike enhancement which appears to be primarily composed of fat. This is indeterminate but may represent an area of fat necrosis. Targeted LEFT breast ultrasound recommended for further evaluation. 2. No other suspicious abnormalities within either breast. RECOMMENDATION: LEFT breast ultrasound with possible biopsy. BI-RADS CATEGORY  4: Suspicious. Electronically Signed   By: JMargarette CanadaM.D.   On: 08/10/2022 11:28  Subjective: Patient seen and examined at bedside today.  Hemodynamically stable for discharge.  Feels much better with decreased redness, pain of the right arm  Discharge Exam: Vitals:   08/21/22 2052 08/22/22 0853  BP: (!) 135/90 125/82  Pulse: (!) 102 83  Resp: 16 18  Temp: 99.2 F (37.3 C) 98.7 F (37.1 C)  SpO2: 98% 97%   Vitals:   08/21/22 0715 08/21/22 1232 08/21/22 2052 08/22/22 0853  BP: 102/65 112/76 (!) 135/90 125/82  Pulse: 99 99 (!) 102 83  Resp: '20  16 18  '$ Temp: 98.3 F (36.8 C) 98 F (36.7 C) 99.2 F (37.3 C) 98.7 F (37.1 C)   TempSrc:  Oral Oral Oral  SpO2: 93% 96% 98% 97%  Weight:      Height:        General: Pt is alert, awake, not in acute distress,obese Cardiovascular: RRR, S1/S2 +, no rubs, no gallops Respiratory: CTA bilaterally, no wheezing, no rhonchi Abdominal: Soft, NT, ND, bowel sounds + Extremities: Right upper extremity lymphedema, improving area of cellulitis on the right arm    The results of significant diagnostics from this hospitalization (including imaging, microbiology, ancillary and laboratory) are listed below for reference.     Microbiology: Recent Results (from the past 240 hour(s))  Blood culture (routine x 2)     Status: None (Preliminary result)   Collection Time: 08/20/22  3:08 PM   Specimen: BLOOD  Result Value Ref Range Status   Specimen Description BLOOD LEFT ANTECUBITAL  Final   Special Requests   Final    BOTTLES DRAWN AEROBIC AND ANAEROBIC Blood Culture results may not be optimal due to an excessive volume of blood received in culture bottles   Culture   Final    NO GROWTH < 24 HOURS Performed at [facility], [address], [place], [place] [address]    Report Status PENDING  Incomplete  Blood culture (routine x 2)     Status: None (Preliminary result)   Collection Time: 08/20/22  3:08 PM   Specimen: BLOOD  Result Value Ref Range Status   Specimen Description BLOOD LEFT ARM  Final   Special Requests   Final    BOTTLES DRAWN AEROBIC AND ANAEROBIC Blood Culture adequate volume   Culture   Final    NO GROWTH < 24 HOURS Performed at [facility], [address] [place], [place] [address]    Report Status PENDING  Incomplete  Resp panel by RT-PCR (RSV, Flu A&B, Covid) Anterior Nasal Swab     Status: None   Collection Time: 08/20/22  3:45 PM   Specimen: Anterior Nasal Swab  Result Value Ref Range Status   SARS Coronavirus 2 by RT PCR NEGATIVE NEGATIVE Final    Comment: (NOTE) SARS-CoV-2 target nucleic acids are NOT DETECTED.  The SARS-CoV-2 RNA is generally  detectable in upper respiratory specimens during the acute phase of infection. The lowest concentration of SARS-CoV-2 viral copies this assay can detect is 138 copies/mL. A negative result does not preclude SARS-Cov-2 infection and should not be used as the sole basis for treatment or other patient management decisions. A negative result may occur with  improper specimen collection/handling, submission of specimen other than nasopharyngeal swab, presence of viral mutation(s) within the areas targeted by this assay, and inadequate number of viral copies(<138 copies/mL). A negative result must be combined with clinical observations, patient history, and epidemiological information. The expected result is Negative.  Fact Sheet for Patients:  EntrepreneurPulse.com.au  Fact  Sheet for Healthcare Providers:  IncredibleEmployment.be  This test is no t yet approved or cleared by the [place] FDA and  has been authorized for detection and/or diagnosis of SARS-CoV-2 by FDA under an Emergency Use Authorization (EUA). This EUA will remain  in effect (meaning this test can be used) for the duration of the COVID-19 declaration under Section 564(b)(1) of the Act, 21 U.S.C.section 360bbb-3(b)(1), unless the authorization is terminated  or revoked sooner.       Influenza A by PCR NEGATIVE NEGATIVE Final   Influenza B by PCR NEGATIVE NEGATIVE Final    Comment: (NOTE) The Xpert Xpress SARS-CoV-2/FLU/RSV plus assay is intended as an aid in the diagnosis of influenza from Nasopharyngeal swab specimens and should not be used as a sole basis for treatment. Nasal washings and aspirates are unacceptable for Xpert Xpress SARS-CoV-2/FLU/RSV testing.  Fact Sheet for Patients: EntrepreneurPulse.com.au  Fact Sheet for Healthcare Providers: IncredibleEmployment.be  This test is not yet approved or cleared by the [place] FDA  and has been authorized for detection and/or diagnosis of SARS-CoV-2 by FDA under an Emergency Use Authorization (EUA). This EUA will remain in effect (meaning this test can be used) for the duration of the COVID-19 declaration under Section 564(b)(1) of the Act, 21 U.S.C. section 360bbb-3(b)(1), unless the authorization is terminated or revoked.     Resp Syncytial Virus by PCR NEGATIVE NEGATIVE Final    Comment: (NOTE) Fact Sheet for Patients: EntrepreneurPulse.com.au  Fact Sheet for Healthcare Providers: IncredibleEmployment.be  This test is not yet approved or cleared by the [place] FDA and has been authorized for detection and/or diagnosis of SARS-CoV-2 by FDA under an Emergency Use Authorization (EUA). This EUA will remain in effect (meaning this test can be used) for the duration of the COVID-19 declaration under Section 564(b)(1) of the Act, 21 U.S.C. section 360bbb-3(b)(1), unless the authorization is terminated or revoked.  Performed at [facility], [address], [place], [place] [address]      Labs: BNP (last 3 results) No results for input(s): "BNP" in the last 8760 hours. Basic Metabolic Panel: Recent Labs  Lab 08/20/22 1508 08/21/22 0410 08/22/22 0401 08/22/22 0736  NA 132* 135  --  135  K 3.5 3.0*  --  3.3*  CL 99 100  --  105  CO2 23 24  --  20*  GLUCOSE 109* 119*  --  114*  BUN 11 10  --  12  CREATININE 0.58 0.63 0.69 0.70  CALCIUM 8.5* 8.2*  --  7.8*  MG  --  1.8  --   --    Liver Function Tests: Recent Labs  Lab 08/20/22 1508 08/21/22 0410  AST 17 13*  ALT 16 13  ALKPHOS 42 33*  BILITOT 1.1 1.0  PROT 7.6 6.7  ALBUMIN 4.1 3.4*   No results for input(s): "LIPASE", "AMYLASE" in the last 168 hours. No results for input(s): "AMMONIA" in the last 168 hours. CBC: Recent Labs  Lab 08/20/22 1508 08/21/22 0410  WBC 9.9 10.2  NEUTROABS 8.6* 8.3*  HGB 12.4 11.3*  HCT 36.4 33.5*  MCV 87.1 88.4   PLT 128* 132*   Cardiac Enzymes: No results for input(s): "CKTOTAL", "CKMB", "CKMBINDEX", "TROPONINI" in the last 168 hours. BNP: Invalid input(s): "POCBNP" CBG: No results for input(s): "GLUCAP" in the last 168 hours. D-Dimer No results for input(s): "DDIMER" in the last 72 hours. Hgb A1c No results for input(s): "HGBA1C" in the last 72 hours. Lipid Profile No results  for input(s): "CHOL", "HDL", "LDLCALC", "TRIG", "CHOLHDL", "LDLDIRECT" in the last 72 hours. Thyroid function studies No results for input(s): "TSH", "T4TOTAL", "T3FREE", "THYROIDAB" in the last 72 hours.  Invalid input(s): "FREET3" Anemia work up No results for input(s): "VITAMINB12", "FOLATE", "FERRITIN", "TIBC", "IRON", "RETICCTPCT" in the last 72 hours. Urinalysis    Component Value Date/Time   COLORURINE YELLOW 03/05/2021 0318   APPEARANCEUR CLEAR 03/05/2021 0318   LABSPEC 1.015 03/05/2021 0318   PHURINE 8.5 (H) 03/05/2021 0318   GLUCOSEU NEGATIVE 03/05/2021 0318   HGBUR NEGATIVE 03/05/2021 0318   BILIRUBINUR NEGATIVE 03/05/2021 0318   KETONESUR NEGATIVE 03/05/2021 0318   PROTEINUR NEGATIVE 03/05/2021 0318   NITRITE NEGATIVE 03/05/2021 0318   LEUKOCYTESUR NEGATIVE 03/05/2021 0318   Sepsis Labs Recent Labs  Lab 08/20/22 1508 08/21/22 0410  WBC 9.9 10.2   Microbiology Recent Results (from the past 240 hour(s))  Blood culture (routine x 2)     Status: None (Preliminary result)   Collection Time: 08/20/22  3:08 PM   Specimen: BLOOD  Result Value Ref Range Status   Specimen Description BLOOD LEFT ANTECUBITAL  Final   Special Requests   Final    BOTTLES DRAWN AEROBIC AND ANAEROBIC Blood Culture results may not be optimal due to an excessive volume of blood received in culture bottles   Culture   Final    NO GROWTH < 24 HOURS Performed at [facility], [address], [place], [place] [address]    Report Status PENDING  Incomplete  Blood culture (routine x 2)     Status: None (Preliminary result)    Collection Time: 08/20/22  3:08 PM   Specimen: BLOOD  Result Value Ref Range Status   Specimen Description BLOOD LEFT ARM  Final   Special Requests   Final    BOTTLES DRAWN AEROBIC AND ANAEROBIC Blood Culture adequate volume   Culture   Final    NO GROWTH < 24 HOURS Performed at [facility], [address], [place], [place] [address]    Report Status PENDING  Incomplete  Resp panel by RT-PCR (RSV, Flu A&B, Covid) Anterior Nasal Swab     Status: None   Collection Time: 08/20/22  3:45 PM   Specimen: Anterior Nasal Swab  Result Value Ref Range Status   SARS Coronavirus 2 by RT PCR NEGATIVE NEGATIVE Final    Comment: (NOTE) SARS-CoV-2 target nucleic acids are NOT DETECTED.  The SARS-CoV-2 RNA is generally detectable in upper respiratory specimens during the acute phase of infection. The lowest concentration of SARS-CoV-2 viral copies this assay can detect is 138 copies/mL. A negative result does not preclude SARS-Cov-2 infection and should not be used as the sole basis for treatment or other patient management decisions. A negative result may occur with  improper specimen collection/handling, submission of specimen other than nasopharyngeal swab, presence of viral mutation(s) within the areas targeted by this assay, and inadequate number of viral copies(<138 copies/mL). A negative result must be combined with clinical observations, patient history, and epidemiological information. The expected result is Negative.  Fact Sheet for Patients:  EntrepreneurPulse.com.au  Fact Sheet for Healthcare Providers:  IncredibleEmployment.be  This test is no t yet approved or cleared by the [place] FDA and  has been authorized for detection and/or diagnosis of SARS-CoV-2 by FDA under an Emergency Use Authorization (EUA). This EUA will remain  in effect (meaning this test can be used) for the duration of the COVID-19 declaration under Section 564(b)(1)  of the Act, 21 U.S.C.section  360bbb-3(b)(1), unless the authorization is terminated  or revoked sooner.       Influenza A by PCR NEGATIVE NEGATIVE Final   Influenza B by PCR NEGATIVE NEGATIVE Final    Comment: (NOTE) The Xpert Xpress SARS-CoV-2/FLU/RSV plus assay is intended as an aid in the diagnosis of influenza from Nasopharyngeal swab specimens and should not be used as a sole basis for treatment. Nasal washings and aspirates are unacceptable for Xpert Xpress SARS-CoV-2/FLU/RSV testing.  Fact Sheet for Patients: EntrepreneurPulse.com.au  Fact Sheet for Healthcare Providers: IncredibleEmployment.be  This test is not yet approved or cleared by the [place] FDA and has been authorized for detection and/or diagnosis of SARS-CoV-2 by FDA under an Emergency Use Authorization (EUA). This EUA will remain in effect (meaning this test can be used) for the duration of the COVID-19 declaration under Section 564(b)(1) of the Act, 21 U.S.C. section 360bbb-3(b)(1), unless the authorization is terminated or revoked.     Resp Syncytial Virus by PCR NEGATIVE NEGATIVE Final    Comment: (NOTE) Fact Sheet for Patients: EntrepreneurPulse.com.au  Fact Sheet for Healthcare Providers: IncredibleEmployment.be  This test is not yet approved or cleared by the [place] FDA and has been authorized for detection and/or diagnosis of SARS-CoV-2 by FDA under an Emergency Use Authorization (EUA). This EUA will remain in effect (meaning this test can be used) for the duration of the COVID-19 declaration under Section 564(b)(1) of the Act, 21 U.S.C. section 360bbb-3(b)(1), unless the authorization is terminated or revoked.  Performed at [facility], [address] [place], [place] [address]     Please note: You were cared for by a hospitalist during your hospital stay. Once you are discharged, your primary care  physician will handle any further medical issues. Please note that NO REFILLS for any discharge medications will be authorized once you are discharged, as it is imperative that you return to your primary care physician (or establish a relationship with a primary care physician if you do not have one) for your post hospital discharge needs so that they can reassess your need for medications and monitor your lab values.    Time coordinating discharge: 40 minutes  SIGNED:   Shelly Coss, MD  [facility] 08/22/2022, 9:32 AM Pager [phone]  If 7PM-7AM, please contact night-coverage [email or web address] Password [email or web address]

## 2022-08-22 NOTE — Plan of Care (Signed)
  Problem: Clinical Measurements: Goal: Ability to avoid or minimize complications of infection will improve Outcome: Progressing   Problem: Skin Integrity: Goal: Skin integrity will improve Outcome: Progressing   Problem: Education: Goal: Knowledge of General Education information will improve Description: Including pain rating scale, medication(s)/side effects and non-pharmacologic comfort measures Outcome: Progressing   Problem: Health Behavior/Discharge Planning: Goal: Ability to manage health-related needs will improve Outcome: Progressing

## 2022-08-22 NOTE — Progress Notes (Signed)
Discharged home with instructions given on medications and follow up visits,patient verbalized understanding. Prescriptions sent to Pharmacy of choice documented on AVS. IV discontinued,catheter intact.Accompanied by staff to an awaiting vehicle. 

## 2022-08-22 NOTE — Care Management Important Message (Signed)
Important Message  Patient Details  Name: Carly [name] MRN: [id number] Date of Birth: May 17, 1981   Medicare Important Message Given:  N/A - LOS <3 / Initial given by admissions     Tommy Medal 08/22/2022, 11:27 AM

## 2022-08-25 LAB — CULTURE, BLOOD (ROUTINE X 2)
Culture: NO GROWTH
Culture: NO GROWTH
Special Requests: ADEQUATE

## 2022-08-27 ENCOUNTER — Ambulatory Visit
Admission: RE | Admit: 2022-08-27 | Discharge: 2022-08-27 | Disposition: A | Discharge status: Home / Self Care | Payer: PPO | Source: Ambulatory Visit | Attending: Hematology | Admitting: Hematology

## 2022-08-27 DIAGNOSIS — R928 Other abnormal and inconclusive findings on diagnostic imaging of breast: Secondary | ICD-10-CM

## 2022-08-27 DIAGNOSIS — N6324 Unspecified lump in the left breast, lower inner quadrant: Secondary | ICD-10-CM | POA: Diagnosis not present

## 2022-08-27 DIAGNOSIS — D1779 Benign lipomatous neoplasm of other sites: Secondary | ICD-10-CM | POA: Diagnosis not present

## 2022-08-27 DIAGNOSIS — Z853 Personal history of malignant neoplasm of breast: Secondary | ICD-10-CM | POA: Diagnosis not present

## 2022-08-27 HISTORY — PX: BREAST BIOPSY: SHX20

## 2022-08-28 DIAGNOSIS — L0390 Cellulitis, unspecified: Secondary | ICD-10-CM | POA: Diagnosis not present

## 2022-08-28 DIAGNOSIS — Z6841 Body Mass Index (BMI) 40.0 and over, adult: Secondary | ICD-10-CM | POA: Diagnosis not present

## 2022-08-30 DIAGNOSIS — Z1501 Genetic susceptibility to malignant neoplasm of breast: Secondary | ICD-10-CM | POA: Diagnosis not present

## 2022-08-30 DIAGNOSIS — Z6841 Body Mass Index (BMI) 40.0 and over, adult: Secondary | ICD-10-CM | POA: Diagnosis not present

## 2022-08-30 DIAGNOSIS — Z853 Personal history of malignant neoplasm of breast: Secondary | ICD-10-CM | POA: Diagnosis not present

## 2022-08-30 DIAGNOSIS — N952 Postmenopausal atrophic vaginitis: Secondary | ICD-10-CM | POA: Diagnosis not present

## 2022-08-30 DIAGNOSIS — Z1509 Genetic susceptibility to other malignant neoplasm: Secondary | ICD-10-CM | POA: Diagnosis not present

## 2022-08-30 DIAGNOSIS — Z9013 Acquired absence of bilateral breasts and nipples: Secondary | ICD-10-CM | POA: Diagnosis not present

## 2022-08-30 DIAGNOSIS — Z01419 Encounter for gynecological examination (general) (routine) without abnormal findings: Secondary | ICD-10-CM | POA: Diagnosis not present

## 2022-08-30 DIAGNOSIS — Z90722 Acquired absence of ovaries, bilateral: Secondary | ICD-10-CM | POA: Diagnosis not present

## 2022-10-11 DIAGNOSIS — F1120 Opioid dependence, uncomplicated: Secondary | ICD-10-CM | POA: Diagnosis not present

## 2022-10-11 DIAGNOSIS — G620 Drug-induced polyneuropathy: Secondary | ICD-10-CM | POA: Diagnosis not present

## 2022-10-24 ENCOUNTER — Other Ambulatory Visit: Payer: Self-pay | Admitting: Hematology

## 2022-10-24 DIAGNOSIS — C50911 Malignant neoplasm of unspecified site of right female breast: Secondary | ICD-10-CM

## 2022-10-31 ENCOUNTER — Inpatient Hospital Stay: Payer: PPO

## 2022-11-01 DIAGNOSIS — G43909 Migraine, unspecified, not intractable, without status migrainosus: Secondary | ICD-10-CM | POA: Diagnosis not present

## 2022-11-01 DIAGNOSIS — E669 Obesity, unspecified: Secondary | ICD-10-CM | POA: Diagnosis not present

## 2022-11-01 DIAGNOSIS — C50511 Malignant neoplasm of lower-outer quadrant of right female breast: Secondary | ICD-10-CM | POA: Diagnosis not present

## 2022-11-01 DIAGNOSIS — I890 Lymphedema, not elsewhere classified: Secondary | ICD-10-CM | POA: Diagnosis not present

## 2022-11-01 DIAGNOSIS — Z86718 Personal history of other venous thrombosis and embolism: Secondary | ICD-10-CM | POA: Insufficient documentation

## 2022-11-01 DIAGNOSIS — Z6841 Body Mass Index (BMI) 40.0 and over, adult: Secondary | ICD-10-CM | POA: Diagnosis not present

## 2022-11-01 DIAGNOSIS — Z79811 Long term (current) use of aromatase inhibitors: Secondary | ICD-10-CM | POA: Diagnosis not present

## 2022-11-01 DIAGNOSIS — Z91040 Latex allergy status: Secondary | ICD-10-CM | POA: Diagnosis not present

## 2022-11-01 DIAGNOSIS — M79601 Pain in right arm: Secondary | ICD-10-CM | POA: Diagnosis not present

## 2022-11-01 DIAGNOSIS — F419 Anxiety disorder, unspecified: Secondary | ICD-10-CM | POA: Diagnosis not present

## 2022-11-01 DIAGNOSIS — A419 Sepsis, unspecified organism: Secondary | ICD-10-CM | POA: Diagnosis not present

## 2022-11-01 DIAGNOSIS — Z853 Personal history of malignant neoplasm of breast: Secondary | ICD-10-CM | POA: Diagnosis not present

## 2022-11-01 DIAGNOSIS — L03113 Cellulitis of right upper limb: Secondary | ICD-10-CM | POA: Diagnosis not present

## 2022-11-01 DIAGNOSIS — Z792 Long term (current) use of antibiotics: Secondary | ICD-10-CM | POA: Diagnosis not present

## 2022-11-01 DIAGNOSIS — R600 Localized edema: Secondary | ICD-10-CM | POA: Diagnosis not present

## 2022-11-01 DIAGNOSIS — C50911 Malignant neoplasm of unspecified site of right female breast: Secondary | ICD-10-CM | POA: Diagnosis not present

## 2022-11-01 DIAGNOSIS — Z9012 Acquired absence of left breast and nipple: Secondary | ICD-10-CM | POA: Diagnosis not present

## 2022-11-01 DIAGNOSIS — Z9011 Acquired absence of right breast and nipple: Secondary | ICD-10-CM | POA: Diagnosis not present

## 2022-11-01 DIAGNOSIS — R6510 Systemic inflammatory response syndrome (SIRS) of non-infectious origin without acute organ dysfunction: Secondary | ICD-10-CM | POA: Diagnosis not present

## 2022-11-01 DIAGNOSIS — G629 Polyneuropathy, unspecified: Secondary | ICD-10-CM | POA: Diagnosis not present

## 2022-11-01 DIAGNOSIS — Z79899 Other long term (current) drug therapy: Secondary | ICD-10-CM | POA: Diagnosis not present

## 2022-11-01 DIAGNOSIS — M7989 Other specified soft tissue disorders: Secondary | ICD-10-CM | POA: Diagnosis not present

## 2022-11-01 DIAGNOSIS — Z90722 Acquired absence of ovaries, bilateral: Secondary | ICD-10-CM | POA: Diagnosis not present

## 2022-11-01 DIAGNOSIS — E876 Hypokalemia: Secondary | ICD-10-CM | POA: Diagnosis not present

## 2022-11-01 DIAGNOSIS — Z9221 Personal history of antineoplastic chemotherapy: Secondary | ICD-10-CM | POA: Diagnosis not present

## 2022-11-07 DIAGNOSIS — L0390 Cellulitis, unspecified: Secondary | ICD-10-CM | POA: Diagnosis not present

## 2022-11-07 DIAGNOSIS — Z6841 Body Mass Index (BMI) 40.0 and over, adult: Secondary | ICD-10-CM | POA: Diagnosis not present

## 2022-11-09 ENCOUNTER — Inpatient Hospital Stay: Payer: PPO

## 2023-01-07 DIAGNOSIS — F1120 Opioid dependence, uncomplicated: Secondary | ICD-10-CM | POA: Diagnosis not present

## 2023-01-07 DIAGNOSIS — G620 Drug-induced polyneuropathy: Secondary | ICD-10-CM | POA: Diagnosis not present

## 2023-01-24 ENCOUNTER — Other Ambulatory Visit: Payer: Self-pay | Admitting: Hematology

## 2023-01-24 DIAGNOSIS — Z1501 Genetic susceptibility to malignant neoplasm of breast: Secondary | ICD-10-CM

## 2023-01-24 DIAGNOSIS — C50911 Malignant neoplasm of unspecified site of right female breast: Secondary | ICD-10-CM

## 2023-01-29 ENCOUNTER — Other Ambulatory Visit: Payer: Self-pay

## 2023-01-29 DIAGNOSIS — C50011 Malignant neoplasm of nipple and areola, right female breast: Secondary | ICD-10-CM

## 2023-01-31 ENCOUNTER — Inpatient Hospital Stay: Payer: PPO

## 2023-02-05 ENCOUNTER — Inpatient Hospital Stay: Payer: PPO | Attending: Hematology

## 2023-02-05 DIAGNOSIS — Z8042 Family history of malignant neoplasm of prostate: Secondary | ICD-10-CM | POA: Insufficient documentation

## 2023-02-05 DIAGNOSIS — M8580 Other specified disorders of bone density and structure, unspecified site: Secondary | ICD-10-CM | POA: Insufficient documentation

## 2023-02-05 DIAGNOSIS — Z170 Estrogen receptor positive status [ER+]: Secondary | ICD-10-CM | POA: Insufficient documentation

## 2023-02-05 DIAGNOSIS — C50012 Malignant neoplasm of nipple and areola, left female breast: Secondary | ICD-10-CM

## 2023-02-05 DIAGNOSIS — Z1501 Genetic susceptibility to malignant neoplasm of breast: Secondary | ICD-10-CM | POA: Diagnosis not present

## 2023-02-05 DIAGNOSIS — G629 Polyneuropathy, unspecified: Secondary | ICD-10-CM | POA: Insufficient documentation

## 2023-02-05 DIAGNOSIS — Z79899 Other long term (current) drug therapy: Secondary | ICD-10-CM | POA: Diagnosis not present

## 2023-02-05 DIAGNOSIS — Z923 Personal history of irradiation: Secondary | ICD-10-CM | POA: Diagnosis not present

## 2023-02-05 DIAGNOSIS — C50511 Malignant neoplasm of lower-outer quadrant of right female breast: Secondary | ICD-10-CM | POA: Diagnosis not present

## 2023-02-05 DIAGNOSIS — E538 Deficiency of other specified B group vitamins: Secondary | ICD-10-CM | POA: Diagnosis not present

## 2023-02-05 DIAGNOSIS — Z79811 Long term (current) use of aromatase inhibitors: Secondary | ICD-10-CM | POA: Diagnosis not present

## 2023-02-05 DIAGNOSIS — Z6841 Body Mass Index (BMI) 40.0 and over, adult: Secondary | ICD-10-CM | POA: Diagnosis not present

## 2023-02-05 DIAGNOSIS — M5459 Other low back pain: Secondary | ICD-10-CM | POA: Diagnosis not present

## 2023-02-05 DIAGNOSIS — Z1502 Genetic susceptibility to malignant neoplasm of ovary: Secondary | ICD-10-CM | POA: Diagnosis not present

## 2023-02-05 DIAGNOSIS — Z0000 Encounter for general adult medical examination without abnormal findings: Secondary | ICD-10-CM | POA: Diagnosis not present

## 2023-02-05 LAB — CBC WITH DIFFERENTIAL/PLATELET
Abs Immature Granulocytes: 0 10*3/uL (ref 0.00–0.07)
Basophils Absolute: 0 10*3/uL (ref 0.0–0.1)
Basophils Relative: 0 %
Eosinophils Absolute: 0.1 10*3/uL (ref 0.0–0.5)
Eosinophils Relative: 2 %
HCT: 33.5 % — ABNORMAL LOW (ref 36.0–46.0)
Hemoglobin: 11.4 g/dL — ABNORMAL LOW (ref 12.0–15.0)
Immature Granulocytes: 0 %
Lymphocytes Relative: 33 %
Lymphs Abs: 1.5 10*3/uL (ref 0.7–4.0)
MCH: 30 pg (ref 26.0–34.0)
MCHC: 34 g/dL (ref 30.0–36.0)
MCV: 88.2 fL (ref 80.0–100.0)
Monocytes Absolute: 0.5 10*3/uL (ref 0.1–1.0)
Monocytes Relative: 10 %
Neutro Abs: 2.6 10*3/uL (ref 1.7–7.7)
Neutrophils Relative %: 55 %
Platelets: 163 10*3/uL (ref 150–400)
RBC: 3.8 MIL/uL — ABNORMAL LOW (ref 3.87–5.11)
RDW: 13.1 % (ref 11.5–15.5)
WBC: 4.6 10*3/uL (ref 4.0–10.5)
nRBC: 0 % (ref 0.0–0.2)

## 2023-02-05 LAB — COMPREHENSIVE METABOLIC PANEL
ALT: 14 U/L (ref 0–44)
AST: 15 U/L (ref 15–41)
Albumin: 3.9 g/dL (ref 3.5–5.0)
Alkaline Phosphatase: 42 U/L (ref 38–126)
Anion gap: 7 (ref 5–15)
BUN: 14 mg/dL (ref 6–20)
CO2: 27 mmol/L (ref 22–32)
Calcium: 9.2 mg/dL (ref 8.9–10.3)
Chloride: 101 mmol/L (ref 98–111)
Creatinine, Ser: 0.62 mg/dL (ref 0.44–1.00)
GFR, Estimated: >60 mL/min (ref >60)
Glucose, Bld: 101 mg/dL — ABNORMAL HIGH (ref 70–99)
Potassium: 3.6 mmol/L (ref 3.5–5.1)
Sodium: 135 mmol/L (ref 135–145)
Total Bilirubin: 0.5 mg/dL (ref 0.3–1.2)
Total Protein: 6.8 g/dL (ref 6.5–8.1)

## 2023-02-05 MED ORDER — HEPARIN SOD (PORK) LOCK FLUSH 100 UNIT/ML IV SOLN
500.0000 [IU] | Freq: Once | INTRAVENOUS | Status: AC
  Administered 2023-02-05: 500 [IU] via INTRAVENOUS

## 2023-02-05 MED ORDER — SODIUM CHLORIDE 0.9% FLUSH
10.0000 mL | INTRAVENOUS | Status: DC | PRN
  Administered 2023-02-05: 10 mL via INTRAVENOUS

## 2023-02-05 NOTE — Progress Notes (Signed)
Carly [name] presented for Portacath access and flush with labs Portacath located  left chest wall accessed with  H 20 needle.  Good blood return present. Portacath flushed with 20ml NS and 500U/25ml Heparin and needle removed intact. No bruising or swelling noted at the site Procedure tolerated well and without incident.     Discharged from clinic ambulatory in stable condition. Alert and oriented x 3. F/U with [facility] as scheduled.

## 2023-02-06 NOTE — Progress Notes (Signed)
[facility] [address] [place] [address]    Clinic Day:  02/07/2023  Referring physician: Toma Deiters, MD  Patient Care Team: Toma Deiters, MD as PCP - General (Internal Medicine)   ASSESSMENT & PLAN:   Assessment: 1.  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  She had cancer of the right breast in 2017 and had lymph node dissection.  Surgeries were done in [place]. - Adjuvant chemotherapy with dose dense AC followed by Taxol from 08/15/2015 through 11/22/2015, XRT completed on 01/27/2016. -Femara started in August 2017, switched to anastrozole around August 2018 for joint pains which have slightly improved. - TAH and BSO in 2016 prophylactically for her BRCA1 positivity. - Bone scan on 12/16/2018 did not show any evidence of metastatic disease.   2.  Family history: -Mother had breast cancer at age 61, sister who had breast cancer at age 39, maternal aunt breast cancer at age 10, father died of metastatic prostate cancer.  Paternal grandmother had metastatic cervical cancer.   3.  Osteopenia: -DEXA scan on 04/23/2018 shows T score of -1.6. -She was started on Prolia every 6 months in December 2017. - DEXA scan 05/31/2020: T-score -1.7, osteopenia.  Compared to 04/23/2018, slight decrease in density.  Plan: 1.  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 - She had breast MRI in February followed by biopsy of the 8:00 region mass in the left breast.  This was benign. - Physical exam today: No palpable suspicious masses at the implant sites. - Labs today: Normal LFTs and creatinine.  CBC grossly normal.  CA 15-3 was 14.9. - RTC after breast MRI in February next year.  Will plan repeating labs.      2.  Osteopenia: - Continue vitamin D supplements. - Calcium today is normal at 9.2.  She will receive Prolia today. - Will plan on repeating DEXA scan after next visit.  3.  B12 deficiency: - Continue monthly B12 injections.   Breast Cancer therapy associated bone loss: I have recommended calcium, Vitamin D and weight bearing exercises.   Orders Placed This Encounter  Procedures   MR BREAST BILATERAL W WO CONTRAST INC CAD    Standing Status:   Future    Standing Expiration Date:   02/07/2024    Order Specific Question:   If indicated for the ordered procedure, I authorize the administration of contrast media per Radiology protocol    Answer:   Yes    Order Specific Question:   What is the patient's sedation requirement?    Answer:   No Sedation    Order Specific Question:   Does the patient have a pacemaker or implanted devices?    Answer:   No    Order Specific Question:   Preferred imaging location?    Answer:   [facility] (table limit - 550lbs)   CBC with Differential/Platelet    Standing Status:   Future    Standing Expiration Date:   02/07/2024    Order Specific Question:   Release to patient    Answer:   Immediate   Comprehensive metabolic panel    Standing Status:   Future    Standing Expiration Date:   02/07/2024    Order Specific Question:   Release  to patient    Answer:   Immediate   VITAMIN D 25 Hydroxy (Vit-D Deficiency, Fractures)    Standing Status:   Future    Standing Expiration Date:   02/07/2024    Order Specific Question:   Release to patient    Answer:   Immediate   Cancer antigen 15-3    Standing Status:   Future    Standing Expiration Date:   02/07/2024      Alben Deeds Teague,acting as a scribe for Doreatha Massed, MD.,have documented all relevant documentation on the behalf of Doreatha Massed, MD,as directed by  Doreatha Massed, MD while in the presence of Doreatha Massed, MD.   I, Doreatha Massed MD, have reviewed the  above documentation for accuracy and completeness, and I agree with the above.   Doreatha Massed, MD   8/15/20245:35 PM  CHIEF COMPLAINT:   Diagnosis: Malignant neoplasm of lower-outer quadrant of right breast of female, estrogen receptor positive    Cancer Staging  No matching staging information was found for the patient.    Prior Therapy: Surgery followed by chemotherapy  Current Therapy: Anastrozole   HISTORY OF PRESENT ILLNESS:   Oncology History  Breast cancer (HCC)  05/06/2015 Initial Biopsy   Incisional biopsy with Dr. Hulan Fray of palpable mass, lower outer quadrant of reconstructed R breast close to nipple ER+ 97%, PR+ 70 (weak) HER 2 - by [facility]   07/06/2015 Surgery   Definitive lumpectomy and LN dissection performed revealing a 3.1 cm primary 1/14 positive LN with a macro metastases, satellite nodules adjacent to the primary, LVI-, however she had skin invasion along with satellite nodules, T4b N1a, Stage IIIB, grade IIII,   07/07/2015 Imaging   Per records negative CT C/A/P and bone scan   08/15/2015 - 11/22/2015 Chemotherapy   AC x 4, Taxol X 4 both given dose dense    - 01/27/2016 Radiation Therapy   Dr. Pamelia Hoit. Michell Heinrich   02/06/2016 Imaging   Bone density- BMD as determined from [facility] L1-L4 is 1.021 g/cm2 with a T-Score of -1.3. This patient is considered osteopenic according to World Health Organization [facility]) criteria.       INTERVAL HISTORY:   Carly [name] is a 42 y.o. female presenting to clinic today for follow up of right breast cancer. She was last seen by me on 08/02/22.  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   Since her last visit, she was admitted to the ED on 2/26 for cellulitis of the right upper extremity. She was given Vancomycin and cefepime. She was seen again in the ED at [facility] on 5/9 for a migraine and right arm cellulitis.   Today, she states that she is doing well  overall. Her appetite level is at 100%. Her energy level is at 10%.  PAST MEDICAL HISTORY:   Past Medical History: Past Medical History:  Diagnosis Date   Anxiety    Breast cancer (HCC) 06/26/2011   BRCA1 positive   Neuropathy    Osteopenia     Surgical History: Past Surgical History:  Procedure Laterality Date   ABDOMINAL HYSTERECTOMY     BREAST BIOPSY Left 08/27/2022   [place] LT BREAST BX W LOC DEV 1ST LESION IMG BX SPEC US GUIDE 08/27/2022 [facility]GRAP[facility]   BREAST SURGERY     CESAREAN SECTION     MASTECTOMY      Social History: Social History   Socioeconomic History   Marital  status: Single    Spouse name: Not on file   Number of children: Not on file   Years of education: Not on file   Highest education level: Not on file  Occupational History   Not on file  Tobacco Use   Smoking status: Never   Smokeless tobacco: Never  Substance and Sexual Activity   Alcohol use: Yes    Comment: occ   Drug use: No   Sexual activity: Not on file  Other Topics Concern   Not on file  Social History Narrative   Not on file   Social Determinants of Health   Financial Resource Strain: Low Risk  (11/02/2022)   [facility]   Overall Financial Resource Strain (CARDIA)    Difficulty of Paying Living Expenses: Not very hard  Food Insecurity: No Food Insecurity (11/02/2022)   [facility], [facility]   Hunger Vital Sign    Worried About Running Out of Food in the Last Year: Never true    Ran Out of Food in the Last Year: Never true  Transportation Needs: No Transportation Needs (08/21/2022)   PRAPARE - Administrator, Civil Service (Medical): No    Lack of Transportation (Non-Medical): No  Physical Activity: Not on file  Stress: Not on file  Social Connections: Not on file  Intimate Partner Violence: Not At Risk (08/30/2022)   [facility] from [facility], [facility]   Humiliation, Afraid, Rape, and Kick  questionnaire    Fear of Current or Ex-Partner: No    Emotionally Abused: No    Physically Abused: No    Sexually Abused: No    Family History: Family History  Problem Relation Age of Onset   Breast cancer Mother 65   BRCA 1/2 Mother    Prostate cancer Father        metastatic   Breast cancer Sister 31       BRCA1 mutation   Breast cancer Maternal Aunt        dx in her 64s; BRCA1 mutation   Heart attack Maternal Grandfather    Ovarian cancer Paternal Grandmother        dx in her 64s   Heart attack Paternal Grandfather    Healthy Sister    BRCA 1/2 Maternal Aunt    Healthy Maternal Aunt        negative for a BRCA mutation   Healthy Maternal Aunt        negative for a BRCA mutation    Current Medications:  Current Outpatient Medications:    ALPRAZolam (XANAX) 0.5 MG tablet, TAKE ONE TABLET BY MOUTH AT BEDTIME AS NEEDED FOR ANXIETY, Disp: 30 tablet, Rfl: 5   anastrozole (ARIMIDEX) 1 MG tablet, TAKE 1 TABLET BY MOUTH ONCE DAILY, Disp: 30 tablet, Rfl: 6   ascorbic acid (VITAMIN C) 100 MG tablet, Take 100 mg by mouth daily., Disp: , Rfl:    Calcium-Magnesium-Vitamin D (CALCIUM 500 PO), Take 1 tablet by mouth daily., Disp: , Rfl:    cyanocobalamin (VITAMIN B12) 1000 MCG/ML injection, INJECT INTO THE MUSCLE EVERY 30 DAYS (Patient taking differently: Inject 100 mcg into the muscle every 30 (thirty) days.), Disp: 3 mL, Rfl: 6   docusate sodium (COLACE) 100 MG capsule, Take 100 mg by mouth daily as needed for mild constipation., Disp: , Rfl:    DULoxetine (CYMBALTA) 30 MG capsule, Take 30 mg by mouth daily. , Disp: ,  Rfl: 1   DULoxetine (CYMBALTA) 60 MG capsule, Take 1 capsule (60 mg total) by mouth daily., Disp: 90 capsule, Rfl: 3   gabapentin (NEURONTIN) 600 MG tablet, Take 600 mg by mouth at bedtime., Disp: , Rfl: 1   HYDROcodone-acetaminophen (NORCO) 10-325 MG tablet, Take 1 tablet by mouth every 4 (four) hours as needed. (Patient taking differently: Take 1 tablet by mouth  every 4 (four) hours as needed for moderate pain.), Disp: 10 tablet, Rfl: 0   Misc. Devices MISC, Please provide patient with a compression therapy pump fot her Right arm lymphedema., Disp: 1 each, Rfl: 0   Multiple Vitamins-Minerals (MULTIVITAMIN ADULT PO), Take 1 tablet by mouth daily., Disp: , Rfl:    potassium chloride SA (KLOR-CON M) 20 MEQ tablet, Take 2 tablets (40 mEq total) by mouth daily for 3 days., Disp: 6 tablet, Rfl: 0   promethazine (PHENERGAN) 12.5 MG tablet, Take 1 tablet (12.5 mg total) by mouth every 4 (four) hours as needed for nausea or vomiting., Disp: 30 tablet, Rfl: 0   valACYclovir (VALTREX) 1000 MG tablet, Take 2,000 mg by mouth 2 (two) times daily as needed (cold sores)., Disp: , Rfl:    Allergies: Allergies  Allergen Reactions   Latex Rash   Other Rash    Oozing blistery rash   Sumatriptan Other (See Comments)    Other reaction(s): Unknown Numbness of face and arms   Tape Rash    Oozing blistery rash    REVIEW OF SYSTEMS:   Review of Systems  Constitutional:  Positive for fatigue. Negative for chills and fever.  HENT:   Negative for lump/mass, mouth sores, nosebleeds, sore throat and trouble swallowing.   Eyes:  Negative for eye problems.  Respiratory:  Negative for cough and shortness of breath.   Cardiovascular:  Negative for chest pain, leg swelling and palpitations.  Gastrointestinal:  Negative for abdominal pain, constipation, diarrhea, nausea and vomiting.  Genitourinary:  Negative for bladder incontinence, difficulty urinating, dysuria, frequency, hematuria and nocturia.   Musculoskeletal:  Negative for arthralgias, back pain, flank pain, myalgias and neck pain.  Skin:  Negative for itching and rash.  Neurological:  Negative for dizziness, headaches and numbness.  Hematological:  Does not bruise/bleed easily.  Psychiatric/Behavioral:  Positive for sleep disturbance. Negative for depression and suicidal ideas. The patient is not nervous/anxious.    All other systems reviewed and are negative.    VITALS:   Blood pressure 123/87, pulse 81, temperature 98.3 F (36.8 C), temperature source Oral, resp. rate 18, height 5\' 7"  (1.702 m), weight 280 lb 1.6 oz (127.1 kg), SpO2 98%.  Wt Readings from Last 3 Encounters:  02/07/23 280 lb 1.6 oz (127.1 kg)  08/20/22 275 lb (124.7 kg)  08/02/22 279 lb 9.6 oz (126.8 kg)    Body mass index is 43.87 kg/m.  Performance status (ECOG): 1 - Symptomatic but completely ambulatory  PHYSICAL EXAM:   Physical Exam Vitals and nursing note reviewed. Exam conducted with a chaperone present.  Constitutional:      Appearance: Normal appearance.  Cardiovascular:     Rate and Rhythm: Normal rate and regular rhythm.     Pulses: Normal pulses.     Heart sounds: Normal heart sounds.  Pulmonary:     Effort: Pulmonary effort is normal.     Breath sounds: Normal breath sounds.  Abdominal:     Palpations: Abdomen is soft. There is no hepatomegaly, splenomegaly or mass.     Tenderness: There is no abdominal tenderness.  Musculoskeletal:     Right lower leg: No edema.     Left lower leg: No edema.  Lymphadenopathy:     Cervical: No cervical adenopathy.     Right cervical: No superficial, deep or posterior cervical adenopathy.    Left cervical: No superficial, deep or posterior cervical adenopathy.     Upper Body:     Right upper body: No supraclavicular or axillary adenopathy.     Left upper body: No supraclavicular or axillary adenopathy.  Neurological:     General: No focal deficit present.     Mental Status: She is alert and oriented to person, place, and time.  Psychiatric:        Mood and Affect: Mood normal.        Behavior: Behavior normal.     LABS:      Latest Ref Rng & Units 02/05/2023   12:07 PM 08/21/2022    4:10 AM 08/20/2022    3:08 PM  CBC  WBC 4.0 - 10.5 K/uL 4.6  10.2  9.9   Hemoglobin 12.0 - 15.0 g/dL 16.1  09.6  04.5   Hematocrit 36.0 - 46.0 % 33.5  33.5  36.4   Platelets  150 - 400 K/uL 163  132  128       Latest Ref Rng & Units 02/05/2023   12:07 PM 08/22/2022    7:36 AM 08/22/2022    4:01 AM  CMP  Glucose 70 - 99 mg/dL 409  811    BUN 6 - 20 mg/dL 14  12    Creatinine 9.14 - 1.00 mg/dL 7.82  9.56  2.13   Sodium 135 - 145 mmol/L 135  135    Potassium 3.5 - 5.1 mmol/L 3.6  3.3    Chloride 98 - 111 mmol/L 101  105    CO2 22 - 32 mmol/L 27  20    Calcium 8.9 - 10.3 mg/dL 9.2  7.8    Total Protein 6.5 - 8.1 g/dL 6.8     Total Bilirubin 0.3 - 1.2 mg/dL 0.5     Alkaline Phos 38 - 126 U/L 42     AST 15 - 41 U/L 15     ALT 0 - 44 U/L 14        No results found for: "CEA1", "CEA" / No results found for: "CEA1", "CEA" No results found for: "PSA1" No results found for: "YQM578" No results found for: "CAN125"  No results found for: "TOTALPROTELP", "ALBUMINELP", "A1GS", "A2GS", "BETS", "BETA2SER", "GAMS", "MSPIKE", "SPEI" Lab Results  Component Value Date   TIBC 352 07/24/2021   TIBC 294 10/11/2020   TIBC 325 04/08/2020   FERRITIN 68 07/24/2021   FERRITIN 116 10/11/2020   FERRITIN 121 04/08/2020   IRONPCTSAT 29 07/24/2021   IRONPCTSAT 21 10/11/2020   IRONPCTSAT 21 04/08/2020   Lab Results  Component Value Date   LDH 155 07/24/2021   LDH 170 10/11/2020   LDH 168 04/08/2020     STUDIES:   No results found.

## 2023-02-07 ENCOUNTER — Inpatient Hospital Stay: Payer: PPO

## 2023-02-07 ENCOUNTER — Inpatient Hospital Stay (HOSPITAL_BASED_OUTPATIENT_CLINIC_OR_DEPARTMENT_OTHER): Payer: PPO | Admitting: Hematology

## 2023-02-07 VITALS — BP 123/87 | HR 81 | Temp 98.3°F | Resp 18 | Ht 67.0 in | Wt 280.1 lb

## 2023-02-07 DIAGNOSIS — M8580 Other specified disorders of bone density and structure, unspecified site: Secondary | ICD-10-CM

## 2023-02-07 DIAGNOSIS — C50511 Malignant neoplasm of lower-outer quadrant of right female breast: Secondary | ICD-10-CM | POA: Diagnosis not present

## 2023-02-07 DIAGNOSIS — C50012 Malignant neoplasm of nipple and areola, left female breast: Secondary | ICD-10-CM

## 2023-02-07 DIAGNOSIS — C50011 Malignant neoplasm of nipple and areola, right female breast: Secondary | ICD-10-CM | POA: Diagnosis not present

## 2023-02-07 DIAGNOSIS — Z79811 Long term (current) use of aromatase inhibitors: Secondary | ICD-10-CM

## 2023-02-07 MED ORDER — DENOSUMAB 60 MG/ML ~~LOC~~ SOSY
60.0000 mg | PREFILLED_SYRINGE | Freq: Once | SUBCUTANEOUS | Status: AC
  Administered 2023-02-07: 60 mg via SUBCUTANEOUS
  Filled 2023-02-07: qty 1

## 2023-02-07 NOTE — Progress Notes (Signed)
Patient presents today for Prolia injection per providers order.  Vital signs and labs reviewed by MD.  Message received from Anne Fu LPN/Dr. Ellin Saba patient okay for injection.  Patient has been taking Calcium and vitamin D supplements, has no upcoming or prior dental work, and no jaw pain.  Stable during administration without incident; injection site WNL; see MAR for injection details.  Patient tolerated procedure well and without incident.  No questions or complaints noted at this time.

## 2023-02-07 NOTE — Patient Instructions (Addendum)
[facility]  Discharge Instructions  You were seen and examined today by Dr. Ellin Saba.  Dr. Ellin Saba discussed your most recent lab work which revealed everything looks good and stable.  Dr. Ellin Saba is going to repeat MRI of your breast and labs before your next appointment.  Follow-up as scheduled in 7 months.    Thank you for choosing [facility] to provide your oncology and hematology care.   To afford each patient quality time with our provider, please arrive at least 15 minutes before your scheduled appointment time. You may need to reschedule your appointment if you arrive late (10 or more minutes). Arriving late affects you and other patients whose appointments are after yours.  Also, if you miss three or more appointments without notifying the office, you may be dismissed from the clinic at the provider's discretion.    Again, thank you for choosing [facility]  Our hope is that these requests will decrease the amount of time that you wait before being seen by our physicians.   If you have a lab appointment with the Cancer Center - please note that after April 8th, all labs will be drawn in the cancer center.  You do not have to check in or register with the main entrance as you have in the past but will complete your check-in at the cancer center.            _____________________________________________________________  Should you have questions after your visit to [facility], please contact our office at [phone] and follow the prompts.  Our office hours are 8:00 a.m. to 4:30 p.m. Monday - Thursday and 8:00 a.m. to 2:30 p.m. Friday.  Please note that voicemails left after 4:00 p.m. may not be returned until the following business day.  We are closed weekends and all major holidays.  You do have access to a nurse 24-7, just call the main number to the clinic [phone] and do not press  any options, hold on the line and a nurse will answer the phone.    For prescription refill requests, have your pharmacy contact our office and allow 72 hours.    Masks are no longer required in the cancer centers. If you would like for your care team to wear a mask while they are taking care of you, please let them know. You may have one support person who is at least 43 years old accompany you for your appointments.

## 2023-02-07 NOTE — Patient Instructions (Signed)
 [facility]  Discharge Instructions: Thank you for choosing [facility] to provide your oncology and hematology care.  If you have a lab appointment with the Cancer Center - please note that after April 8th, 2024, all labs will be drawn in the cancer center.  You do not have to check in or register with the main entrance as you have in the past but will complete your check-in in the cancer center.  Wear comfortable clothing and clothing appropriate for easy access to any Portacath or PICC line.   We strive to give you quality time with your provider. You may need to reschedule your appointment if you arrive late (15 or more minutes).  Arriving late affects you and other patients whose appointments are after yours.  Also, if you miss three or more appointments without notifying the office, you may be dismissed from the clinic at the provider's discretion.      For prescription refill requests, have your pharmacy contact our office and allow 72 hours for refills to be completed.    Today you received the following chemotherapy and/or immunotherapy agents Prolia      To help prevent nausea and vomiting after your treatment, we encourage you to take your nausea medication as directed.  BELOW ARE SYMPTOMS THAT SHOULD BE REPORTED IMMEDIATELY: *FEVER GREATER THAN 100.4 F (38 C) OR HIGHER *CHILLS OR SWEATING *NAUSEA AND VOMITING THAT IS NOT CONTROLLED WITH YOUR NAUSEA MEDICATION *UNUSUAL SHORTNESS OF BREATH *UNUSUAL BRUISING OR BLEEDING *URINARY PROBLEMS (pain or burning when urinating, or frequent urination) *BOWEL PROBLEMS (unusual diarrhea, constipation, pain near the anus) TENDERNESS IN MOUTH AND THROAT WITH OR WITHOUT PRESENCE OF ULCERS (sore throat, sores in mouth, or a toothache) UNUSUAL RASH, SWELLING OR PAIN  UNUSUAL VAGINAL DISCHARGE OR ITCHING   Items with * indicate a potential emergency and should be followed up as soon as possible or go to the  Emergency Department if any problems should occur.  Please show the CHEMOTHERAPY ALERT CARD or IMMUNOTHERAPY ALERT CARD at check-in to the Emergency Department and triage nurse.  Should you have questions after your visit or need to cancel or reschedule your appointment, please contact [facility] [phone]  and follow the prompts.  Office hours are 8:00 a.m. to 4:30 p.m. Monday - Friday. Please note that voicemails left after 4:00 p.m. may not be returned until the following business day.  We are closed weekends and major holidays. You have access to a nurse at all times for urgent questions. Please call the main number to the clinic [phone] and follow the prompts.  For any non-urgent questions, you may also contact your provider using MyChart. We now offer e-Visits for anyone 32 and older to request care online for non-urgent symptoms. For details visit [email or web address].   Also download the MyChart app! Go to the app store, search "MyChart", open the app, select [facility], and log in with your MyChart username and password.

## 2023-04-08 DIAGNOSIS — F1120 Opioid dependence, uncomplicated: Secondary | ICD-10-CM | POA: Diagnosis not present

## 2023-04-08 DIAGNOSIS — G620 Drug-induced polyneuropathy: Secondary | ICD-10-CM | POA: Diagnosis not present

## 2023-05-01 ENCOUNTER — Other Ambulatory Visit: Payer: Self-pay | Admitting: Hematology

## 2023-05-01 DIAGNOSIS — C50911 Malignant neoplasm of unspecified site of right female breast: Secondary | ICD-10-CM

## 2023-07-04 DIAGNOSIS — Z0000 Encounter for general adult medical examination without abnormal findings: Secondary | ICD-10-CM | POA: Diagnosis not present

## 2023-07-04 DIAGNOSIS — M5459 Other low back pain: Secondary | ICD-10-CM | POA: Diagnosis not present

## 2023-07-04 DIAGNOSIS — E66813 Obesity, class 3: Secondary | ICD-10-CM | POA: Diagnosis not present

## 2023-07-04 DIAGNOSIS — Z6841 Body Mass Index (BMI) 40.0 and over, adult: Secondary | ICD-10-CM | POA: Diagnosis not present

## 2023-07-08 DIAGNOSIS — F1120 Opioid dependence, uncomplicated: Secondary | ICD-10-CM | POA: Diagnosis not present

## 2023-07-08 DIAGNOSIS — G620 Drug-induced polyneuropathy: Secondary | ICD-10-CM | POA: Diagnosis not present

## 2023-07-10 ENCOUNTER — Encounter (INDEPENDENT_AMBULATORY_CARE_PROVIDER_SITE_OTHER): Payer: Self-pay | Admitting: *Deleted

## 2023-08-14 ENCOUNTER — Telehealth (INDEPENDENT_AMBULATORY_CARE_PROVIDER_SITE_OTHER): Payer: Self-pay | Admitting: Gastroenterology

## 2023-08-14 ENCOUNTER — Encounter (HOSPITAL_COMMUNITY): Payer: Self-pay | Admitting: Hematology

## 2023-08-14 NOTE — Telephone Encounter (Signed)
Attempted to reach pt but voicemail is full

## 2023-08-14 NOTE — Telephone Encounter (Signed)
 Ok to schedule.  Room 1/2  Thanks,  Vista Lawman, MD Gastroenterology and Hepatology [facility]

## 2023-08-14 NOTE — Telephone Encounter (Signed)
Who is your primary care physician: Dr.Hasanaj  Reasons for the colonoscopy: Screening  Have you had a colonoscopy before?  no  Do you have family history of colon cancer? no  Previous colonoscopy with polyps removed? no  Do you have a history colorectal cancer?   no  Are you diabetic? If yes, Type 1 or Type 2?    no  Do you have a prosthetic or mechanical heart valve? no  Do you have a pacemaker/defibrillator?   no  Have you had endocarditis/atrial fibrillation? no  Have you had joint replacement within the last 12 months?  no  Do you tend to be constipated or have to use laxatives? no  Do you have any history of drugs or alchohol?  no  Do you use supplemental oxygen?  no  Have you had a stroke or heart attack within the last 6 months? no  Do you take weight loss medication?  no  For female patients: have you had a hysterectomy?  yes                                     are you post menopausal?       yes                                            do you still have your menstrual cycle? no      Do you take any blood-thinning medications such as: (aspirin, warfarin, Plavix, Aggrenox)  no  If yes we need the name, milligram, dosage and who is prescribing doctor  Current Outpatient Medications on File Prior to Visit  Medication Sig Dispense Refill   ALPRAZolam (XANAX) 0.5 MG tablet TAKE 1 TABLET BY MOUTH AT BEDTIME AS NEEDED FOR ANXIETY 30 tablet 4   anastrozole (ARIMIDEX) 1 MG tablet TAKE 1 TABLET BY MOUTH ONCE DAILY 30 tablet 6   ascorbic acid (VITAMIN C) 100 MG tablet Take 100 mg by mouth daily.     Calcium-Magnesium-Vitamin D (CALCIUM 500 PO) Take 1 tablet by mouth daily.     cyanocobalamin (VITAMIN B12) 1000 MCG/ML injection INJECT INTO THE MUSCLE EVERY 30 DAYS (Patient taking differently: Inject 100 mcg into the muscle every 30 (thirty) days.) 3 mL 6   denosumab (PROLIA) 60 MG/ML SOSY injection Inject 60 mg into the skin every 6 (six) months.     DULoxetine  (CYMBALTA) 30 MG capsule Take 30 mg by mouth daily.   1   DULoxetine (CYMBALTA) 60 MG capsule Take 1 capsule (60 mg total) by mouth daily. 90 capsule 3   gabapentin (NEURONTIN) 600 MG tablet Take 600 mg by mouth at bedtime.  1   HYDROcodone-acetaminophen (NORCO) 10-325 MG tablet Take 1 tablet by mouth every 4 (four) hours as needed. (Patient taking differently: Take 1 tablet by mouth every 4 (four) hours as needed for moderate pain (pain score 4-6).) 10 tablet 0   Misc. Devices MISC Please provide patient with a compression therapy pump fot her Right arm lymphedema. 1 each 0   docusate sodium (COLACE) 100 MG capsule Take 100 mg by mouth daily as needed for mild constipation. (Patient not taking: Reported on 08/14/2023)     Multiple Vitamins-Minerals (MULTIVITAMIN ADULT PO) Take 1 tablet by mouth daily. (Patient not taking: Reported on 08/14/2023)  potassium chloride SA (KLOR-CON M) 20 MEQ tablet Take 2 tablets (40 mEq total) by mouth daily for 3 days. 6 tablet 0   promethazine (PHENERGAN) 12.5 MG tablet Take 1 tablet (12.5 mg total) by mouth every 4 (four) hours as needed for nausea or vomiting. (Patient not taking: Reported on 08/14/2023) 30 tablet 0   valACYclovir (VALTREX) 1000 MG tablet Take 2,000 mg by mouth 2 (two) times daily as needed (cold sores). (Patient not taking: Reported on 08/14/2023)     No current facility-administered medications on file prior to visit.    Allergies  Allergen Reactions   Latex Rash   Other Rash    Oozing blistery rash   Sumatriptan Other (See Comments)    Other reaction(s): Unknown Numbness of face and arms   Tape Rash    Oozing blistery rash     Pharmacy: [facility] Name: Katy Fitch number where you can be reached: [phone]

## 2023-08-15 ENCOUNTER — Inpatient Hospital Stay: Payer: No Typology Code available for payment source

## 2023-08-19 ENCOUNTER — Inpatient Hospital Stay: Payer: Medicaid Other

## 2023-08-20 ENCOUNTER — Other Ambulatory Visit: Payer: Self-pay | Admitting: Hematology

## 2023-08-21 ENCOUNTER — Encounter (HOSPITAL_COMMUNITY): Payer: Self-pay | Admitting: Hematology

## 2023-08-21 ENCOUNTER — Inpatient Hospital Stay: Payer: No Typology Code available for payment source | Attending: Hematology

## 2023-08-21 ENCOUNTER — Inpatient Hospital Stay: Payer: Medicaid Other

## 2023-08-21 ENCOUNTER — Encounter (INDEPENDENT_AMBULATORY_CARE_PROVIDER_SITE_OTHER): Payer: Self-pay | Admitting: *Deleted

## 2023-08-21 DIAGNOSIS — C50011 Malignant neoplasm of nipple and areola, right female breast: Secondary | ICD-10-CM

## 2023-08-21 DIAGNOSIS — Z79811 Long term (current) use of aromatase inhibitors: Secondary | ICD-10-CM | POA: Diagnosis not present

## 2023-08-21 DIAGNOSIS — E538 Deficiency of other specified B group vitamins: Secondary | ICD-10-CM | POA: Diagnosis not present

## 2023-08-21 DIAGNOSIS — Z9221 Personal history of antineoplastic chemotherapy: Secondary | ICD-10-CM | POA: Insufficient documentation

## 2023-08-21 DIAGNOSIS — M8580 Other specified disorders of bone density and structure, unspecified site: Secondary | ICD-10-CM | POA: Diagnosis not present

## 2023-08-21 DIAGNOSIS — C50911 Malignant neoplasm of unspecified site of right female breast: Secondary | ICD-10-CM | POA: Insufficient documentation

## 2023-08-21 DIAGNOSIS — Z95828 Presence of other vascular implants and grafts: Secondary | ICD-10-CM

## 2023-08-21 DIAGNOSIS — Z1732 Human epidermal growth factor receptor 2 negative status: Secondary | ICD-10-CM | POA: Diagnosis not present

## 2023-08-21 DIAGNOSIS — Z923 Personal history of irradiation: Secondary | ICD-10-CM | POA: Insufficient documentation

## 2023-08-21 DIAGNOSIS — Z171 Estrogen receptor negative status [ER-]: Secondary | ICD-10-CM | POA: Insufficient documentation

## 2023-08-21 DIAGNOSIS — Z170 Estrogen receptor positive status [ER+]: Secondary | ICD-10-CM | POA: Diagnosis not present

## 2023-08-21 LAB — CBC WITH DIFFERENTIAL/PLATELET
Abs Immature Granulocytes: 0.02 10*3/uL (ref 0.00–0.07)
Basophils Absolute: 0 10*3/uL (ref 0.0–0.1)
Basophils Relative: 0 %
Eosinophils Absolute: 0.1 10*3/uL (ref 0.0–0.5)
Eosinophils Relative: 1 %
HCT: 34.4 % — ABNORMAL LOW (ref 36.0–46.0)
Hemoglobin: 11.7 g/dL — ABNORMAL LOW (ref 12.0–15.0)
Immature Granulocytes: 1 %
Lymphocytes Relative: 36 %
Lymphs Abs: 1.3 10*3/uL (ref 0.7–4.0)
MCH: 29.2 pg (ref 26.0–34.0)
MCHC: 34 g/dL (ref 30.0–36.0)
MCV: 85.8 fL (ref 80.0–100.0)
Monocytes Absolute: 0.5 10*3/uL (ref 0.1–1.0)
Monocytes Relative: 14 %
Neutro Abs: 1.8 10*3/uL (ref 1.7–7.7)
Neutrophils Relative %: 48 %
Platelets: 146 10*3/uL — ABNORMAL LOW (ref 150–400)
RBC: 4.01 MIL/uL (ref 3.87–5.11)
RDW: 13.5 % (ref 11.5–15.5)
WBC: 3.7 10*3/uL — ABNORMAL LOW (ref 4.0–10.5)
nRBC: 0 % (ref 0.0–0.2)

## 2023-08-21 LAB — COMPREHENSIVE METABOLIC PANEL
ALT: 18 U/L (ref 0–44)
AST: 22 U/L (ref 15–41)
Albumin: 3.6 g/dL (ref 3.5–5.0)
Alkaline Phosphatase: 37 U/L — ABNORMAL LOW (ref 38–126)
Anion gap: 10 (ref 5–15)
BUN: 12 mg/dL (ref 6–20)
CO2: 25 mmol/L (ref 22–32)
Calcium: 8.7 mg/dL — ABNORMAL LOW (ref 8.9–10.3)
Chloride: 102 mmol/L (ref 98–111)
Creatinine, Ser: 0.7 mg/dL (ref 0.44–1.00)
GFR, Estimated: >60 mL/min (ref >60)
Glucose, Bld: 107 mg/dL — ABNORMAL HIGH (ref 70–99)
Potassium: 3.1 mmol/L — ABNORMAL LOW (ref 3.5–5.1)
Sodium: 137 mmol/L (ref 135–145)
Total Bilirubin: 1.1 mg/dL (ref 0.0–1.2)
Total Protein: 6.8 g/dL (ref 6.5–8.1)

## 2023-08-21 LAB — VITAMIN D 25 HYDROXY (VIT D DEFICIENCY, FRACTURES): Vit D, 25-Hydroxy: 39.74 ng/mL (ref 30–100)

## 2023-08-21 MED ORDER — HEPARIN SOD (PORK) LOCK FLUSH 100 UNIT/ML IV SOLN
500.0000 [IU] | Freq: Once | INTRAVENOUS | Status: AC
  Administered 2023-08-21: 500 [IU] via INTRAVENOUS

## 2023-08-21 MED ORDER — PEG 3350-KCL-NA BICARB-NACL 420 G PO SOLR: 4000.0000 mL | Freq: Once | ORAL | 0 refills | Status: AC

## 2023-08-21 MED ORDER — SODIUM CHLORIDE FLUSH 0.9 % IV SOLN
10.0000 mL | Freq: Once | INTRAVENOUS | Status: AC
  Administered 2023-08-21: 10 mL via INTRAVENOUS
  Filled 2023-08-21: qty 10

## 2023-08-21 NOTE — Telephone Encounter (Signed)
 Pt contacted and scheduled for 09/04/23. Instructions mailed to pt. Insurance does not require pre cert. Prep sent to pharmacy.

## 2023-08-21 NOTE — Addendum Note (Signed)
 Addended by: Marlowe Shores on: 08/21/2023 09:08 AM   Modules accepted: Orders

## 2023-08-21 NOTE — Telephone Encounter (Signed)
 Referral completed, TCS apt letter sent to PCP

## 2023-08-21 NOTE — Progress Notes (Signed)
 Port flushed with good blood return noted. No bruising or swelling at site. Bandaid applied and patient discharged in satisfactory condition. VVS stable with no signs or symptoms of distressed noted.

## 2023-08-22 ENCOUNTER — Encounter (HOSPITAL_COMMUNITY): Payer: Self-pay | Admitting: Hematology

## 2023-08-22 ENCOUNTER — Inpatient Hospital Stay: Payer: Medicaid Other

## 2023-08-22 ENCOUNTER — Inpatient Hospital Stay: Payer: No Typology Code available for payment source | Admitting: Hematology

## 2023-08-22 VITALS — BP 107/75 | HR 80 | Temp 98.0°F | Resp 18

## 2023-08-22 DIAGNOSIS — M8580 Other specified disorders of bone density and structure, unspecified site: Secondary | ICD-10-CM

## 2023-08-22 DIAGNOSIS — Z79811 Long term (current) use of aromatase inhibitors: Secondary | ICD-10-CM

## 2023-08-22 DIAGNOSIS — C50011 Malignant neoplasm of nipple and areola, right female breast: Secondary | ICD-10-CM

## 2023-08-22 LAB — CANCER ANTIGEN 15-3: CA 15-3: 17.6 U/mL (ref 0.0–25.0)

## 2023-08-22 MED ORDER — DENOSUMAB 60 MG/ML ~~LOC~~ SOSY
60.0000 mg | PREFILLED_SYRINGE | Freq: Once | SUBCUTANEOUS | Status: AC
  Administered 2023-08-22: 60 mg via SUBCUTANEOUS
  Filled 2023-08-22: qty 1

## 2023-08-22 NOTE — Progress Notes (Signed)
 Presents today fro Prolia injection per providers order.  Vital signs WNL.  Patient has been taking Calcium/Vitamin D supplements has had no Prior or up coming dental work and no jaw pain.  Stable during administration without incident; injection site WNL; see MAR for injection details.  Patient tolerated procedure well and without incident.  No questions or complaints noted at this time.

## 2023-08-22 NOTE — Patient Instructions (Signed)
 [facility] - A DEPT OF [facility]  Discharge Instructions: Thank you for choosing [facility] to provide your oncology and hematology care.  If you have a lab appointment with the Cancer Center - please note that after April 8th, 2024, all labs will be drawn in the cancer center.  You do not have to check in or register with the main entrance as you have in the past but will complete your check-in in the cancer center.  Wear comfortable clothing and clothing appropriate for easy access to any Portacath or PICC line.   We strive to give you quality time with your provider. You may need to reschedule your appointment if you arrive late (15 or more minutes).  Arriving late affects you and other patients whose appointments are after yours.  Also, if you miss three or more appointments without notifying the office, you may be dismissed from the clinic at the provider's discretion.      For prescription refill requests, have your pharmacy contact our office and allow 72 hours for refills to be completed.    Today you received the following chemotherapy and/or immunotherapy agents Prolia      To help prevent nausea and vomiting after your treatment, we encourage you to take your nausea medication as directed.  BELOW ARE SYMPTOMS THAT SHOULD BE REPORTED IMMEDIATELY: *FEVER GREATER THAN 100.4 F (38 C) OR HIGHER *CHILLS OR SWEATING *NAUSEA AND VOMITING THAT IS NOT CONTROLLED WITH YOUR NAUSEA MEDICATION *UNUSUAL SHORTNESS OF BREATH *UNUSUAL BRUISING OR BLEEDING *URINARY PROBLEMS (pain or burning when urinating, or frequent urination) *BOWEL PROBLEMS (unusual diarrhea, constipation, pain near the anus) TENDERNESS IN MOUTH AND THROAT WITH OR WITHOUT PRESENCE OF ULCERS (sore throat, sores in mouth, or a toothache) UNUSUAL RASH, SWELLING OR PAIN  UNUSUAL VAGINAL DISCHARGE OR ITCHING   Items with * indicate a potential emergency and should be followed up as  soon as possible or go to the Emergency Department if any problems should occur.  Please show the CHEMOTHERAPY ALERT CARD or IMMUNOTHERAPY ALERT CARD at check-in to the Emergency Department and triage nurse.  Should you have questions after your visit or need to cancel or reschedule your appointment, please contact [facility] [place] - A DEPT OF Eligha Brideg[facility] [phone]  and follow the prompts.  Office hours are 8:00 a.m. to 4:30 p.m. Monday - Friday. Please note that voicemails left after 4:00 p.m. may not be returned until the following business day.  We are closed weekends and major holidays. You have access to a nurse at all times for urgent questions. Please call the main number to the clinic [phone] and follow the prompts.  For any non-urgent questions, you may also contact your provider using MyChart. We now offer e-Visits for anyone 43 and older to request care online for non-urgent symptoms. For details visit [email or web address].   Also download the MyChart app! Go to the app store, search "MyChart", open the app, select [place], and log in with your MyChart username and password.

## 2023-08-22 NOTE — Progress Notes (Incomplete)
 [facility] [address], [place] [address]    Clinic Day:  02/07/2023  Referring physician: Toma Deiters, MD  Patient Care Team: Toma Deiters, MD as PCP - General (Internal Medicine)   ASSESSMENT & PLAN:   Assessment: 1.  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  She had cancer of the right breast in 2017 and had lymph node dissection.  Surgeries were done in [place]. - Adjuvant chemotherapy with dose dense AC followed by Taxol from 08/15/2015 through 11/22/2015, XRT completed on 01/27/2016. -Femara started in August 2017, switched to anastrozole around August 2018 for joint pains which have slightly improved. - TAH and BSO in 2016 prophylactically for her BRCA1 positivity. - Bone scan on 12/16/2018 did not show any evidence of metastatic disease.   2.  Family history: -Mother had breast cancer at age 39, sister who had breast cancer at age 49, maternal aunt breast cancer at age 47, father died of metastatic prostate cancer.  Paternal grandmother had metastatic cervical cancer.   3.  Osteopenia: -DEXA scan on 04/23/2018 shows T score of -1.6. -She was started on Prolia every 6 months in December 2017. - DEXA scan 05/31/2020: T-score -1.7, osteopenia.  Compared to 04/23/2018, slight decrease in density.  Plan: 1.  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 - She had breast MRI in February followed by biopsy of the 8:00 region mass in the left breast.  This was benign. - Physical exam today: No palpable suspicious masses at the implant sites. - Labs today: Normal LFTs and creatinine.  CBC grossly normal.  CA 15-3 was 14.9. - RTC after breast MRI in February next year.  Will plan repeating labs.      2.  Osteopenia: - Continue vitamin D supplements. - Calcium today is normal at 9.2.  She will receive Prolia today. - Will plan on repeating DEXA scan after next visit.  3.  B12 deficiency: - Continue monthly B12 injections.   Breast Cancer therapy associated bone loss: I have recommended calcium, Vitamin D and weight bearing exercises.   No orders of the defined types were placed in this encounter.     Alben Deeds Teague,acting as a Neurosurgeon for Doreatha Massed, MD.,have documented all relevant documentation on the behalf of Doreatha Massed, MD,as directed by  Doreatha Massed, MD while in the presence of Doreatha Massed, MD.  ***   Meyer R Teague   2/27/20258:17 AM  CHIEF COMPLAINT:   Diagnosis: Malignant neoplasm of lower-outer quadrant of right breast of female, estrogen receptor positive    Cancer Staging  No matching staging information was found for the patient.    Prior Therapy: Surgery followed by chemotherapy  Current Therapy: Anastrozole   HISTORY OF PRESENT ILLNESS:   Oncology History  Breast cancer (HCC)  05/06/2015 Initial Biopsy   Incisional biopsy with Dr. Hulan Fray of palpable mass, lower outer quadrant of reconstructed R breast close to nipple ER+ 97%, PR+ 70 (weak) HER 2 - by [facility]   07/06/2015 Surgery   Definitive lumpectomy and LN dissection performed revealing a 3.1 cm primary 1/14 positive LN with a macro metastases, satellite nodules adjacent to the primary, LVI-, however she had skin invasion along with satellite nodules, T4b N1a, Stage IIIB, grade IIII,   07/07/2015  Imaging   Per records negative CT C/A/P and bone scan   08/15/2015 - 11/22/2015 Chemotherapy   AC x 4, Taxol X 4 both given dose dense    - 01/27/2016 Radiation Therapy   Dr. Pamelia Hoit. Michell Heinrich   02/06/2016 Imaging   Bone density- BMD as determined from [facility] L1-L4 is 1.021 g/cm2 with a T-Score of -1.3. This patient is considered osteopenic according to World Health  Organization [facility]) criteria.       INTERVAL HISTORY:   Carly [name] is a 43 y.o. female presenting to clinic today for follow up of right breast cancer. She was last seen by me on 02/07/23.  Guerline has a colonoscopy scheduled for 09/04/23 with Dr. Tasia Catchings.   Today, she states that she is doing well overall. Her appetite level is at ***%. Her energy level is at ***%.  PAST MEDICAL HISTORY:   Past Medical History: Past Medical History:  Diagnosis Date   Anxiety    Breast cancer (HCC) 06/26/2011   BRCA1 positive   Neuropathy    Osteopenia     Surgical History: Past Surgical History:  Procedure Laterality Date   ABDOMINAL HYSTERECTOMY     BREAST BIOPSY Left 08/27/2022   [place] LT BREAST BX W LOC DEV 1ST LESION IMG BX SPEC US GUIDE 08/27/2022 [facility]   BREAST SURGERY     CESAREAN SECTION     MASTECTOMY      Social History: Social History   Socioeconomic History   Marital status: Single    Spouse name: Not on file   Number of children: Not on file   Years of education: Not on file   Highest education level: Not on file  Occupational History   Not on file  Tobacco Use   Smoking status: Never   Smokeless tobacco: Never  Substance and Sexual Activity   Alcohol use: Yes    Comment: occ   Drug use: No   Sexual activity: Not on file  Other Topics Concern   Not on file  Social History Narrative   Not on file   Social Drivers of Health   Financial Resource Strain: Low Risk  (11/02/2022)   [facility], [facility]   Overall Financial Resource Strain (CARDIA)    Difficulty of Paying Living Expenses: Not very hard  Food Insecurity: No Food Insecurity (11/02/2022)   [facility], [facility]   Hunger Vital Sign    Worried About Running Out of Food in the Last Year: Never true    Ran Out of Food in the Last Year: Never true  Transportation Needs: No Transportation Needs (08/21/2022)   PRAPARE - Scientist, research (physical sciences) (Medical): No    Lack of Transportation (Non-Medical): No  Physical Activity: Not on file  Stress: Not on file  Social Connections: Not on file  Intimate Partner Violence: Not At Risk (08/30/2022)   [facility] from [facility], [facility]   Humiliation, Afraid, Rape, and Kick questionnaire    Fear of Current or Ex-Partner: No    Emotionally Abused: No    Physically Abused: No    Sexually Abused: No    Family History: Family History  Problem Relation Age of Onset   Breast cancer Mother 53   BRCA 1/2 Mother    Prostate cancer Father        metastatic   Breast cancer Sister 72       BRCA1  mutation   Breast cancer Maternal Aunt        dx in her 63s; BRCA1 mutation   Heart attack Maternal Grandfather    Ovarian cancer Paternal Grandmother        dx in her 70s   Heart attack Paternal Grandfather    Healthy Sister    BRCA 1/2 Maternal Aunt    Healthy Maternal Aunt        negative for a BRCA mutation   Healthy Maternal Aunt        negative for a BRCA mutation    Current Medications:  Current Outpatient Medications:    ALPRAZolam (XANAX) 0.5 MG tablet, TAKE 1 TABLET BY MOUTH AT BEDTIME AS NEEDED FOR ANXIETY, Disp: 30 tablet, Rfl: 4   anastrozole (ARIMIDEX) 1 MG tablet, TAKE 1 TABLET BY MOUTH ONCE DAILY, Disp: 30 tablet, Rfl: 6   ascorbic acid (VITAMIN C) 100 MG tablet, Take 100 mg by mouth daily., Disp: , Rfl:    Calcium-Magnesium-Vitamin D (CALCIUM 500 PO), Take 1 tablet by mouth daily., Disp: , Rfl:    cyanocobalamin (VITAMIN B12) 1000 MCG/ML injection, INJECT 1 ML IN THE MUSCLE EVERY 30 DAYS, Disp: 3 mL, Rfl: 6   denosumab (PROLIA) 60 MG/ML SOSY injection, Inject 60 mg into the skin every 6 (six) months., Disp: , Rfl:    docusate sodium (COLACE) 100 MG capsule, Take 100 mg by mouth daily as needed for mild constipation. (Patient not taking: Reported on 08/14/2023), Disp: , Rfl:    DULoxetine (CYMBALTA) 30 MG capsule, Take 30 mg by mouth daily. , Disp: ,  Rfl: 1   DULoxetine (CYMBALTA) 60 MG capsule, Take 1 capsule (60 mg total) by mouth daily., Disp: 90 capsule, Rfl: 3   gabapentin (NEURONTIN) 600 MG tablet, Take 600 mg by mouth at bedtime., Disp: , Rfl: 1   HYDROcodone-acetaminophen (NORCO) 10-325 MG tablet, Take 1 tablet by mouth every 4 (four) hours as needed. (Patient taking differently: Take 1 tablet by mouth every 4 (four) hours as needed for moderate pain (pain score 4-6).), Disp: 10 tablet, Rfl: 0   Misc. Devices MISC, Please provide patient with a compression therapy pump fot her Right arm lymphedema., Disp: 1 each, Rfl: 0   Multiple Vitamins-Minerals (MULTIVITAMIN ADULT PO), Take 1 tablet by mouth daily. (Patient not taking: Reported on 08/14/2023), Disp: , Rfl:    potassium chloride SA (KLOR-CON M) 20 MEQ tablet, Take 2 tablets (40 mEq total) by mouth daily for 3 days., Disp: 6 tablet, Rfl: 0   promethazine (PHENERGAN) 12.5 MG tablet, Take 1 tablet (12.5 mg total) by mouth every 4 (four) hours as needed for nausea or vomiting. (Patient not taking: Reported on 08/14/2023), Disp: 30 tablet, Rfl: 0   valACYclovir (VALTREX) 1000 MG tablet, Take 2,000 mg by mouth 2 (two) times daily as needed (cold sores). (Patient not taking: Reported on 08/14/2023), Disp: , Rfl:    Allergies: Allergies  Allergen Reactions   Latex Rash   Other Rash    Oozing blistery rash   Sumatriptan Other (See Comments)    Other reaction(s): Unknown Numbness of face and arms   Tape Rash    Oozing blistery rash    REVIEW OF SYSTEMS:   Review of Systems  Constitutional:  Negative for chills, fatigue and fever.  HENT:   Negative for lump/mass, mouth sores, nosebleeds, sore throat and trouble swallowing.   Eyes:  Negative for eye problems.  Respiratory:  Negative for cough and shortness of breath.  Cardiovascular:  Negative for chest pain, leg swelling and palpitations.  Gastrointestinal:  Negative for abdominal pain, constipation, diarrhea, nausea and vomiting.   Genitourinary:  Negative for bladder incontinence, difficulty urinating, dysuria, frequency, hematuria and nocturia.   Musculoskeletal:  Negative for arthralgias, back pain, flank pain, myalgias and neck pain.  Skin:  Negative for itching and rash.  Neurological:  Negative for dizziness, headaches and numbness.  Hematological:  Does not bruise/bleed easily.  Psychiatric/Behavioral:  Negative for depression, sleep disturbance and suicidal ideas. The patient is not nervous/anxious.   All other systems reviewed and are negative.    VITALS:   There were no vitals taken for this visit.  Wt Readings from Last 3 Encounters:  02/07/23 280 lb 1.6 oz (127.1 kg)  08/20/22 275 lb (124.7 kg)  08/02/22 279 lb 9.6 oz (126.8 kg)    There is no height or weight on file to calculate BMI.  Performance status (ECOG): 1 - Symptomatic but completely ambulatory  PHYSICAL EXAM:   Physical Exam Vitals and nursing note reviewed. Exam conducted with a chaperone present.  Constitutional:      Appearance: Normal appearance.  Cardiovascular:     Rate and Rhythm: Normal rate and regular rhythm.     Pulses: Normal pulses.     Heart sounds: Normal heart sounds.  Pulmonary:     Effort: Pulmonary effort is normal.     Breath sounds: Normal breath sounds.  Abdominal:     Palpations: Abdomen is soft. There is no hepatomegaly, splenomegaly or mass.     Tenderness: There is no abdominal tenderness.  Musculoskeletal:     Right lower leg: No edema.     Left lower leg: No edema.  Lymphadenopathy:     Cervical: No cervical adenopathy.     Right cervical: No superficial, deep or posterior cervical adenopathy.    Left cervical: No superficial, deep or posterior cervical adenopathy.     Upper Body:     Right upper body: No supraclavicular or axillary adenopathy.     Left upper body: No supraclavicular or axillary adenopathy.  Neurological:     General: No focal deficit present.     Mental Status: She is alert  and oriented to person, place, and time.  Psychiatric:        Mood and Affect: Mood normal.        Behavior: Behavior normal.     LABS:      Latest Ref Rng & Units 08/21/2023    2:39 PM 02/05/2023   12:07 PM 08/21/2022    4:10 AM  CBC  WBC 4.0 - 10.5 K/uL 3.7  4.6  10.2   Hemoglobin 12.0 - 15.0 g/dL 96.2  95.2  84.1   Hematocrit 36.0 - 46.0 % 34.4  33.5  33.5   Platelets 150 - 400 K/uL 146  163  132       Latest Ref Rng & Units 08/21/2023    2:39 PM 02/05/2023   12:07 PM 08/22/2022    7:36 AM  CMP  Glucose 70 - 99 mg/dL 324  401  027   BUN 6 - 20 mg/dL 12  14  12    Creatinine 0.44 - 1.00 mg/dL 2.53  6.64  4.03   Sodium 135 - 145 mmol/L 137  135  135   Potassium 3.5 - 5.1 mmol/L 3.1  3.6  3.3   Chloride 98 - 111 mmol/L 102  101  105   CO2 22 - 32 mmol/L 25  27  20   Calcium 8.9 - 10.3 mg/dL 8.7  9.2  7.8   Total Protein 6.5 - 8.1 g/dL 6.8  6.8    Total Bilirubin 0.0 - 1.2 mg/dL 1.1  0.5    Alkaline Phos 38 - 126 U/L 37  42    AST 15 - 41 U/L 22  15    ALT 0 - 44 U/L 18  14       No results found for: "CEA1", "CEA" / No results found for: "CEA1", "CEA" No results found for: "PSA1" No results found for: "VOZ366" No results found for: "CAN125"  No results found for: "TOTALPROTELP", "ALBUMINELP", "A1GS", "A2GS", "BETS", "BETA2SER", "GAMS", "MSPIKE", "SPEI" Lab Results  Component Value Date   TIBC 352 07/24/2021   TIBC 294 10/11/2020   TIBC 325 04/08/2020   FERRITIN 68 07/24/2021   FERRITIN 116 10/11/2020   FERRITIN 121 04/08/2020   IRONPCTSAT 29 07/24/2021   IRONPCTSAT 21 10/11/2020   IRONPCTSAT 21 04/08/2020   Lab Results  Component Value Date   LDH 155 07/24/2021   LDH 170 10/11/2020   LDH 168 04/08/2020     STUDIES:   No results found.

## 2023-08-26 ENCOUNTER — Inpatient Hospital Stay: Payer: No Typology Code available for payment source | Attending: Hematology | Admitting: Hematology

## 2023-08-26 ENCOUNTER — Encounter: Payer: Self-pay | Admitting: Hematology

## 2023-08-26 VITALS — BP 123/70 | HR 100 | Temp 98.0°F | Resp 18 | Wt 271.2 lb

## 2023-08-26 DIAGNOSIS — Z79811 Long term (current) use of aromatase inhibitors: Secondary | ICD-10-CM | POA: Diagnosis not present

## 2023-08-26 DIAGNOSIS — Z803 Family history of malignant neoplasm of breast: Secondary | ICD-10-CM | POA: Insufficient documentation

## 2023-08-26 DIAGNOSIS — Z79899 Other long term (current) drug therapy: Secondary | ICD-10-CM | POA: Insufficient documentation

## 2023-08-26 DIAGNOSIS — D72819 Decreased white blood cell count, unspecified: Secondary | ICD-10-CM | POA: Insufficient documentation

## 2023-08-26 DIAGNOSIS — D696 Thrombocytopenia, unspecified: Secondary | ICD-10-CM | POA: Insufficient documentation

## 2023-08-26 DIAGNOSIS — C50011 Malignant neoplasm of nipple and areola, right female breast: Secondary | ICD-10-CM | POA: Diagnosis not present

## 2023-08-26 DIAGNOSIS — Z1501 Genetic susceptibility to malignant neoplasm of breast: Secondary | ICD-10-CM | POA: Insufficient documentation

## 2023-08-26 DIAGNOSIS — Z170 Estrogen receptor positive status [ER+]: Secondary | ICD-10-CM | POA: Insufficient documentation

## 2023-08-26 DIAGNOSIS — Z9221 Personal history of antineoplastic chemotherapy: Secondary | ICD-10-CM | POA: Insufficient documentation

## 2023-08-26 DIAGNOSIS — E538 Deficiency of other specified B group vitamins: Secondary | ICD-10-CM | POA: Insufficient documentation

## 2023-08-26 DIAGNOSIS — C50012 Malignant neoplasm of nipple and areola, left female breast: Secondary | ICD-10-CM

## 2023-08-26 DIAGNOSIS — Z9013 Acquired absence of bilateral breasts and nipples: Secondary | ICD-10-CM | POA: Insufficient documentation

## 2023-08-26 DIAGNOSIS — M8580 Other specified disorders of bone density and structure, unspecified site: Secondary | ICD-10-CM | POA: Diagnosis not present

## 2023-08-26 DIAGNOSIS — C50511 Malignant neoplasm of lower-outer quadrant of right female breast: Secondary | ICD-10-CM | POA: Diagnosis not present

## 2023-08-26 DIAGNOSIS — Z923 Personal history of irradiation: Secondary | ICD-10-CM | POA: Diagnosis not present

## 2023-08-26 NOTE — Progress Notes (Signed)
 [facility] [address], [place] [address]    Clinic Day:  08/26/23   Referring physician: Toma Deiters, MD  Patient Care Team: Toma Deiters, MD as PCP - General (Internal Medicine)   ASSESSMENT & PLAN:   Assessment: 1.  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  She had cancer of the right breast in 2017 and had lymph node dissection.  Surgeries were done in [place]. - Adjuvant chemotherapy with dose dense AC followed by Taxol from 08/15/2015 through 11/22/2015, XRT completed on 01/27/2016. -Femara started in August 2017, switched to anastrozole around August 2018 for joint pains which have slightly improved. - TAH and BSO in 2016 prophylactically for her BRCA1 positivity. - Bone scan on 12/16/2018 did not show any evidence of metastatic disease.   2.  Family history: -Mother had breast cancer at age 39, sister who had breast cancer at age 25, maternal aunt breast cancer at age 17, father died of metastatic prostate cancer.  Paternal grandmother had metastatic cervical cancer.   3.  Osteopenia: -DEXA scan on 04/23/2018 shows T score of -1.6. -She was started on Prolia every 6 months in December 2017. - DEXA scan 05/31/2020: T-score -1.7, osteopenia.  Compared to 04/23/2018, slight decrease in density.  Plan: 1.  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 - She reports achy pains in the bones, predominantly in the legs and occasionally in the arms since she started taking aromatase inhibitors. - Physical exam: No palpable adenopathy. - Labs from 08/21/2023: LFTs normal.  Mild leukopenia, hypokalemia and thrombocytopenia likely from infection she had prior to labs.  CA 15-3 was normal. - Will  schedule yearly MRI of the breasts.  RTC 6 months for follow-up.     2.  Osteopenia: - Continue vitamin D supplements.  Vitamin D level is normal at 39. - She received Prolia on 08/22/2023.  Calcium is 8.7. - Will plan on repeating DEXA scan prior to next visit in 6 months.  3.  B12 deficiency: - Continue monthly B12 injections.   Breast Cancer therapy associated bone loss: I have recommended calcium, Vitamin D and weight bearing exercises.   Orders Placed This Encounter  Procedures   DG Bone Density    Standing Status:   Future    Expiration Date:   08/25/2024    Reason for Exam (SYMPTOM  OR DIAGNOSIS REQUIRED):   anti-estrogen therapy    Is patient pregnant?:   No    Preferred imaging location?:   [facility],acting as a scribe for Doreatha Massed, MD.,have documented all relevant documentation on the behalf of Doreatha Massed, MD,as directed by  Doreatha Massed, MD while in the presence of Doreatha Massed, MD.  I, Doreatha Massed MD, have reviewed the above documentation for accuracy and completeness, and I agree with the above.    Doreatha Massed, MD   3/3/20254:10 PM  CHIEF COMPLAINT:   Diagnosis: Malignant neoplasm of lower-outer quadrant of right breast of female, estrogen receptor positive    Cancer Staging  No matching staging information was found for the patient.    Prior Therapy: Surgery followed by chemotherapy  Current Therapy: Anastrozole   HISTORY OF PRESENT ILLNESS:   Oncology History  Breast cancer (HCC)  05/06/2015 Initial Biopsy   Incisional biopsy with Dr. Hulan Fray of palpable mass, lower outer quadrant of reconstructed R breast close to nipple ER+ 97%, PR+ 70 (weak) HER 2 - by [facility]   07/06/2015 Surgery   Definitive lumpectomy and LN dissection performed revealing a 3.1 cm primary 1/14 positive LN with a macro metastases, satellite nodules adjacent to the primary, LVI-, however she had skin  invasion along with satellite nodules, T4b N1a, Stage IIIB, grade IIII,   07/07/2015 Imaging   Per records negative CT C/A/P and bone scan   08/15/2015 - 11/22/2015 Chemotherapy   AC x 4, Taxol X 4 both given dose dense    - 01/27/2016 Radiation Therapy   Dr. Pamelia Hoit. Michell Heinrich   02/06/2016 Imaging   Bone density- BMD as determined from [facility] L1-L4 is 1.021 g/cm2 with a T-Score of -1.3. This patient is considered osteopenic according to World Health Organization [facility]) criteria.       INTERVAL HISTORY:   Sheza is a 43 y.o. female presenting to clinic today for follow up of right breast cancer. She was last seen by me on 02/07/23.  Of note, she has a colonoscopy scheduled for 09/04/23 with Dr. Tasia Catchings.   Today, she states that she is doing well overall. Her appetite level is at 100%. Her energy level is at 10%.  PAST MEDICAL HISTORY:   Past Medical History: Past Medical History:  Diagnosis Date   Anxiety    Breast cancer (HCC) 06/26/2011   BRCA1 positive   Neuropathy    Osteopenia     Surgical History: Past Surgical History:  Procedure Laterality Date   ABDOMINAL HYSTERECTOMY     BREAST BIOPSY Left 08/27/2022   [place] LT BREAST BX W LOC DEV 1ST LESION IMG BX SPEC US GUIDE 08/27/2022 [facility]   BREAST SURGERY     CESAREAN SECTION     MASTECTOMY      Social History: Social History   Socioeconomic History   Marital status: Single    Spouse name: Not on file   Number of children: Not on file   Years of education: Not on file   Highest education level: Not on file  Occupational History   Not on file  Tobacco Use   Smoking status: Never   Smokeless tobacco: Never  Substance and Sexual Activity   Alcohol use: Yes    Comment: occ   Drug use: No   Sexual activity: Not on file  Other Topics Concern   Not on file  Social History Narrative   Not on file   Social Drivers of Health   Financial Resource Strain: Low Risk  (11/02/2022)   [facility]   Overall Financial Resource Strain (CARDIA)    Difficulty of Paying Living Expenses: Not very hard  Food Insecurity: No Food Insecurity (11/02/2022)   [facility], [facility]   Hunger Vital Sign    Worried About Running Out of Food in the Last Year: Never true    Ran Out of Food in the Last Year: Never true  Transportation Needs: No Transportation Needs (08/21/2022)   PRAPARE - Administrator, Civil Service (Medical): No    Lack of Transportation (Non-Medical): No  Physical Activity: Not on file  Stress: Not on file  Social Connections: Not on file  Intimate Partner Violence: Not At Risk (08/30/2022)   [facility], [facility]  Humiliation, Afraid, Rape, and Kick questionnaire    Fear of Current or Ex-Partner: No    Emotionally Abused: No    Physically Abused: No    Sexually Abused: No    Family History: Family History  Problem Relation Age of Onset   Breast cancer Mother 9   BRCA 1/2 Mother    Prostate cancer Father        metastatic   Breast cancer Sister 87       BRCA1 mutation   Breast cancer Maternal Aunt        dx in her 74s; BRCA1 mutation   Heart attack Maternal Grandfather    Ovarian cancer Paternal Grandmother        dx in her 15s   Heart attack Paternal Grandfather    Healthy Sister    BRCA 1/2 Maternal Aunt    Healthy Maternal Aunt        negative for a BRCA mutation   Healthy Maternal Aunt        negative for a BRCA mutation    Current Medications:  Current Outpatient Medications:    ALPRAZolam (XANAX) 0.5 MG tablet, TAKE 1 TABLET BY MOUTH AT BEDTIME AS NEEDED FOR ANXIETY, Disp: 30 tablet, Rfl: 4   anastrozole (ARIMIDEX) 1 MG tablet, TAKE 1 TABLET BY MOUTH ONCE DAILY, Disp: 30 tablet, Rfl: 6   ascorbic acid (VITAMIN C) 100 MG tablet, Take 100 mg by mouth daily., Disp: , Rfl:    Calcium-Magnesium-Vitamin D (CALCIUM 500 PO), Take 1 tablet by mouth daily., Disp: , Rfl:     cyanocobalamin (VITAMIN B12) 1000 MCG/ML injection, INJECT 1 ML IN THE MUSCLE EVERY 30 DAYS, Disp: 3 mL, Rfl: 6   denosumab (PROLIA) 60 MG/ML SOSY injection, Inject 60 mg into the skin every 6 (six) months., Disp: , Rfl:    docusate sodium (COLACE) 100 MG capsule, Take 100 mg by mouth daily as needed for mild constipation. (Patient not taking: Reported on 08/14/2023), Disp: , Rfl:    DULoxetine (CYMBALTA) 30 MG capsule, Take 30 mg by mouth daily. , Disp: , Rfl: 1   DULoxetine (CYMBALTA) 60 MG capsule, Take 1 capsule (60 mg total) by mouth daily., Disp: 90 capsule, Rfl: 3   gabapentin (NEURONTIN) 600 MG tablet, Take 600 mg by mouth at bedtime., Disp: , Rfl: 1   HYDROcodone-acetaminophen (NORCO) 10-325 MG tablet, Take 1 tablet by mouth every 4 (four) hours as needed. (Patient taking differently: Take 1 tablet by mouth every 4 (four) hours as needed for moderate pain (pain score 4-6).), Disp: 10 tablet, Rfl: 0   Misc. Devices MISC, Please provide patient with a compression therapy pump fot her Right arm lymphedema., Disp: 1 each, Rfl: 0   Multiple Vitamins-Minerals (MULTIVITAMIN ADULT PO), Take 1 tablet by mouth daily. (Patient not taking: Reported on 08/14/2023), Disp: , Rfl:    potassium chloride SA (KLOR-CON M) 20 MEQ tablet, Take 2 tablets (40 mEq total) by mouth daily for 3 days., Disp: 6 tablet, Rfl: 0   promethazine (PHENERGAN) 12.5 MG tablet, Take 1 tablet (12.5 mg total) by mouth every 4 (four) hours as needed for nausea or vomiting. (Patient not taking: Reported on 08/14/2023), Disp: 30 tablet, Rfl: 0   valACYclovir (VALTREX) 1000 MG tablet, Take 2,000 mg by mouth 2 (two) times daily as needed (cold sores). (Patient not taking: Reported on 08/14/2023), Disp: , Rfl:    Allergies: Allergies  Allergen Reactions   Latex Rash   Other Rash  Oozing blistery rash   Sumatriptan Other (See Comments)    Other reaction(s): Unknown Numbness of face and arms   Tape Rash    Oozing blistery rash     REVIEW OF SYSTEMS:   Review of Systems  Constitutional:  Negative for chills, fatigue and fever.  HENT:   Negative for lump/mass, mouth sores, nosebleeds, sore throat and trouble swallowing.   Eyes:  Negative for eye problems.  Respiratory:  Negative for cough and shortness of breath.   Cardiovascular:  Negative for chest pain, leg swelling and palpitations.  Gastrointestinal:  Negative for abdominal pain, constipation, diarrhea, nausea and vomiting.  Genitourinary:  Negative for bladder incontinence, difficulty urinating, dysuria, frequency, hematuria and nocturia.   Musculoskeletal:  Positive for arthralgias. Negative for back pain, flank pain, myalgias and neck pain.  Skin:  Negative for itching and rash.  Neurological:  Negative for dizziness, headaches and numbness.  Hematological:  Does not bruise/bleed easily.  Psychiatric/Behavioral:  Negative for depression, sleep disturbance and suicidal ideas. The patient is not nervous/anxious.   All other systems reviewed and are negative.    VITALS:   Blood pressure 123/70, pulse 100, temperature 98 F (36.7 C), temperature source Oral, resp. rate 18, weight 271 lb 2.7 oz (123 kg), SpO2 100%.  Wt Readings from Last 3 Encounters:  08/26/23 271 lb 2.7 oz (123 kg)  02/07/23 280 lb 1.6 oz (127.1 kg)  08/20/22 275 lb (124.7 kg)    Body mass index is 42.47 kg/m.  Performance status (ECOG): 1 - Symptomatic but completely ambulatory  PHYSICAL EXAM:   Physical Exam Vitals and nursing note reviewed. Exam conducted with a chaperone present.  Constitutional:      Appearance: Normal appearance.  Cardiovascular:     Rate and Rhythm: Normal rate and regular rhythm.     Pulses: Normal pulses.     Heart sounds: Normal heart sounds.  Pulmonary:     Effort: Pulmonary effort is normal.     Breath sounds: Normal breath sounds.  Abdominal:     Palpations: Abdomen is soft. There is no hepatomegaly, splenomegaly or mass.     Tenderness:  There is no abdominal tenderness.  Musculoskeletal:     Right lower leg: No edema.     Left lower leg: No edema.  Lymphadenopathy:     Cervical: No cervical adenopathy.     Right cervical: No superficial, deep or posterior cervical adenopathy.    Left cervical: No superficial, deep or posterior cervical adenopathy.     Upper Body:     Right upper body: No supraclavicular or axillary adenopathy.     Left upper body: No supraclavicular or axillary adenopathy.  Neurological:     General: No focal deficit present.     Mental Status: She is alert and oriented to person, place, and time.  Psychiatric:        Mood and Affect: Mood normal.        Behavior: Behavior normal.     LABS:      Latest Ref Rng & Units 08/21/2023    2:39 PM 02/05/2023   12:07 PM 08/21/2022    4:10 AM  CBC  WBC 4.0 - 10.5 K/uL 3.7  4.6  10.2   Hemoglobin 12.0 - 15.0 g/dL 09.8  11.9  14.7   Hematocrit 36.0 - 46.0 % 34.4  33.5  33.5   Platelets 150 - 400 K/uL 146  163  132       Latest Ref Rng &  Units 08/21/2023    2:39 PM 02/05/2023   12:07 PM 08/22/2022    7:36 AM  CMP  Glucose 70 - 99 mg/dL 191  478  295   BUN 6 - 20 mg/dL 12  14  12    Creatinine 0.44 - 1.00 mg/dL 6.21  3.08  6.57   Sodium 135 - 145 mmol/L 137  135  135   Potassium 3.5 - 5.1 mmol/L 3.1  3.6  3.3   Chloride 98 - 111 mmol/L 102  101  105   CO2 22 - 32 mmol/L 25  27  20    Calcium 8.9 - 10.3 mg/dL 8.7  9.2  7.8   Total Protein 6.5 - 8.1 g/dL 6.8  6.8    Total Bilirubin 0.0 - 1.2 mg/dL 1.1  0.5    Alkaline Phos 38 - 126 U/L 37  42    AST 15 - 41 U/L 22  15    ALT 0 - 44 U/L 18  14       No results found for: "CEA1", "CEA" / No results found for: "CEA1", "CEA" No results found for: "PSA1" No results found for: "QIO962" No results found for: "CAN125"  No results found for: "TOTALPROTELP", "ALBUMINELP", "A1GS", "A2GS", "BETS", "BETA2SER", "GAMS", "MSPIKE", "SPEI" Lab Results  Component Value Date   TIBC 352 07/24/2021   TIBC 294  10/11/2020   TIBC 325 04/08/2020   FERRITIN 68 07/24/2021   FERRITIN 116 10/11/2020   FERRITIN 121 04/08/2020   IRONPCTSAT 29 07/24/2021   IRONPCTSAT 21 10/11/2020   IRONPCTSAT 21 04/08/2020   Lab Results  Component Value Date   LDH 155 07/24/2021   LDH 170 10/11/2020   LDH 168 04/08/2020     STUDIES:   No results found.

## 2023-08-26 NOTE — Patient Instructions (Addendum)
 [facility] at [facility] Discharge Instructions   You were seen and examined today by Dr. Ellin Saba.  He reviewed the results of your lab work which are normal/stable.   We will refer you to [facility] for pain management.   We will see you back in 6 months.   Return as scheduled.    Thank you for choosing [facility] at [facility] to provide your oncology and hematology care.  To afford each patient quality time with our provider, please arrive at least 15 minutes before your scheduled appointment time.   If you have a lab appointment with the Cancer Center please come in thru the Main Entrance and check in at the main information desk.  You need to re-schedule your appointment should you arrive 10 or more minutes late.  We strive to give you quality time with our providers, and arriving late affects you and other patients whose appointments are after yours.  Also, if you no show three or more times for appointments you may be dismissed from the clinic at the providers discretion.     Again, thank you for choosing [facility].  Our hope is that these requests will decrease the amount of time that you wait before being seen by our physicians.       _____________________________________________________________  Should you have questions after your visit to [facility], please contact our office at [phone] and follow the prompts.  Our office hours are 8:00 a.m. and 4:30 p.m. Monday - Friday.  Please note that voicemails left after 4:00 p.m. may not be returned until the following business day.  We are closed weekends and major holidays.  You do have access to a nurse 24-7, just call the main number to the clinic [phone] and do not press any options, hold on the line and a nurse will answer the phone.    For prescription refill requests, have your pharmacy contact our office and  allow 72 hours.    Due to Covid, you will need to wear a mask upon entering the hospital. If you do not have a mask, a mask will be given to you at the Main Entrance upon arrival. For doctor visits, patients may have 1 support person age 64 or older with them. For treatment visits, patients can not have anyone with them due to social distancing guidelines and our immunocompromised population.

## 2023-08-29 ENCOUNTER — Encounter (HOSPITAL_COMMUNITY): Payer: Self-pay | Admitting: Hematology

## 2023-08-30 ENCOUNTER — Encounter (HOSPITAL_COMMUNITY): Payer: Self-pay | Admitting: Hematology

## 2023-09-03 ENCOUNTER — Telehealth (INDEPENDENT_AMBULATORY_CARE_PROVIDER_SITE_OTHER): Payer: Self-pay | Admitting: Gastroenterology

## 2023-09-03 NOTE — Telephone Encounter (Signed)
 Lorna Few, Kenney Houseman, LPN This pt LM that she needs to reschedule due to death in the family.  Please give her a call.  I have placed in the depot.  Thanks,

## 2023-09-04 ENCOUNTER — Encounter (HOSPITAL_COMMUNITY): Admission: RE | Payer: Self-pay | Source: Home / Self Care

## 2023-09-04 ENCOUNTER — Ambulatory Visit (HOSPITAL_COMMUNITY)
Admission: RE | Admit: 2023-09-04 | Payer: No Typology Code available for payment source | Source: Home / Self Care | Admitting: Gastroenterology

## 2023-09-04 SURGERY — COLONOSCOPY WITH PROPOFOL
Anesthesia: Choice

## 2023-09-05 ENCOUNTER — Encounter (INDEPENDENT_AMBULATORY_CARE_PROVIDER_SITE_OTHER): Payer: Self-pay

## 2023-09-05 NOTE — Telephone Encounter (Signed)
 ASA 1-2 Carly [name] TCS

## 2023-09-05 NOTE — Telephone Encounter (Signed)
 Attempted to reach pt but voicemail is full. Will send my chart message

## 2023-09-09 ENCOUNTER — Ambulatory Visit (HOSPITAL_COMMUNITY)
Admission: RE | Admit: 2023-09-09 | Discharge: 2023-09-09 | Disposition: A | Discharge status: Home / Self Care | Source: Ambulatory Visit | Attending: Hematology | Admitting: Hematology

## 2023-09-09 DIAGNOSIS — C50011 Malignant neoplasm of nipple and areola, right female breast: Secondary | ICD-10-CM

## 2023-09-09 DIAGNOSIS — C50012 Malignant neoplasm of nipple and areola, left female breast: Secondary | ICD-10-CM | POA: Insufficient documentation

## 2023-09-09 DIAGNOSIS — Z1239 Encounter for other screening for malignant neoplasm of breast: Secondary | ICD-10-CM | POA: Diagnosis not present

## 2023-09-09 MED ORDER — GADOBUTROL 1 MMOL/ML IV SOLN
10.0000 mL | Freq: Once | INTRAVENOUS | Status: AC | PRN
  Administered 2023-09-09: 10 mL via INTRAVENOUS

## 2023-09-11 ENCOUNTER — Ambulatory Visit (HOSPITAL_COMMUNITY)
Admission: RE | Admit: 2023-09-11 | Discharge: 2023-09-11 | Disposition: A | Discharge status: Home / Self Care | Source: Ambulatory Visit | Attending: Hematology | Admitting: Hematology

## 2023-09-11 DIAGNOSIS — Z90722 Acquired absence of ovaries, bilateral: Secondary | ICD-10-CM | POA: Diagnosis not present

## 2023-09-11 DIAGNOSIS — C50012 Malignant neoplasm of nipple and areola, left female breast: Secondary | ICD-10-CM | POA: Insufficient documentation

## 2023-09-11 DIAGNOSIS — M8589 Other specified disorders of bone density and structure, multiple sites: Secondary | ICD-10-CM | POA: Insufficient documentation

## 2023-09-11 DIAGNOSIS — M8580 Other specified disorders of bone density and structure, unspecified site: Secondary | ICD-10-CM | POA: Insufficient documentation

## 2023-09-11 DIAGNOSIS — C50011 Malignant neoplasm of nipple and areola, right female breast: Secondary | ICD-10-CM | POA: Diagnosis not present

## 2023-09-11 DIAGNOSIS — Z780 Asymptomatic menopausal state: Secondary | ICD-10-CM | POA: Insufficient documentation

## 2023-09-16 ENCOUNTER — Other Ambulatory Visit: Payer: Self-pay | Admitting: Hematology

## 2023-09-16 DIAGNOSIS — C50911 Malignant neoplasm of unspecified site of right female breast: Secondary | ICD-10-CM

## 2023-09-16 DIAGNOSIS — Z1501 Genetic susceptibility to malignant neoplasm of breast: Secondary | ICD-10-CM

## 2023-10-24 ENCOUNTER — Other Ambulatory Visit: Payer: Self-pay | Admitting: Hematology

## 2023-10-24 DIAGNOSIS — C50911 Malignant neoplasm of unspecified site of right female breast: Secondary | ICD-10-CM

## 2023-11-03 DIAGNOSIS — M898X9 Other specified disorders of bone, unspecified site: Secondary | ICD-10-CM | POA: Insufficient documentation

## 2023-11-03 DIAGNOSIS — F1120 Opioid dependence, uncomplicated: Secondary | ICD-10-CM | POA: Insufficient documentation

## 2023-11-03 NOTE — Progress Notes (Unsigned)
 PROVIDER NOTE: Interpretation of information contained herein should be left to medically-trained personnel. Specific patient instructions are provided elsewhere under "Patient Instructions" section of medical record. This document was created in part using AI and STT-dictation technology, any transcriptional errors that may result from this process are unintentional.  Patient: Carly [name]  Service: E/M Encounter  Provider: Candi Chafe, MD  DOB: 1981-03-08  Delivery: Face-to-face  Specialty: Interventional Pain Management  MRN: [id number]  Setting: Ambulatory outpatient facility  Specialty designation: 09  Type: New Patient  Location: Outpatient office facility  PCP: Veda Gerald, MD  DOS: 11/04/2023    Referring Prov.: Paulett Boros, MD   Primary Reason(s) for Visit: Encounter for initial evaluation of one or more chronic problems (new to examiner) potentially causing chronic pain, and posing a threat to normal musculoskeletal function. (Level of risk: High) CC: No chief complaint on file.  HPI  Carly [name] is a 43 y.o. year old, female patient, who comes for the first time to our practice referred by Paulett Boros, MD for our initial evaluation of her chronic pain. 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 Today  she comes in for evaluation of her No chief complaint on file.  Pain Assessment: Location:     Radiating:   Onset:   Duration:   Quality:   Severity:  /10 (subjective, self-reported pain score)  Effect on ADL:   Timing:   Modifying factors:   BP:    HR:    Onset and Duration: {Hx; Onset and Duration:210120511} Cause of pain: {Hx; Cause:210120521} Severity: {Pain Severity:210120502} Timing: {Symptoms; Timing:210120501} Aggravating Factors: {Causes; Aggravating pain factors:210120507} Alleviating Factors: {Causes; Alleviating Factors:210120500} Associated Problems: {Hx; Associated problems:210120515} Quality of Pain: {Hx; Symptom quality or Descriptor:210120531} Previous Examinations or Tests: {Hx; Previous examinations or test:210120529} Previous Treatments: {Hx; Previous Treatment:210120503}  Carly [name] is being evaluated for possible interventional pain management therapies for the treatment of her chronic pain.  Discussed the use of AI scribe software for clinical note transcription with the patient, who gave verbal consent to proceed.  History of Present Illness           ***  Carly [name] has been informed that this initial visit was an evaluation only.  On the follow up appointment I will go over the results, including ordered tests and available interventional therapies. At that time she will have the opportunity to decide whether to proceed with offered therapies or not. In the event that Carly [name] prefers avoiding interventional options, this will conclude our involvement in the case.  Medication management recommendations may be provided upon request.  Patient informed that diagnostic tests may be ordered to assist in identifying underlying causes, narrow the list of differential diagnoses and aid in determining candidacy for (or contraindications to) planned therapeutic interventions.  Historic Controlled Substance Pharmacotherapy Review  PMP and historical list  of controlled substances: ***  Most recently prescribed opioid analgesics: *** MME/day: *** mg/day  Historical Monitoring: The patient  reports no history of drug use.  List of prior UDS Testing: No results found for: "MDMA", "COCAINSCRNUR", "PCPSCRNUR", "PCPQUANT", "CANNABQUANT", "THCU", "ETH", "CBDTHCR", "D8THCCBX", "D9THCCBX" Historical Background Evaluation: [place] PMP: PDMP reviewed during this encounter. {Blank single:19197::"   ","Unable to conduct review of the controlled substance reporting system due to technological failure.","Six (6) year initial data search conducted.","Review of the past 60-months conducted."} {Blank single:19197::"Abnormal pattern detected.","No abnormal patterns identified.","Database search revealed no record of prior opioid analgesics.","Database search revealed no record of prior controlled substances.","Regular, uninterrupted pattern of monthly opioid refills detected.","Discrepancies found between information provided by the patient and the database.","     "} {Blank single:19197::"Pattern of multiple prescribers found","Pattern of multiple pharmacies found","A pattern of multiple prescribers and multiple pharmacies was identified","     "} PMP NARX Score Report:  Narcotic: *** Sedative: *** Stimulant: *** [place] Department of public safety, offender search: Engineer, mining Information) {Blank single:19197::"Positive for","No criminal record(s) found","Non-contributory"} Risk Assessment Profile: Aberrant behavior: {Aberrant Behavior:210120800::"None observed or detected today"} Risk factors for fatal opioid overdose: {Risk Factors:210120801::"None identified today"} PMP NARX Overdose Risk Score: *** Fatal overdose hazard ratio (HR): {Blank single:19197::"1.32 for 20-49 MME/day","1.92 for 50-99 MME/day","2.04 for doses equal to, or higher than 100 MME/day","Calculation deferred"} Non-fatal overdose hazard ratio (HR): {Blank single:19197::"1.44 for 20-49 MME/day","3.73 for 50-99  MME/day","8.87 for 100-199 MME/day","2.88 for doses equal to, or higher than 200 MME/day","Calculation deferred"} Risk of opioid abuse or dependence: 0.7-3.0% with doses <= 36 MME/day and 6.1-26% with doses >= 120 MME/day. Substance use disorder (SUD) risk level: {Blank single:19197::"High","Moderate","Low","Pending results of Medical Psychology Evaluation for SUD","See ORT evaluation","See below"} Personal History of Substance Abuse (SUD-Substance use disorder):  Alcohol:    Illegal Drugs:    Rx Drugs:    ORT Risk Level calculation:    ORT Scoring interpretation table:  Score <3 = Low Risk for SUD  Score between 4-7 = Moderate Risk for SUD  Score >8 = High Risk for Opioid Abuse   PHQ-2 Depression Scale:  Total score:    PHQ-2 Scoring interpretation table: (Score and probability of major depressive disorder)  Score 0 = No depression  Score 1 = 15.4% Probability  Score 2 = 21.1% Probability  Score 3 = 38.4% Probability  Score 4 = 45.5% Probability  Score 5 = 56.4% Probability  Score 6 = 78.6% Probability   PHQ-9 Depression Scale:  Total score:    PHQ-9 Scoring interpretation table:  Score 0-4 = No depression  Score 5-9 = Mild depression  Score 10-14 = Moderate depression  Score 15-19 = Moderately severe depression  Score 20-27 = Severe depression (2.4 times higher risk of SUD and 2.89 times higher risk of overuse)   Pharmacologic Plan: {Blank single:19197::"None at this point.","Non-opioid analgesic therapy offered.","Further information needed.","No further testing ordered.","Adjust therapy: ***","Discontinue opioid analgesic therapy.","Continue therapy as is.","No opioid analgesics.","Ms. Carr indicated having a preference to stay away from opioid analgesics.","Ms. Meyn did not have a "Drug Screening Test" done today.","As per protocol, I have not taken over any controlled substance management, pending the results of ordered tests and/or consults."} {Blank single:19197::"She  declined/refused having a "Drug Screening test" done today.","She left without providing us  with a sample for the "Drug Screening Test".","Ms. Neils states being against the use of "opioid analgesics", as part of her treatment plan.","Ms. Iacobucci has expressed her preference to stay away from controlled substances.","Ms. Degenhart has indicated not being interested in our services, at this time.","          "} Initial impression: {Blank single:19197::"N/A","The patient indicated having no interest, at this time.","Ms.  Mott indicated having no interest in opioid therapy, at this point.","Poor candidate for opioid analgesics.","High risk for opiate therapy.","No immediate contraindications found.","Pending review of available data and ordered tests."}  Meds   Current Outpatient Medications:    ALPRAZolam  (XANAX ) 0.5 MG tablet, TAKE 1 TABLET BY MOUTH AT BEDTIME AS NEEDED FOR ANXIETY, Disp: 30 tablet, Rfl: 5   anastrozole  (ARIMIDEX ) 1 MG tablet, TAKE 1 TABLET BY MOUTH ONCE DAILY, Disp: 30 tablet, Rfl: 6   ascorbic acid (VITAMIN C) 100 MG tablet, Take 100 mg by mouth daily., Disp: , Rfl:    Calcium-Magnesium -Vitamin D  (CALCIUM 500 PO), Take 1 tablet by mouth daily., Disp: , Rfl:    cyanocobalamin  (VITAMIN B12) 1000 MCG/ML injection, INJECT 1 ML IN THE MUSCLE EVERY 30 DAYS, Disp: 3 mL, Rfl: 6   denosumab  (PROLIA ) 60 MG/ML SOSY injection, Inject 60 mg into the skin every 6 (six) months., Disp: , Rfl:    docusate sodium  (COLACE) 100 MG capsule, Take 100 mg by mouth daily as needed for mild constipation. (Patient not taking: Reported on 08/14/2023), Disp: , Rfl:    DULoxetine  (CYMBALTA ) 30 MG capsule, Take 30 mg by mouth daily. , Disp: , Rfl: 1   DULoxetine  (CYMBALTA ) 60 MG capsule, Take 1 capsule (60 mg total) by mouth daily., Disp: 90 capsule, Rfl: 3   gabapentin  (NEURONTIN ) 600 MG tablet, Take 600 mg by mouth at bedtime., Disp: , Rfl: 1   HYDROcodone -acetaminophen  (NORCO) 10-325 MG tablet, Take 1 tablet  by mouth every 4 (four) hours as needed. (Patient taking differently: Take 1 tablet by mouth every 4 (four) hours as needed for moderate pain (pain score 4-6).), Disp: 10 tablet, Rfl: 0   Misc. Devices MISC, Please provide patient with a compression therapy pump fot her Right arm lymphedema., Disp: 1 each, Rfl: 0   Multiple Vitamins-Minerals (MULTIVITAMIN ADULT PO), Take 1 tablet by mouth daily. (Patient not taking: Reported on 08/14/2023), Disp: , Rfl:    potassium chloride  SA (KLOR-CON  M) 20 MEQ tablet, Take 2 tablets (40 mEq total) by mouth daily for 3 days., Disp: 6 tablet, Rfl: 0   promethazine  (PHENERGAN ) 12.5 MG tablet, Take 1 tablet (12.5 mg total) by mouth every 4 (four) hours as needed for nausea or vomiting. (Patient not taking: Reported on 08/14/2023), Disp: 30 tablet, Rfl: 0   valACYclovir (VALTREX) 1000 MG tablet, Take 2,000 mg by mouth 2 (two) times daily as needed (cold sores). (Patient not taking: Reported on 08/14/2023), Disp: , Rfl:   Imaging Review  Cervical Imaging: Cervical MR wo contrast: No results found for this or any previous visit.  Cervical MR wo contrast: No results found for this or any previous visit.  Cervical MR w/wo contrast: No results found for this or any previous visit.  Cervical MR w contrast: No results found for this or any previous visit.  Cervical CT wo contrast: No results found for this or any previous visit.  Cervical CT w/wo contrast: No results found for this or any previous visit.  Cervical CT w/wo contrast: No results found for this or any previous visit.  Cervical CT w contrast: No results found for this or any previous visit.  Cervical CT outside: No results found for this or any previous visit.  Cervical DG 1 view: No results found for this or any previous visit.  Cervical DG 2-3 views: No results found for this or any previous visit.  Cervical DG F/E views: No results found for this or any previous visit.  Cervical DG 2-3 clearing  views: No results found for this or any previous visit.  Cervical DG Bending/F/E views: No results found for this or any previous visit.  Cervical DG complete: No results found for this or any previous visit.  Cervical DG Myelogram views: No results found for this or any previous visit.  Cervical DG Myelogram views: No results found for this or any previous visit.  Cervical Discogram views: No results found for this or any previous visit.   Shoulder Imaging: Shoulder-R MR w contrast: No results found for this or any previous visit.  Shoulder-L MR w contrast: No results found for this or any previous visit.  Shoulder-R MR w/wo contrast: No results found for this or any previous visit.  Shoulder-L MR w/wo contrast: No results found for this or any previous visit.  Shoulder-R MR wo contrast: No results found for this or any previous visit.  Shoulder-L MR wo contrast: No results found for this or any previous visit.  Shoulder-R CT w contrast: No results found for this or any previous visit.  Shoulder-L CT w contrast: No results found for this or any previous visit.  Shoulder-R CT w/wo contrast: No results found for this or any previous visit.  Shoulder-L CT w/wo contrast: No results found for this or any previous visit.  Shoulder-R CT wo contrast: No results found for this or any previous visit.  Shoulder-L CT wo contrast: No results found for this or any previous visit.  Shoulder-R DG Arthrogram: No results found for this or any previous visit.  Shoulder-L DG Arthrogram: No results found for this or any previous visit.  Shoulder-R DG 1 view: No results found for this or any previous visit.  Shoulder-L DG 1 view: No results found for this or any previous visit.  Shoulder-R DG: No results found for this or any previous visit.  Shoulder-L DG: No results found for this or any previous visit.   Thoracic Imaging: Thoracic MR wo contrast: No results found for this or any  previous visit.  Thoracic MR wo contrast: No results found for this or any previous visit.  Thoracic MR w/wo contrast: No results found for this or any previous visit.  Thoracic MR w contrast: No results found for this or any previous visit.  Thoracic CT wo contrast: No results found for this or any previous visit.  Thoracic CT w/wo contrast: No results found for this or any previous visit.  Thoracic CT w/wo contrast: No results found for this or any previous visit.  Thoracic CT w contrast: No results found for this or any previous visit.  Thoracic DG 2-3 views: No results found for this or any previous visit.  Thoracic DG 4 views: No results found for this or any previous visit.  Thoracic DG: No results found for this or any previous visit.  Thoracic DG w/swimmers view: No results found for this or any previous visit.  Thoracic DG Myelogram views: No results found for this or any previous visit.  Thoracic DG Myelogram views: No results found for this or any previous visit.   Lumbosacral Imaging: Lumbar MR wo contrast: No results found for this or any previous visit.  Lumbar MR wo contrast: No results found for this or any previous visit.  Lumbar MR w/wo contrast: No results found for this or any previous visit.  Lumbar MR w/wo contrast: No results found for this or any previous visit.  Lumbar MR w contrast: No results found for this or  any previous visit.  Lumbar CT wo contrast: No results found for this or any previous visit.  Lumbar CT w/wo contrast: No results found for this or any previous visit.  Lumbar CT w/wo contrast: No results found for this or any previous visit.  Lumbar CT w contrast: No results found for this or any previous visit.  Lumbar DG 1V: No results found for this or any previous visit.  Lumbar DG 1V (Clearing): No results found for this or any previous visit.  Lumbar DG 2-3V (Clearing): No results found for this or any previous  visit.  Lumbar DG 2-3 views: No results found for this or any previous visit.  Lumbar DG (Complete) 4+V: No results found for this or any previous visit.  {Blank single:19197::"Radiography is not sensitive for detection of early osteoarthritis of the facet joints. It can detect severe cases of facet arthrosis. Oblique views are preferred over standard AP and lateral views. Joint space narrowing, subchondral sclerosis, and osteophyte formation are common radiographic findings. These findings can be observed in this patient's oblique views.","     "} Lumbar DG F/E views: No results found for this or any previous visit.  {Blank single:19197::"Radiography is not sensitive for detection of early osteoarthritis of the facet joints. It can detect severe cases of facet arthrosis. Oblique views are preferred over standard AP and lateral views. Joint space narrowing, subchondral sclerosis, and osteophyte formation are common radiographic findings. These findings can be observed in this patient's oblique views.","     "} Lumbar DG Bending views: No results found for this or any previous visit.  {Blank single:19197::"Radiography is not sensitive for detection of early osteoarthritis of the facet joints. It can detect severe cases of facet arthrosis. Oblique views are preferred over standard AP and lateral views. Joint space narrowing, subchondral sclerosis, and osteophyte formation are common radiographic findings. These findings can be observed in this patient's oblique views.","     "} Lumbar DG Myelogram views: No results found for this or any previous visit.  Lumbar DG Myelogram: No results found for this or any previous visit.  Lumbar DG Myelogram: No results found for this or any previous visit.  Lumbar DG Myelogram: No results found for this or any previous visit.  Lumbar DG Myelogram Lumbosacral: No results found for this or any previous visit.  Lumbar DG Diskogram views: No results found for this or  any previous visit.  Lumbar DG Diskogram views: No results found for this or any previous visit.  Lumbar DG Epidurogram OP: No results found for this or any previous visit.  Lumbar DG Epidurogram IP: No results found for this or any previous visit.   Sacroiliac Joint Imaging: Sacroiliac Joint DG: No results found for this or any previous visit.  Sacroiliac Joint MR w/wo contrast: No results found for this or any previous visit.  Sacroiliac Joint MR wo contrast: No results found for this or any previous visit.   Spine Imaging: Whole Spine DG Myelogram views: No results found for this or any previous visit.  Whole Spine MR Mets screen: No results found for this or any previous visit.  Whole Spine MR Mets screen: No results found for this or any previous visit.  Whole Spine MR w/wo: No results found for this or any previous visit.  MRA Spinal Canal w/ cm: No results found for this or any previous visit.  MRA Spinal Canal wo/ cm: No results found for this or any previous visit.  MRA Spinal  Canal w/wo cm: No results found for this or any previous visit.  Spine Outside MR Films: No results found for this or any previous visit.  Spine Outside CT Films: No results found for this or any previous visit.  CT-Guided Biopsy: No results found for this or any previous visit.  CT-Guided Needle Placement: No results found for this or any previous visit.  DG Spine outside: No results found for this or any previous visit.  IR Spine outside: No results found for this or any previous visit.  NM Spine outside: No results found for this or any previous visit.   Hip Imaging: Hip-R MR w contrast: No results found for this or any previous visit.  Hip-L MR w contrast: No results found for this or any previous visit.  Hip-R MR w/wo contrast: No results found for this or any previous visit.  Hip-L MR w/wo contrast: No results found for this or any previous visit.  Hip-R MR wo contrast: No  results found for this or any previous visit.  Hip-L MR wo contrast: No results found for this or any previous visit.  Hip-R CT w contrast: No results found for this or any previous visit.  Hip-L CT w contrast: No results found for this or any previous visit.  Hip-R CT w/wo contrast: No results found for this or any previous visit.  Hip-L CT w/wo contrast: No results found for this or any previous visit.  Hip-R CT wo contrast: No results found for this or any previous visit.  Hip-L CT wo contrast: No results found for this or any previous visit.  Hip-R DG 2-3 views: No results found for this or any previous visit.  Hip-L DG 2-3 views: No results found for this or any previous visit.  Hip-R DG Arthrogram: No results found for this or any previous visit.  Hip-L DG Arthrogram: No results found for this or any previous visit.  Hip-B DG Bilateral: No results found for this or any previous visit.  Hip-B DG Bilateral (5V): No results found for this or any previous visit.   Knee Imaging: Knee-R MR w contrast: No results found for this or any previous visit.  Knee-L MR w/o contrast: No results found for this or any previous visit.  Knee-R MR w/wo contrast: No results found for this or any previous visit.  Knee-L MR w/wo contrast: No results found for this or any previous visit.  Knee-R MR wo contrast: No results found for this or any previous visit.  Knee-L MR wo contrast: No results found for this or any previous visit.  Knee-R CT w contrast: No results found for this or any previous visit.  Knee-L CT w contrast: No results found for this or any previous visit.  Knee-R CT w/wo contrast: No results found for this or any previous visit.  Knee-L CT w/wo contrast: No results found for this or any previous visit.  Knee-R CT wo contrast: No results found for this or any previous visit.  Knee-L CT wo contrast: No results found for this or any previous visit.  Knee-R DG 1-2  views: No results found for this or any previous visit.  Knee-L DG 1-2 views: No results found for this or any previous visit.  Knee-R DG 3 views: No results found for this or any previous visit.  Knee-L DG 3 views: No results found for this or any previous visit.  Knee-R DG 4 views: No results found for this or any previous visit.  Knee-L DG 4 views: No results found for this or any previous visit.  Knee-R DG Arthrogram: No results found for this or any previous visit.  Knee-L DG Arthrogram: No results found for this or any previous visit.   Ankle Imaging: Ankle-R DG Complete: No results found for this or any previous visit.  Ankle-L DG Complete: No results found for this or any previous visit.   Foot Imaging: Foot-R DG Complete: No results found for this or any previous visit.  Foot-L DG Complete: No results found for this or any previous visit.   Elbow Imaging: Elbow-R DG Complete: No results found for this or any previous visit.  Elbow-L DG Complete: No results found for this or any previous visit.   Wrist Imaging: Wrist-R DG Complete: No results found for this or any previous visit.  Wrist-L DG Complete: No results found for this or any previous visit.   Hand Imaging: Hand-R DG Complete: No results found for this or any previous visit.  Hand-L DG Complete: No results found for this or any previous visit.   Complexity Note: {Blank single:19197::"No results found under the [facility] electronic medical record.","No new results found.","Imaging results reviewed. Results shared with Ms. Yung, using Layman's terms.","Imaging results reviewed."} {Blank single:19197::"Today I personally and independently reviewed the study images pertinent to Ms. Levins's problem.","      "} {Blank single:19197::"Results visible under [facility].","Results visible under [facility].","Results visible under [facility].","Results visible under [facility].","Results visible under "Care  Everywhere".","Results made available to patient.","Copy of results provided to patient.","     "} {Blank single:19197::"Images reviewed using the PACS system hyperlink.","    "} {Blank single:19197::"I have personally examined the images and I agree with the reported  findings. I find no additional pain-related pathology to add to the report.","I have personally examined the images. I agree with the Radiologist's report. The following observed findings are of concern to me:","    "}  ROS  Cardiovascular: {Hx; Cardiovascular History:210120525} Pulmonary or Respiratory: {Hx; Pumonary and/or Respiratory History:210120523} Neurological: {Hx; Neurological:210120504} Psychological-Psychiatric: {Hx; Psychological-Psychiatric History:210120512} Gastrointestinal: {Hx; Gastrointestinal:210120527} Genitourinary: {Hx; Genitourinary:210120506} Hematological: {Hx; Hematological:210120510} Endocrine: {Hx; Endocrine history:210120509} Rheumatologic: {Hx; Rheumatological:210120530} Musculoskeletal: {Hx; Musculoskeletal:210120528} Work History: {Hx; Work history:210120514}  Allergies  Ms. Plaugher is allergic to latex, other, sumatriptan, and tape.  Laboratory Chemistry Profile   Renal Lab Results  Component Value Date   BUN 12 08/21/2023   CREATININE 0.70 08/21/2023   GFRAA >60 09/22/2019   GFRNONAA >60 08/21/2023   PROTEINUR NEGATIVE 03/05/2021     Electrolytes Lab Results  Component Value Date   NA 137 08/21/2023   K 3.1 (L) 08/21/2023   CL 102 08/21/2023   CALCIUM 8.7 (L) 08/21/2023   MG 1.8 08/21/2022   PHOS 1.9 (L) 03/05/2021     Hepatic Lab Results  Component Value Date   AST 22 08/21/2023   ALT 18 08/21/2023   ALBUMIN 3.6 08/21/2023   ALKPHOS 37 (L) 08/21/2023     ID Lab Results  Component Value Date   HIV Non Reactive 08/21/2022   SARSCOV2NAA NEGATIVE 08/20/2022   PREGTESTUR NEGATIVE 03/05/2021     Bone Lab Results  Component Value Date   VD25OH 39.74 08/21/2023      Endocrine Lab Results  Component Value Date   GLUCOSE 107 (H) 08/21/2023   GLUCOSEU NEGATIVE 03/05/2021   HGBA1C 4.6 (L) 04/11/2016   TSH 1.506 09/22/2019     Neuropathy Lab Results  Component Value Date   VITAMINB12 254 07/30/2022  FOLATE 7.0 10/11/2020   HGBA1C 4.6 (L) 04/11/2016   HIV Non Reactive 08/21/2022     CNS No results found for: "COLORCSF", "APPEARCSF", "RBCCOUNTCSF", "WBCCSF", "POLYSCSF", "LYMPHSCSF", "EOSCSF", "PROTEINCSF", "GLUCCSF", "JCVIRUS", "CSFOLI", "IGGCSF", "LABACHR", "ACETBL"   Inflammation (CRP: Acute  ESR: Chronic) Lab Results  Component Value Date   LATICACIDVEN 0.8 08/20/2022     Rheumatology No results found for: "RF", "ANA", "LABURIC", "URICUR", "LYMEIGGIGMAB", "LYMEABIGMQN", "HLAB27"   Coagulation Lab Results  Component Value Date   INR 1.3 (H) 03/06/2021   LABPROT 15.7 (H) 03/06/2021   APTT 37 (H) 03/06/2021   PLT 146 (L) 08/21/2023   DDIMER 0.51 (H) 03/05/2021     Cardiovascular Lab Results  Component Value Date   HGB 11.7 (L) 08/21/2023   HCT 34.4 (L) 08/21/2023     Screening Lab Results  Component Value Date   SARSCOV2NAA NEGATIVE 08/20/2022   HIV Non Reactive 08/21/2022   PREGTESTUR NEGATIVE 03/05/2021     Cancer No results found for: "CEA", "CA125", "LABCA2"   Allergens No results found for: "ALMOND", "APPLE", "ASPARAGUS", "AVOCADO", "BANANA", "BARLEY", "BASIL", "BAYLEAF", "GREENBEAN", "LIMABEAN", "WHITEBEAN", "BEEFIGE", "REDBEET", "BLUEBERRY", "BROCCOLI", "CABBAGE", "MELON", "CARROT", "CASEIN", "CASHEWNUT", "CAULIFLOWER", "CELERY"     Note: {Blank single:19197::"No results found under the [facility] electronic medical record","Results made available to patient.","Lab results reviewed and made available to patient.","Lab results reviewed and explained to patient in Layman's terms.","Lab results reviewed."}  PFSH  Drug: Ms. Cavaness  reports no history of drug use. Alcohol:  reports current alcohol  use. Tobacco:  reports that she has never smoked. She has never used smokeless tobacco. Medical:  has a past medical history of Anxiety, Breast cancer (HCC) (06/26/2011), Neuropathy, and Osteopenia. Family: family history includes BRCA 1/2 in her maternal aunt and mother; Breast cancer in her maternal aunt; Breast cancer (age of onset: 12) in her sister; Breast cancer (age of onset: 67) in her mother; Healthy in her maternal aunt, maternal aunt, and sister; Heart attack in her maternal grandfather and paternal grandfather; Ovarian cancer in her paternal grandmother; Prostate cancer in her father.  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   Fibrosis of skin 09/29/2012   H/O oophorectomy 12/12/2015   Migraines 05/06/2012   Obesity, Class III, BMI 40-49.9 (morbid obesity) 08/10/2015   Pain of right upper extremity 09/13/2015   Port-A-Cath in place 08/26/2015   Breast cancer (HCC) 07/12/2015   Splenomegaly 08/10/2015   Osteopenia 06/14/2016   Aromatase inhibitor use 06/14/2016   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, estrogen receptor positive (HCC) 03/12/2017   Lymphedema 05/02/2017   Atrophic vaginitis 05/02/2017   Chondromalacia patellae, 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 In no apparent acute distress"} There were no vitals filed for this visit. BMI Assessment: Estimated body mass index is 42.47 kg/m as calculated from the following:   Height as of 02/07/23: 5\' 7"  (1.702 m).   Weight as of 08/26/23: 271 lb 2.7 oz (123 kg).  BMI interpretation table: BMI level Category Range association with higher incidence of chronic pain  <18 kg/m2 Underweight   18.5-24.9 kg/m2 Ideal body weight   25-29.9 kg/m2 Overweight Increased incidence by 20%  30-34.9 kg/m2 Obese (Class I) Increased incidence by 68%  35-39.9 kg/m2 Severe obesity (Class II) Increased incidence by 136%  >40 kg/m2 Extreme obesity (Class III) Increased incidence by 254%   Patient's current BMI Ideal Body weight  There is no height or weight on file to calculate BMI. Patient weight not recorded   BMI Readings from Last 4 Encounters:  08/26/23 42.47 kg/m  02/07/23 43.87 kg/m  08/20/22 43.07 kg/m  08/02/22 43.79 kg/m   Wt Readings from Last 4 Encounters:  08/26/23 271 lb 2.7 oz (123 kg)  02/07/23 280 lb 1.6 oz (127.1 kg)  08/20/22 275 lb (124.7 kg)  08/02/22 279 lb 9.6 oz (126.8 kg)    Psych/Mental status: {Blank single:19197::"Alert and oriented x 3. Exaggerated physical and/or psychosocial pain behavior perceived.","Alert, oriented x 3 (person, place, & time)"} {Blank single:19197::"Ms. Soberanis's speech pattern and demeanor seems to suggest oversedation","     "} Eyes: {Blank single:19197::"Miotic (pupilary constriction) due to opiate use","Midriatic","Anisocoric","Evidence of ptosis","Pin-point pupils","PERLA"} Respiratory: {Blank single:19197::"Oxygen-dependent COPD","No evidence of acute respiratory  distress"}  Assessment  Primary Diagnosis & Pertinent Problem List: There were no encounter diagnoses.  Visit Diagnosis (New problems to examiner): No diagnosis found. Plan of Care (Initial workup plan)  Note: {Blank single:19197::"     ","Ms. Ridolfi has communicated to us  that she is not interested in our services, at this time.","Ms. Ruttan was reminded that as per protocol, today's visit has been an evaluation only. We have not taken over the patient's controlled substance management."}  Problem-specific plan: Assessment and Plan            Lab Orders  No laboratory test(s) ordered today   Imaging Orders  No imaging studies ordered today   Referral Orders  No referral(s) requested today   Procedure Orders    No procedure(s) ordered today   Pharmacotherapy (current): Medications ordered:  No orders of the defined types were placed in this encounter.  Medications administered during this visit: Tatumn L. Wesenberg had no medications administered during this visit.   Analgesic Pharmacotherapy:  Opioid Analgesics: For patients currently taking or requesting to take opioid analgesics, in accordance with [place]  Medical Board Guidelines, we will assess their risks and indications for the use of these substances. After completing our evaluation, we may offer recommendations, but we no longer take patients for medication management. The prescribing physician will ultimately decide, based on his/her training and level of comfort whether to adopt any of the recommendations, including whether or not to prescribe such medicines.  Membrane stabilizer: {Blank single:19197::"Not indicated","Medically contraindicated","Tried and failed","I will not be taking over Ms. Ringer medication regimen","Adequate regimen","To be determined at a later time"}  Muscle relaxant: {Blank single:19197::"Not indicated","Medically contraindicated","Tried and failed","I will not be taking over Ms.  Froelich medication regimen","Adequate regimen","To be determined at  a later time"}  NSAID: {Blank single:19197::"Not indicated","Medically contraindicated","Tried and failed","I will not be taking over Ms. Murthy medication regimen","Adequate regimen","To be determined at a later time"}  Other analgesic(s): {Blank single:19197::"Not indicated","Medically contraindicated","Tried and failed","I will not be taking over Ms. Hine medication regimen","Adequate regimen","To be determined at a later time"}   Interventional management options: Ms. Reamer was informed that there is no guarantee that she would be a candidate for interventional therapies. The decision will be based on the results of diagnostic studies, as well as Ms. Dykema's risk profile.  Procedure(s) under consideration:  {Blank single:19197::"See below","Pending results of ordered studies"}     Interventional Therapies  Risk Factors  Considerations  Medical Comorbidities:     Planned  Pending:      Under consideration:   Pending   Completed:   None at this time   Therapeutic  Palliative (PRN) options:   None established   Completed by other providers:   None reported     Provider-requested follow-up: No follow-ups on file.  Future Appointments  Date Time Provider Department Center  11/04/2023  1:00 PM Renaldo Caroli, MD [facility] None  11/11/2023  2:00 PM [facility] NURSE [facility] None  02/17/2024  3:00 PM [facility] NURSE [facility] None  02/26/2024  3:30 PM Paulett Boros, MD [facility] None   I discussed the assessment and treatment plan with the patient. The patient was provided an opportunity to ask questions and all were answered. The patient agreed with the plan and demonstrated an understanding of the instructions.  Patient advised to call back or seek an in-person evaluation if the symptoms or condition worsens.  Duration of encounter: *** minutes.  Total time on encounter, as per AMA guidelines  included both the face-to-face and non-face-to-face time personally spent by the physician and/or other qualified health care professional(s) on the day of the encounter (includes time in activities that require the physician or other qualified health care professional and does not include time in activities normally performed by clinical staff). Physician's time may include the following activities when performed: Preparing to see the patient (e.g., pre-charting review of records, searching for previously ordered imaging, lab work, and nerve conduction tests) Review of prior analgesic pharmacotherapies. Reviewing PMP Interpreting ordered tests (e.g., lab work, imaging, nerve conduction tests) Performing post-procedure evaluations, including interpretation of diagnostic procedures Obtaining and/or reviewing separately obtained history Performing a medically appropriate examination and/or evaluation Counseling and educating the patient/family/caregiver Ordering medications, tests, or procedures Referring and communicating with other health care professionals (when not separately reported) Documenting clinical information in the electronic or other health record Independently interpreting results (not separately reported) and communicating results to the patient/ family/caregiver Care coordination (not separately reported)  Note by: Candi Chafe, MD (TTS and AI technology used. I apologize for any typographical errors that were not detected and corrected.) Date: 11/04/2023; Time: 4:28 PM

## 2023-11-04 ENCOUNTER — Encounter: Payer: Self-pay | Admitting: Pain Medicine

## 2023-11-04 ENCOUNTER — Ambulatory Visit: Attending: Pain Medicine | Admitting: Pain Medicine

## 2023-11-04 VITALS — BP 140/95 | HR 95 | Temp 97.5°F | Resp 18 | Ht 67.0 in | Wt 260.0 lb

## 2023-11-04 DIAGNOSIS — M79601 Pain in right arm: Secondary | ICD-10-CM | POA: Diagnosis not present

## 2023-11-04 DIAGNOSIS — G620 Drug-induced polyneuropathy: Secondary | ICD-10-CM

## 2023-11-04 DIAGNOSIS — G8929 Other chronic pain: Secondary | ICD-10-CM | POA: Insufficient documentation

## 2023-11-04 DIAGNOSIS — M79671 Pain in right foot: Secondary | ICD-10-CM | POA: Insufficient documentation

## 2023-11-04 DIAGNOSIS — Z79891 Long term (current) use of opiate analgesic: Secondary | ICD-10-CM | POA: Diagnosis not present

## 2023-11-04 DIAGNOSIS — M79604 Pain in right leg: Secondary | ICD-10-CM | POA: Diagnosis not present

## 2023-11-04 DIAGNOSIS — Z803 Family history of malignant neoplasm of breast: Secondary | ICD-10-CM

## 2023-11-04 DIAGNOSIS — M79602 Pain in left arm: Secondary | ICD-10-CM | POA: Insufficient documentation

## 2023-11-04 DIAGNOSIS — Z853 Personal history of malignant neoplasm of breast: Secondary | ICD-10-CM | POA: Diagnosis not present

## 2023-11-04 DIAGNOSIS — R52 Pain, unspecified: Secondary | ICD-10-CM | POA: Diagnosis present

## 2023-11-04 DIAGNOSIS — M899 Disorder of bone, unspecified: Secondary | ICD-10-CM

## 2023-11-04 DIAGNOSIS — G893 Neoplasm related pain (acute) (chronic): Secondary | ICD-10-CM | POA: Diagnosis not present

## 2023-11-04 DIAGNOSIS — T451X5A Adverse effect of antineoplastic and immunosuppressive drugs, initial encounter: Secondary | ICD-10-CM | POA: Diagnosis not present

## 2023-11-04 DIAGNOSIS — Z789 Other specified health status: Secondary | ICD-10-CM

## 2023-11-04 DIAGNOSIS — G894 Chronic pain syndrome: Secondary | ICD-10-CM | POA: Diagnosis not present

## 2023-11-04 DIAGNOSIS — Z79899 Other long term (current) drug therapy: Secondary | ICD-10-CM | POA: Diagnosis present

## 2023-11-04 DIAGNOSIS — E876 Hypokalemia: Secondary | ICD-10-CM

## 2023-11-04 DIAGNOSIS — M79605 Pain in left leg: Secondary | ICD-10-CM | POA: Diagnosis not present

## 2023-11-04 DIAGNOSIS — M79672 Pain in left foot: Secondary | ICD-10-CM | POA: Insufficient documentation

## 2023-11-04 NOTE — Patient Instructions (Signed)

## 2023-11-04 NOTE — Progress Notes (Signed)
 Safety precautions to be maintained throughout the outpatient stay will include: orient to surroundings, keep bed in low position, maintain call bell within reach at all times, provide assistance with transfer out of bed and ambulation.

## 2023-11-05 ENCOUNTER — Encounter: Payer: Self-pay | Admitting: *Deleted

## 2023-11-11 ENCOUNTER — Inpatient Hospital Stay: Payer: No Typology Code available for payment source | Attending: Hematology

## 2023-11-11 ENCOUNTER — Encounter (HOSPITAL_COMMUNITY): Payer: Self-pay | Admitting: Hematology

## 2023-11-11 DIAGNOSIS — Z452 Encounter for adjustment and management of vascular access device: Secondary | ICD-10-CM | POA: Diagnosis not present

## 2023-11-11 DIAGNOSIS — C50511 Malignant neoplasm of lower-outer quadrant of right female breast: Secondary | ICD-10-CM | POA: Diagnosis not present

## 2023-11-11 MED ORDER — SODIUM CHLORIDE 0.9% FLUSH
10.0000 mL | INTRAVENOUS | Status: DC | PRN
  Administered 2023-11-11: 10 mL via INTRAVENOUS

## 2023-11-11 MED ORDER — HEPARIN SOD (PORK) LOCK FLUSH 100 UNIT/ML IV SOLN
500.0000 [IU] | Freq: Once | INTRAVENOUS | Status: AC
  Administered 2023-11-11: 500 [IU] via INTRAVENOUS

## 2023-11-11 NOTE — Progress Notes (Signed)
 Ferris Hua presented for Portacath access and flush.  Portacath located left chest wall accessed with  H 20 needle.  Good blood return present. Portacath flushed with 20ml NS and 500U/52ml Heparin  and needle removed intact. No bruising or swelling noted at the site  Procedure tolerated well and without incident.     Discharged from clinic ambulatory in stable condition. Alert and oriented x 3. F/U with [facility] as scheduled.

## 2023-11-11 NOTE — Patient Instructions (Signed)
 [facility] [place] - A DEPT OF [facility]  Discharge Instructions: Thank you for choosing [facility] to provide your oncology and hematology care.  If you have a lab appointment with the Cancer Center - please note that after April 8th, 2024, all labs will be drawn in the cancer center.  You do not have to check in or register with the main entrance as you have in the past but will complete your check-in in the cancer center.  Wear comfortable clothing and clothing appropriate for easy access to any Portacath or PICC line.   We strive to give you quality time with your provider. You may need to reschedule your appointment if you arrive late (15 or more minutes).  Arriving late affects you and other patients whose appointments are after yours.  Also, if you miss three or more appointments without notifying the office, you may be dismissed from the clinic at the provider's discretion.      For prescription refill requests, have your pharmacy contact our office and allow 72 hours for refills to be completed.    Today you port flush     BELOW ARE SYMPTOMS THAT SHOULD BE REPORTED IMMEDIATELY: *FEVER GREATER THAN 100.4 F (38 C) OR HIGHER *CHILLS OR SWEATING *NAUSEA AND VOMITING THAT IS NOT CONTROLLED WITH YOUR NAUSEA MEDICATION *UNUSUAL SHORTNESS OF BREATH *UNUSUAL BRUISING OR BLEEDING *URINARY PROBLEMS (pain or burning when urinating, or frequent urination) *BOWEL PROBLEMS (unusual diarrhea, constipation, pain near the anus) TENDERNESS IN MOUTH AND THROAT WITH OR WITHOUT PRESENCE OF ULCERS (sore throat, sores in mouth, or a toothache) UNUSUAL RASH, SWELLING OR PAIN  UNUSUAL VAGINAL DISCHARGE OR ITCHING   Items with * indicate a potential emergency and should be followed up as soon as possible or go to the Emergency Department if any problems should occur.  Please show the CHEMOTHERAPY ALERT CARD or IMMUNOTHERAPY ALERT CARD at check-in to the Emergency  Department and triage nurse.  Should you have questions after your visit or need to cancel or reschedule your appointment, please contact [facility] [place] - A DEPT OF [facility] [phone]  and follow the prompts.  Office hours are 8:00 a.m. to 4:30 p.m. Monday - Friday. Please note that voicemails left after 4:00 p.m. may not be returned until the following business day.  We are closed weekends and major holidays. You have access to a nurse at all times for urgent questions. Please call the main number to the clinic [phone] and follow the prompts.  For any non-urgent questions, you may also contact your provider using MyChart. We now offer e-Visits for anyone 62 and older to request care online for non-urgent symptoms. For details visit [email or web address].   Also download the MyChart app! Go to the app store, search "MyChart", open the app, select [place], and log in with your MyChart username and password.

## 2023-11-26 DIAGNOSIS — M129 Arthropathy, unspecified: Secondary | ICD-10-CM | POA: Diagnosis not present

## 2023-12-02 DIAGNOSIS — Z6841 Body Mass Index (BMI) 40.0 and over, adult: Secondary | ICD-10-CM | POA: Diagnosis not present

## 2023-12-02 DIAGNOSIS — Z0000 Encounter for general adult medical examination without abnormal findings: Secondary | ICD-10-CM | POA: Diagnosis not present

## 2023-12-02 DIAGNOSIS — M5459 Other low back pain: Secondary | ICD-10-CM | POA: Diagnosis not present

## 2023-12-02 DIAGNOSIS — E66813 Obesity, class 3: Secondary | ICD-10-CM | POA: Diagnosis not present

## 2023-12-04 ENCOUNTER — Encounter (HOSPITAL_COMMUNITY): Payer: Self-pay | Admitting: Hematology

## 2024-01-03 DIAGNOSIS — G43909 Migraine, unspecified, not intractable, without status migrainosus: Secondary | ICD-10-CM | POA: Diagnosis not present

## 2024-01-03 DIAGNOSIS — F324 Major depressive disorder, single episode, in partial remission: Secondary | ICD-10-CM | POA: Diagnosis not present

## 2024-01-03 DIAGNOSIS — Z853 Personal history of malignant neoplasm of breast: Secondary | ICD-10-CM | POA: Diagnosis not present

## 2024-01-03 DIAGNOSIS — Z6841 Body Mass Index (BMI) 40.0 and over, adult: Secondary | ICD-10-CM | POA: Diagnosis not present

## 2024-01-03 DIAGNOSIS — Z008 Encounter for other general examination: Secondary | ICD-10-CM | POA: Diagnosis not present

## 2024-02-04 DIAGNOSIS — T451X5A Adverse effect of antineoplastic and immunosuppressive drugs, initial encounter: Secondary | ICD-10-CM | POA: Diagnosis not present

## 2024-02-04 DIAGNOSIS — G894 Chronic pain syndrome: Secondary | ICD-10-CM | POA: Diagnosis not present

## 2024-02-04 DIAGNOSIS — G893 Neoplasm related pain (acute) (chronic): Secondary | ICD-10-CM | POA: Diagnosis not present

## 2024-02-04 DIAGNOSIS — D701 Agranulocytosis secondary to cancer chemotherapy: Secondary | ICD-10-CM | POA: Diagnosis not present

## 2024-02-04 NOTE — Progress Notes (Signed)
 [facility]  New Patient Visit    Referring Provider Rogers Hai, MD   Assessment   Carly [name] is a 43 y.o. female with PMH significant for  has a past medical history of Breast cancer (*), Clotting disorder (*), DVT (deep venous thrombosis) (*), Migraines, and Recurrent breast cancer, right (*) (07/12/2015). referred from Rogers Hai, MD presenting for new patient consultation.   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 The pain is associated with treatment after being prescribed anastrozole  for bone pain. She reports experiencing pain in her arms and legs, which she describes as a flu-like ache. She also mentions numbness in her hands and a burning sensation in her feet. She is currently on anastrozole  to prevent cancer recurrence, but it has side effects of bone pain. Pain is worse with climbing stairs and overhead activities. Pain improved with rest and medication. She is not currently undergoing chemotherapy. She has tried various medications, including gabapentin  and Cymbalta , to manage her neuropathy and depression. Her current medication regimen includes gabapentin , Cymbalta  30 mg and 60 mg, hydrocodone  10 mg, B12 injections, Prolia , anastrozole , Neurontin , and vitamins.   Previously, she was on 180 tablets of hydrocodone  per month, but this was reduced to 150. She takes Xanax  occasionally for restless legs and sleep issues. She was initially prescribed Xanax  for anxiety when she was diagnosed with cancer and during her chemotherapy and radiation treatments. Her oncologist referred her to a pain specialist. She is still on Norco but is running out of it. She sometimes takes it 5 to 6 times a day. She has been on opioid management since 2018.   Her current pain provider Heather no longer accepts her  insurance and she is not longer able to afford the co-pay.    1. Chronic pain disorder  AMB Referral to Pain Management    2. Cancer related pain  AMB Referral to Pain Management    3. Chemotherapy induced neutropenia        Plan      1.  Urine drug screen was not obtained.  2. [facility] Reporting is reviewed today and consistent with current reported medication and prior physicians.      Narcotic Violations: none Current Medication Regimen -stable Side effects -minimal   Activity Level- adequate [employer] Behavior- nil Narcotic Contract- not obtained  3. Opioid risk tool score:   - 2 low risk         OPIOID RISK TOOL  More data may exist      02/04/2024  Opioid Risk Tool  Family history alcohol  0  Family history illegal drugs 0  Family history rx drugs 0  Personal history alcohol 0  Personal history illegal drugs 0  Personal history rx drugs 0  Age between 16-45 years 1*  History of preadolescent sexual abuse 0  ADD, OCD, bipolar, schizophrenia 0  Depression 1*  Scoring Total 2  Interpretation Low risk for opioid abuse        4. Opioid Medications:  - none prescribed,, - Discussed potential risks of concurrent benzodiazepines and  prescribed opioids, unable take over regimen. Referral place to outside provider [facility]. Patient welcomed and aware to return when she would like to discuss alternate regimen QID, w/o benzodiazepine.      5. Adjuvant Medications: -none   - Optimize conservative measures including heat/ice therapy to  affected site, acetaminophen  and NSAIDs as needed and per label recommendations, and trial OTC lidocaine patches.  6. Specialized treatments:   - None at this time  7.  Imaging:   - none  8.  Procedures:  - none      9. No follow-ups on file.  10. The patient's blood pressure was taken and reported as  122/83   History of Present Illness: Chief Complaint:  Chief Complaint  Patient presents with  . pain management    The most significant area of pain is arms and legs. The pain began 2018 and has been present >3 months. Patient  incontinent of bowel/bladder. The pain is described as burning , numbing, aching, and constant. The patient's pain score is rated as 5/10 on average over the last 7 days. The pain improves and is better with rest and medications. The pain is made worse with climbing stairs and overhead activities. The patient is unable to do the following activities 2/2 pain: house chores.  Denies symptoms including fevers, chills, constipation, nausea, vomiting, lightheadedness, dizziness, or excessive sedation. Denies any side effects to current medication regimen.    Previous Pain Course/Interventions: Has PT been completed for this pain complaint? no  - Location of PT if completed outside of [facility]:  Has chiropractic been completed for this pain complaint?   Medications utilized:  NSAID's: ibuprofen (Motrin) Muscle Relaxants: cyclobenzaprine  (Flexeril ) Anti-Depressants: none Membrane Stabilizers: gabapentin  (Neurontin ) Opioids: hydrocodone /acetaminophen  (Hycet, Lorcet, Lortab, Norco, Vicodin) and hydromorphone  (Dilaudid ) Other:   Procedures: Facet Injections  Diagnostic Studies: (I personally reviewed all applicable diagnostic imaging pertinent to this visit):     REVIEW OF SYSTEMS:  A complete 13 point review of systems was performed and was noncontributory except as noted in history of present illness, see intake form from today's examination for full details   Health Status  Current medications: Medication List reviewed and updated today. Current Home Medications  Medication Sig Last Dose  ALPRAZolam  (XANAX ) 0.25 mg tablet Take one tablet three times a day as needed.   cyclobenzaprine  (FLEXERIL ) 5 mg tablet Take one tablet (5 mg total) by mouth every 4 (four) hours as needed for Muscle spasms.   docusate sodium  (COLACE) capsule Take one capsule (100 mg total) by  mouth 2 (two) times daily.   gabapentin  (NEURONTIN ) 300 mg capsule Take 300 mg by mouth 2 (two) times daily.   HYDROcodone -acetaminophen  (NORCO) 10-325 mg per tablet Take one tablet by mouth every 4 (four) hours as needed for Pain.   ibuprofen (MOTRIN) 200 mg tablet Take 200 mg by mouth as needed for Pain.   LORazepam (ATIVAN) 1 mg tablet Take 1 mg by mouth every 6 (six) hours as needed. For nausea   Magnesium  200 MG TABS Take 1 tablet by mouth daily.   Misc. Devices MISC Head prosthesis  C50.911   ondansetron  (ZOFRAN ) 4 mg tablet TAKE ONE TABLET BY MOUTH EVERY 6 HOURS AS NEEDED FOR NAUSEA   prochlorperazine (COMPAZINE) 10 MG tablet Take 10 mg by mouth every 6 (six) hours as needed.   promethazine  (PHENERGAN ) 12.5 MG tablet Take 12.5 mg by mouth every 6 (six) hours as needed for Nausea.   tamoxifen (NOLVADEX) 20 MG tablet Take one tablet (20 mg total) by mouth daily.   triamterene-hydrochlorothiazide (MAXZIDE-25) 37.5-25 mg per tablet Take one tab daily as needed for swelling.       Problem list: Problem list reviewed and updated at today's visit Problem List[1]    OBJECTIVE: Body mass  index is 43.07 kg/m.  Physical Exam:  Vitals:   02/04/24 1410  BP: 122/83  Pulse: 93  Resp: 18  Temp: 97.2 F (36.2 C)  TempSrc: Skin  SpO2: 99%  Weight: 275 lb (124.7 kg)  Height: 5' 7 (1.702 m)    GENERAL/PSYCHIATRIC:  Patient is alert, oriented, answers questions appropriately. In no apparent distress. Interpersonal behavior socially appropriate. Mood appropriate to interview. HEENT:  Normocepalic, conjuctiva clear, mucous membranes moist. SKIN:  warm and dry. CARDIOVASCULAR:  Well perfused. No evidence of cyanosis/edema.   RESPIRATORY:  Respirations symetrical and without exertion. ABDOMEN:  soft. EXTREMITIES: Upper and lower extremity motor strength appears to be grossly intact. Grip strength is intact. NEUROLOGICAL:  No gross sensory deficits noted.  SPINE: straight GAIT:  normal  Sensory: Fine sensation: Normal in UE and LE, Bilaterally symmetric Temperature sensation: Grossly normal in UE and LE, Bilaterally symmetric  Pain (Neuro/Musculoskeletal):  PHYSICAL EXAMINATION:   GENERAL: well developed, well nourished female patient awake, alert, and oriented x3, in no acute distress. DERMATOLOGIC: intact, no rashes present, normal turgor HEENT: normocephalic, atraumatic; extraocular movements intact, pupils equal, round, and reactive to light, slerae anicteric, mucous membranes moist. PULMONARY: unlabored respiration, symmetric expansion CARDIOVASCULAR: regular rate and rhythm. ABDOMEN: soft, non-tender, non-distended. EXTREMITIES: no cyanosis, clubbing, or edema; peripheral pulses intact with brisk capillary refill. MSK: normal tone and posture, full range of motion in all extremities,  NEUROLOGIC: CN II-XII grossly intact, sensation to light touch and pinprick intact globally, normal gait and station PSYCHIATRIC: Demonstrates no pressured speech. Normal affect. Normal cognition.  HEMATOLOGIC: no abnormal bruising, petechia, or stria.         Patient's pain was assessed, documented as positive, unless stated in the HPI, and a follow up plan has been documented in the Plan. Patient's medication list was reviewed and updated if applicable.  Body Mass Index (BMI) screening was documented and if the patient's BMI was greater than 25 or less than 18.5, the patient was asked to follow up with their PCP for a full assessment regarding weight management. If Fluoroscopy was used during a procedure, the radiation exposure time and number of images were documented within the record.   Treatment plan fully discussed and agreed upon with patient. All questions were answered. I attest that during this clinical encounter, I confirmed written consent and received verbal authorization to use [employer] to ambiently capture, record, and transcribe the conversation between  myself and Carly [name]. The transcript generated by [employer] was reviewed for accuracy and completeness, and accurately reflects the context of the clinical interaction. Joyce Brunson ,DNP              [1] Patient Active Problem List Diagnosis  . Migraines  . BRCA1 positive  . Fibrosis of skin  . Recurrent breast cancer, right (*)  . Splenomegaly  . Obesity, Class III, BMI 40-49.9 (morbid obesity)  . Port-A-Cath in place  . Pain of right upper extremity  . Chemotherapy-induced neuropathy (*)  . Chemotherapy induced neutropenia  . H/O oophorectomy  . Chronic pain disorder  . Cancer related pain  *Some images could not be shown.

## 2024-02-17 ENCOUNTER — Other Ambulatory Visit: Payer: Self-pay | Admitting: *Deleted

## 2024-02-17 ENCOUNTER — Inpatient Hospital Stay: Attending: Hematology

## 2024-02-17 VITALS — BP 130/91 | HR 81 | Temp 97.4°F | Resp 18

## 2024-02-17 DIAGNOSIS — Z95828 Presence of other vascular implants and grafts: Secondary | ICD-10-CM

## 2024-02-17 DIAGNOSIS — C50011 Malignant neoplasm of nipple and areola, right female breast: Secondary | ICD-10-CM | POA: Diagnosis not present

## 2024-02-17 DIAGNOSIS — C50911 Malignant neoplasm of unspecified site of right female breast: Secondary | ICD-10-CM

## 2024-02-17 DIAGNOSIS — G620 Drug-induced polyneuropathy: Secondary | ICD-10-CM

## 2024-02-17 DIAGNOSIS — Z452 Encounter for adjustment and management of vascular access device: Secondary | ICD-10-CM | POA: Diagnosis not present

## 2024-02-17 DIAGNOSIS — Z79899 Other long term (current) drug therapy: Secondary | ICD-10-CM | POA: Diagnosis not present

## 2024-02-17 DIAGNOSIS — Z1501 Genetic susceptibility to malignant neoplasm of breast: Secondary | ICD-10-CM

## 2024-02-17 LAB — COMPREHENSIVE METABOLIC PANEL WITH GFR
ALT: 15 U/L (ref 0–44)
AST: 16 U/L (ref 15–41)
Albumin: 3.8 g/dL (ref 3.5–5.0)
Alkaline Phosphatase: 47 U/L (ref 38–126)
Anion gap: 11 (ref 5–15)
BUN: 12 mg/dL (ref 6–20)
CO2: 24 mmol/L (ref 22–32)
Calcium: 9 mg/dL (ref 8.9–10.3)
Chloride: 104 mmol/L (ref 98–111)
Creatinine, Ser: 0.6 mg/dL (ref 0.44–1.00)
GFR, Estimated: >60 mL/min (ref >60)
Glucose, Bld: 98 mg/dL (ref 70–99)
Potassium: 3.7 mmol/L (ref 3.5–5.1)
Sodium: 139 mmol/L (ref 135–145)
Total Bilirubin: 0.9 mg/dL (ref 0.0–1.2)
Total Protein: 6.9 g/dL (ref 6.5–8.1)

## 2024-02-17 LAB — CBC WITH DIFFERENTIAL/PLATELET
Abs Immature Granulocytes: 0.01 K/uL (ref 0.00–0.07)
Basophils Absolute: 0 K/uL (ref 0.0–0.1)
Basophils Relative: 0 %
Eosinophils Absolute: 0.1 K/uL (ref 0.0–0.5)
Eosinophils Relative: 1 %
HCT: 34.4 % — ABNORMAL LOW (ref 36.0–46.0)
Hemoglobin: 11.8 g/dL — ABNORMAL LOW (ref 12.0–15.0)
Immature Granulocytes: 0 %
Lymphocytes Relative: 24 %
Lymphs Abs: 1.4 K/uL (ref 0.7–4.0)
MCH: 30.8 pg (ref 26.0–34.0)
MCHC: 34.3 g/dL (ref 30.0–36.0)
MCV: 89.8 fL (ref 80.0–100.0)
Monocytes Absolute: 0.5 K/uL (ref 0.1–1.0)
Monocytes Relative: 8 %
Neutro Abs: 3.8 K/uL (ref 1.7–7.7)
Neutrophils Relative %: 67 %
Platelets: 175 K/uL (ref 150–400)
RBC: 3.83 MIL/uL — ABNORMAL LOW (ref 3.87–5.11)
RDW: 13.3 % (ref 11.5–15.5)
WBC: 5.6 K/uL (ref 4.0–10.5)
nRBC: 0 % (ref 0.0–0.2)

## 2024-02-17 LAB — VITAMIN D 25 HYDROXY (VIT D DEFICIENCY, FRACTURES): Vit D, 25-Hydroxy: 29.39 ng/mL — ABNORMAL LOW (ref 30–100)

## 2024-02-17 MED ORDER — ANASTROZOLE 1 MG PO TABS: 1.0000 mg | ORAL_TABLET | Freq: Every day | ORAL | 6 refills | Status: AC

## 2024-02-17 MED ORDER — HYDROCODONE-ACETAMINOPHEN 10-325 MG PO TABS
1.0000 | ORAL_TABLET | ORAL | 0 refills | Status: DC | PRN
Start: 2024-02-17 — End: 2024-02-25

## 2024-02-17 NOTE — Progress Notes (Signed)
 Patients port flushed without difficulty.  Good blood return noted with no bruising or swelling noted at site.  Labs drawn per orders. Gauze dressing applied.  VSS with discharge and left in satisfactory condition with no s/s of distress noted. All follow ups as scheduled.   Santiago Graf [employer]

## 2024-02-18 ENCOUNTER — Encounter (HOSPITAL_COMMUNITY): Payer: Self-pay | Admitting: Oncology

## 2024-02-18 LAB — CANCER ANTIGEN 15-3: CA 15-3: 17.8 U/mL (ref 0.0–25.0)

## 2024-02-24 ENCOUNTER — Encounter (HOSPITAL_COMMUNITY): Payer: Self-pay | Admitting: Oncology

## 2024-02-25 ENCOUNTER — Other Ambulatory Visit: Payer: Self-pay

## 2024-02-25 ENCOUNTER — Encounter: Payer: Self-pay | Admitting: Physician Assistant

## 2024-02-25 DIAGNOSIS — T451X5A Adverse effect of antineoplastic and immunosuppressive drugs, initial encounter: Secondary | ICD-10-CM

## 2024-02-25 MED ORDER — HYDROCODONE-ACETAMINOPHEN 10-325 MG PO TABS: 1.0000 | ORAL_TABLET | ORAL | 0 refills | Status: DC | PRN

## 2024-02-25 NOTE — Progress Notes (Deleted)
 [facility] [address], [place] [address]   CLINIC:  Medical Oncology/Hematology  PCP:  Carly [name] LABOR, MD [address] / [place] [address] [phone]   REASON FOR VISIT:  Follow-up for recurrent right-sided breast cancer stage IIIb, BRCA1 positive  PRIOR THERAPY: Adjuvant chemotherapy with dose dense AC followed by Taxol from 08/15/2015 through 11/22/2015, XRT completed on 01/27/2016.  CURRENT THERAPY: Anastrozole   BRIEF ONCOLOGIC HISTORY:   Oncology History  Malignant neoplasm of female breast (HCC)  05/06/2015 Initial Biopsy   Incisional biopsy with Dr. Jeoffrey of palpable mass, lower outer quadrant of reconstructed R breast close to nipple ER+ 97%, PR+ 70 (weak) HER 2 - by IHC   07/06/2015 Surgery   Definitive lumpectomy and LN dissection performed revealing a 3.1 cm primary 1/14 positive LN with a macro metastases, satellite nodules adjacent to the primary, LVI-, however she had skin invasion along with satellite nodules, T4b N1a, Stage IIIB, grade IIII,   07/07/2015 Imaging   Per records negative CT C/A/P and bone scan   08/15/2015 - 11/22/2015 Chemotherapy   AC x 4, Taxol X 4 both given dose dense    - 01/27/2016 Radiation Therapy   Dr. Arlie. Carly [name]   02/06/2016 Imaging   Bone density- BMD as determined from [facility] L1-L4 is 1.021 g/cm2 with a T-Score of -1.3. This patient is considered osteopenic according to World Health Organization [facility]) criteria.      CANCER STAGING: Cancer Staging  No matching staging information was found for the patient.   INTERVAL HISTORY:   [name], a 43 y.o. female, returns for routine follow-up of her BRCA1 positive right-sided breast cancer. Carly [name] was last seen on 08/26/2023 by Dr. Rogers.   At today's visit, she  reports feeling ***.   She denies any recent hospitalizations, surgeries, or changes in her  baseline health status. She reports ***% energy and ***% appetite.  She is  maintaining stable weight at this time.  She has not noticed any new breast lumps or axillary lymphadenopathy.  *** ***She denies any new onset bone pain, chest pain, dyspnea, or abdominal pain. ***She has no new headaches, seizures, or focal neurologic deficits. ***No B symptoms such as fever, chills, night sweats, unintentional weight loss.  She continues to take *** ARIMIDEX :  Hot flashes, mood swings, weight gain, alopecia, and musculoskeletal pain  She is receiving Prolia  injections every 6 months.  *** She denies any new onset bone pain, jaw pain, or fracture.  *** She takes vitamin D  ***.  *** Chemotherapy-induced neuropathy (hydrocodone /acetaminophen  10-3 25 every 4 hours)***duloxetine ?  *** Gabapentin ?  *** *** Xanax ?  *** *** Vitamin B12 injections at home?  ***  ASSESSMENT & PLAN:  1.  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  Surgeries were done in [facility]. - Adjuvant chemotherapy with dose dense AC followed by Taxol from 08/15/2015 through 11/22/2015, XRT completed on 01/27/2016. - Femara  started in August 2017, switched to anastrozole  around August 2018 for joint pains which have slightly improved. ***Goal of treatment with aromatase inhibitor is 10 years (through August 2027). - TAH and BSO in 2016 prophylactically for her BRCA1 positivity. - Bone scan on 12/16/2018 did not show any evidence of metastatic disease. - She is tolerating  anastrozole  reasonably well.  ***She reports achy pains in the bones, predominantly in the legs and occasionally in the arms since she started taking aromatase inhibitors.*** - MRI breast (09/09/2023): BI-RADS Category 1, negative. - Physical exam (***): *** - Most recent labs (02/17/2024): CBC grossly normal.  Normal  CMP.  Normal CA 15-3. - PLAN: Continue daily anastrozole . - Continue high risk screening protocol with annual MRI breast (due 09/08/2024), as indicated for patient with BRCA1 gene mutation, right breast DCIS in 2013 with bilateral mastectomies, and recurrent right-sided breast cancer in 2017  2.  Chemotherapy-induced neuropathy - Combination of Cymbalta , gabapentin , opiates ***?  Seen by pain management? - PLAN: *** Hydrocodone /acetaminophen  10-325 mg tablet every 4 hours as needed sent to pharmacy by Carly [name] on 02/25/2024 - Patient is in process of establishing with new pain management doctor.  *** - PMP reviewed by me on 02/25/2024.  3.  Osteopenia: - DEXA scan on 04/23/2018 shows T score of -1.6. - DEXA scan 05/31/2020: T-score -1.7, osteopenia. - Most recent DEXA/bone density (09/11/2023): T-score -1.3, osteopenic but improved - She was started on Prolia  every 6 months in December 2017, most recently on 08/22/2023. - She takes vitamin D  ***. - Most recent labs (02/17/2024): Normal calcium 9.0.  Slightly low vitamin D  29.39. - PLAN: Proceed with Prolia  today.  Prolia  every 6 months. - *** Vitamin D  supplement *** - Recheck vitamin D  at follow-up.  4.  B12 deficiency: - She self administers monthly B12 injections at home *** - PLAN: Continue B12 injections monthly.***  Recheck B12/MMA at follow-up.    5.  Anxiety/sleep problems: - She is taking Xanax  0.5 mg at bedtime.  6.  BRCA1 positivity - S/p bilateral mastectomy in 2013 - S/p risk-reducing TAH/BSO in 2016 - She reports that she has seen genetic counselor in the past (genetic counseling visit with Carly [name] on 05/05/2012) - Patient's sister, mother, and several maternal aunts are BRCA positive - Family history of cancer: Mother had breast cancer at age 45, sister who had breast cancer at age 83, maternal aunt breast cancer at age 71, father died of metastatic prostate cancer.  Paternal grandmother had metastatic ovarian cancer.   - We have discussed some of the general implications of BRCA1 positivity, including the following: Cancers associated with BRCA1 in females are breast cancer, ovarian cancer, pancreatic cancer, and possibly uterine cancer (based on limited data). She has completed recommended risk reducing surgeries (bilateral mastectomy and TAH/BSO).  Although her risk is low, she does still have risk of recurrent locoregional breast cancer, distant recurrence of breast cancer, and risk of new pancreatic cancer (3 to 5% risk) slightly higher than the general population. **NCCN guidelines currently only recommend imaging to screen for pancreatic cancer in patients who have BRCA1 positivity PLUS family history of pancreatic cancer. ***REFER TO [facility] IF PATIENT MEETS CRITERIA*** Recommendation for first-degree relatives over age 12 to be tested for BRCA1 mutation  - Patient educated to avoid hormonal supplements and medications  PLAN SUMMARY: >> PORT FLUSH every 3 months >> Prolia  every 6 months (given today) >> Labs in 6 months = CBC/D, CMP, CA 15-3, vitamin D , B12, MMA >> OFFICE visit in 6 months (1 week after labs) >> MRI breast due 09/08/2024***    REVIEW OF SYSTEMS: ***  Review of Systems - Oncology  PHYSICAL EXAM:   Performance status (ECOG): {CHL ONC ED:[id number]} *** There were no vitals filed for this visit. Wt Readings from Last 3 Encounters:  11/04/23  260 lb (117.9 kg)  08/26/23 271 lb 2.7 oz (123 kg)  02/07/23 280 lb 1.6 oz (127.1 kg)   Physical Exam   PAST MEDICAL/SURGICAL HISTORY:  Past Medical History:  Diagnosis Date   Anxiety    Breast cancer (HCC) 06/26/2011   BRCA1 positive   Neuropathy    Osteopenia    Past Surgical History:  Procedure Laterality Date   ABDOMINAL HYSTERECTOMY     BREAST BIOPSY Left 08/27/2022   US  LT BREAST BX W LOC DEV 1ST LESION IMG BX SPEC US  GUIDE 08/27/2022 [facility] MAMMOGRAPHY   BREAST SURGERY     CESAREAN SECTION     MASTECTOMY       SOCIAL HISTORY:  Social History   Socioeconomic History   Marital status: Single    Spouse name: Not on file   Number of children: Not on file   Years of education: Not on file   Highest education level: Not on file  Occupational History   Not on file  Tobacco Use   Smoking status: Never   Smokeless tobacco: Never  Substance and Sexual Activity   Alcohol use: Yes    Comment: occ   Drug use: No   Sexual activity: Not on file  Other Topics Concern   Not on file  Social History Narrative   Not on file   Social Drivers of Health   Financial Resource Strain: Low Risk  (11/02/2022)   [facility]   Overall Financial Resource Strain (CARDIA)    Difficulty of Paying Living Expenses: Not very hard  Food Insecurity: No Food Insecurity (11/02/2022)   Received from [facility]   Hunger Vital Sign    Within the past 12 months, you worried that your food would run out before you got the money to buy more.: Never true    Within the past 12 months, the food you bought just didn't last and you didn't have money to get more.: Never true  Transportation Needs: No Transportation Needs (08/21/2022)   PRAPARE - Transportation    Lack of Transportation (Medical): No    Lack of Transportation (Non-Medical): No  Physical Activity: Not on file  Stress: Not on file  Social Connections: Not on file  Intimate Partner Violence: Not At Risk (08/30/2022)   Received from [facility]   Humiliation, Afraid, Rape, and Kick questionnaire    Within the last year, have you been afraid of your partner or ex-partner?: No    Within the last year, have you been humiliated or emotionally abused in other ways by your partner or ex-partner?: No    Within the last year, have you been kicked, hit, slapped, or otherwise physically hurt by your partner or ex-partner?: No    Within the last year, have you been raped or forced to have any kind of sexual activity by your partner or ex-partner?:  No    FAMILY HISTORY:  Family History  Problem Relation Age of Onset   Breast cancer Mother 38   BRCA 1/2 Mother    Prostate cancer Father        metastatic   Breast cancer Sister 58       BRCA1 mutation   Breast cancer Maternal Aunt        dx in her 85s; BRCA1 mutation   Heart attack Maternal Grandfather    Ovarian cancer Paternal Grandmother        dx in her 64s   Heart attack Paternal  Grandfather    Healthy Sister    BRCA 1/2 Maternal Aunt    Healthy Maternal Aunt        negative for a BRCA mutation   Healthy Maternal Aunt        negative for a BRCA mutation    CURRENT MEDICATIONS:  Current Outpatient Medications  Medication Sig Dispense Refill   ALPRAZolam  (XANAX ) 0.5 MG tablet TAKE 1 TABLET BY MOUTH AT BEDTIME AS NEEDED FOR ANXIETY 30 tablet 5   anastrozole  (ARIMIDEX ) 1 MG tablet Take 1 tablet (1 mg total) by mouth daily. 30 tablet 6   ascorbic acid (VITAMIN C) 100 MG tablet Take 100 mg by mouth daily.     Calcium-Magnesium -Vitamin D  (CALCIUM 500 PO) Take 1 tablet by mouth daily.     cyanocobalamin  (VITAMIN B12) 1000 MCG/ML injection INJECT 1 ML IN THE MUSCLE EVERY 30 DAYS 3 mL 6   denosumab  (PROLIA ) 60 MG/ML SOSY injection Inject 60 mg into the skin every 6 (six) months.     docusate sodium  (COLACE) 100 MG capsule Take 100 mg by mouth daily as needed for mild constipation.     DULoxetine  (CYMBALTA ) 30 MG capsule Take 30 mg by mouth daily.   1   DULoxetine  (CYMBALTA ) 60 MG capsule Take 1 capsule (60 mg total) by mouth daily. 90 capsule 3   gabapentin  (NEURONTIN ) 600 MG tablet Take 600 mg by mouth at bedtime.  1   HYDROcodone -acetaminophen  (NORCO) 10-325 MG tablet Take 1 tablet by mouth every 4 (four) hours as needed. 84 tablet 0   Misc. Devices MISC Please provide patient with a compression therapy pump fot her Right arm lymphedema. 1 each 0   Multiple Vitamins-Minerals (MULTIVITAMIN ADULT PO) Take 1 tablet by mouth daily.     potassium chloride  SA (KLOR-CON  M) 20 MEQ  tablet Take 2 tablets (40 mEq total) by mouth daily for 3 days. 6 tablet 0   valACYclovir (VALTREX) 1000 MG tablet Take 2,000 mg by mouth 2 (two) times daily as needed (cold sores).     No current facility-administered medications for this visit.    ALLERGIES:  Allergies  Allergen Reactions   Latex Rash   Other Rash    Oozing blistery rash   Sumatriptan Other (See Comments)    Other reaction(s): Unknown Numbness of face and arms   Tape Rash    Oozing blistery rash    LABORATORY DATA:  I have reviewed the labs as listed.     Latest Ref Rng & Units 02/17/2024    3:15 PM 08/21/2023    2:39 PM 02/05/2023   12:07 PM  CBC  WBC 4.0 - 10.5 K/uL 5.6  3.7  4.6   Hemoglobin 12.0 - 15.0 g/dL 88.1  88.2  88.5   Hematocrit 36.0 - 46.0 % 34.4  34.4  33.5   Platelets 150 - 400 K/uL 175  146  163       Latest Ref Rng & Units 02/17/2024    3:15 PM 08/21/2023    2:39 PM 02/05/2023   12:07 PM  CMP  Glucose 70 - 99 mg/dL 98  892  898   BUN 6 - 20 mg/dL 12  12  14    Creatinine 0.44 - 1.00 mg/dL 9.39  9.29  9.37   Sodium 135 - 145 mmol/L 139  137  135   Potassium 3.5 - 5.1 mmol/L 3.7  3.1  3.6   Chloride 98 - 111 mmol/L 104  102  101  CO2 22 - 32 mmol/L 24  25  27    Calcium 8.9 - 10.3 mg/dL 9.0  8.7  9.2   Total Protein 6.5 - 8.1 g/dL 6.9  6.8  6.8   Total Bilirubin 0.0 - 1.2 mg/dL 0.9  1.1  0.5   Alkaline Phos 38 - 126 U/L 47  37  42   AST 15 - 41 U/L 16  22  15    ALT 0 - 44 U/L 15  18  14      DIAGNOSTIC IMAGING:  I have independently reviewed the scans and discussed with the patient. No results found.   WRAP UP:  All questions were answered. The patient knows to call the clinic with any problems, questions or concerns.  Medical decision making: ***  Time spent on visit: I spent {CHL ONC TIME VISIT - DTPQU:[id number]} counseling the patient face to face. The total time spent in the appointment was {CHL ONC TIME VISIT - DTPQU:[id number]} and more than 50% was on  counseling.  Pleasant CHRISTELLA Barefoot, PA-C  ***

## 2024-02-26 ENCOUNTER — Inpatient Hospital Stay

## 2024-02-26 ENCOUNTER — Inpatient Hospital Stay: Attending: Hematology | Admitting: Physician Assistant

## 2024-02-26 NOTE — Progress Notes (Deleted)
 [facility] [address], [place] [address]   CLINIC:  Medical Oncology/Hematology  PCP:  Carly Yancey LABOR, MD [address] / [place] [address] [phone]   REASON FOR VISIT:  Follow-up for recurrent right-sided breast cancer stage IIIb, BRCA1 positive  PRIOR THERAPY: Adjuvant chemotherapy with dose dense AC followed by Taxol from 08/15/2015 through 11/22/2015, XRT completed on 01/27/2016.  CURRENT THERAPY: Anastrozole   BRIEF ONCOLOGIC HISTORY:   Oncology History  Malignant neoplasm of female breast (HCC)  05/06/2015 Initial Biopsy   Incisional biopsy with Dr. Jeoffrey of palpable mass, lower outer quadrant of reconstructed R breast close to nipple ER+ 97%, PR+ 70 (weak) HER 2 - by IHC   07/06/2015 Surgery   Definitive lumpectomy and LN dissection performed revealing a 3.1 cm primary 1/14 positive LN with a macro metastases, satellite nodules adjacent to the primary, LVI-, however she had skin invasion along with satellite nodules, T4b N1a, Stage IIIB, grade IIII,   07/07/2015 Imaging   Per records negative CT C/A/P and bone scan   08/15/2015 - 11/22/2015 Chemotherapy   AC x 4, Taxol X 4 both given dose dense    - 01/27/2016 Radiation Therapy   Dr. Arlie. [name]   02/06/2016 Imaging   Bone density- BMD as determined from [facility] L1-L4 is 1.021 g/cm2 with a T-Score of -1.3. This patient is considered osteopenic according to World Health Organization [facility]) criteria.      CANCER STAGING: Cancer Staging  No matching staging information was found for the patient.   INTERVAL HISTORY:   [name], a 43 y.o. female, returns for routine follow-up of her BRCA1 positive right-sided breast cancer. Carly [name] was last seen on 08/26/2023 by Carly [name].   At today's visit, she  reports feeling ***.   She denies any recent hospitalizations, surgeries, or changes in her  baseline health status. She reports ***% energy and ***% appetite.  She is  maintaining stable weight at this time.  She has not noticed any new breast lumps or axillary lymphadenopathy.  *** ***She denies any new onset bone pain, chest pain, dyspnea, or abdominal pain. ***She has no new headaches, seizures, or focal neurologic deficits. ***No B symptoms such as fever, chills, night sweats, unintentional weight loss.  She continues to take *** ARIMIDEX :  Hot flashes, mood swings, weight gain, alopecia, and musculoskeletal pain  She is receiving Prolia  injections every 6 months.  *** She denies any new onset bone pain, jaw pain, or fracture.  *** She takes vitamin D  ***.  *** Chemotherapy-induced neuropathy (hydrocodone /acetaminophen  10-3 25 every 4 hours)***duloxetine ?  *** Gabapentin ?  *** *** Xanax ?  *** *** Vitamin B12 injections at home?  ***  ASSESSMENT & PLAN:  1.  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  Surgeries were done in [facility]. - Adjuvant chemotherapy with dose dense AC followed by Taxol from 08/15/2015 through 11/22/2015, XRT completed on 01/27/2016. - Femara  started in August 2017, switched to anastrozole  around August 2018 for joint pains which have slightly improved. ***Goal of treatment with aromatase inhibitor is 10 years (through August 2027). - TAH and BSO in 2016 prophylactically for her BRCA1 positivity. - Bone scan on 12/16/2018 did not show any evidence of metastatic disease. - She is tolerating  anastrozole  reasonably well.  ***She reports achy pains in the bones, predominantly in the legs and occasionally in the arms since she started taking aromatase inhibitors.*** - MRI breast (09/09/2023): BI-RADS Category 1, negative. - Physical exam (***): *** - Most recent labs (02/17/2024): CBC grossly normal.  Normal  CMP.  Normal CA 15-3. - PLAN: Continue daily anastrozole . - Continue high risk screening protocol with annual MRI breast (due 09/08/2024), as indicated for patient with BRCA1 gene mutation, right breast DCIS in 2013 with bilateral mastectomies, and recurrent right-sided breast cancer in 2017  2.  Chemotherapy-induced neuropathy - Combination of Cymbalta , gabapentin , opiates ***?  Seen by pain management? - PLAN: *** Hydrocodone /acetaminophen  10-325 mg tablet every 4 hours as needed sent to pharmacy by Dr. Davonna on 02/25/2024 - Patient is in process of establishing with new pain management doctor.  *** - PMP reviewed by me on 02/25/2024.  3.  Osteopenia: - DEXA scan on 04/23/2018 shows T score of -1.6. - DEXA scan 05/31/2020: T-score -1.7, osteopenia. - Most recent DEXA/bone density (09/11/2023): T-score -1.3, osteopenic but improved - She was started on Prolia  every 6 months in December 2017, most recently on 08/22/2023. - She takes vitamin D  ***. - Most recent labs (02/17/2024): Normal calcium 9.0.  Slightly low vitamin D  29.39. - PLAN: Proceed with Prolia  today.  Prolia  every 6 months. - *** Vitamin D  supplement *** - Recheck vitamin D  at follow-up.  4.  B12 deficiency: - She self administers monthly B12 injections at home *** - PLAN: Continue B12 injections monthly.***  Recheck B12/MMA at follow-up.    5.  Anxiety/sleep problems: - She is taking Xanax  0.5 mg at bedtime.  6.  BRCA1 positivity - S/p bilateral mastectomy in 2013 - S/p risk-reducing TAH/BSO in 2016 - She reports that she has seen genetic counselor in the past (genetic counseling visit with Carly [name] on 05/05/2012) - Patient's sister, mother, and several maternal aunts are BRCA positive - Family history of cancer: Mother had breast cancer at age 45, sister who had breast cancer at age 83, maternal aunt breast cancer at age 71, father died of metastatic prostate cancer.  Paternal grandmother had metastatic ovarian cancer.   - We have discussed some of the general implications of BRCA1 positivity, including the following: Cancers associated with BRCA1 in females are breast cancer, ovarian cancer, pancreatic cancer, and possibly uterine cancer (based on limited data). She has completed recommended risk reducing surgeries (bilateral mastectomy and TAH/BSO).  Although her risk is low, she does still have risk of recurrent locoregional breast cancer, distant recurrence of breast cancer, and risk of new pancreatic cancer (3 to 5% risk) slightly higher than the general population. **NCCN guidelines currently only recommend imaging to screen for pancreatic cancer in patients who have BRCA1 positivity PLUS family history of pancreatic cancer. ***REFER TO [facility]IR[facility] IF PATIENT MEETS CRITERIA*** Recommendation for first-degree relatives over age 12 to be tested for BRCA1 mutation  - Patient educated to avoid hormonal supplements and medications  PLAN SUMMARY: >> PORT FLUSH every 3 months >> Prolia  every 6 months (given today) >> Labs in 6 months = CBC/D, CMP, CA 15-3, vitamin D , B12, MMA >> OFFICE visit in 6 months (1 week after labs) >> MRI breast due 09/08/2024***    REVIEW OF SYSTEMS: ***  Review of Systems - Oncology  PHYSICAL EXAM:   Performance status (ECOG): {CHL ONC ED:[id number]} *** There were no vitals filed for this visit. Wt Readings from Last 3 Encounters:  11/04/23  260 lb (117.9 kg)  08/26/23 271 lb 2.7 oz (123 kg)  02/07/23 280 lb 1.6 oz (127.1 kg)   Physical Exam   PAST MEDICAL/SURGICAL HISTORY:  Past Medical History:  Diagnosis Date   Anxiety    Breast cancer (HCC) 06/26/2011   BRCA1 positive   Neuropathy    Osteopenia    Past Surgical History:  Procedure Laterality Date   ABDOMINAL HYSTERECTOMY     BREAST BIOPSY Left 08/27/2022   US  LT BREAST BX W LOC DEV 1ST LESION IMG BX SPEC US  GUIDE 08/27/2022 [facility] MAMMOGRAPHY   BREAST SURGERY     CESAREAN SECTION     MASTECTOMY       SOCIAL HISTORY:  Social History   Socioeconomic History   Marital status: Single    Spouse name: Not on file   Number of children: Not on file   Years of education: Not on file   Highest education level: Not on file  Occupational History   Not on file  Tobacco Use   Smoking status: Never   Smokeless tobacco: Never  Substance and Sexual Activity   Alcohol use: Yes    Comment: occ   Drug use: No   Sexual activity: Not on file  Other Topics Concern   Not on file  Social History Narrative   Not on file   Social Drivers of Health   Financial Resource Strain: Low Risk  (11/02/2022)   [facility]   Overall Financial Resource Strain (CARDIA)    Difficulty of Paying Living Expenses: Not very hard  Food Insecurity: No Food Insecurity (11/02/2022)   Received from [facility]   Hunger Vital Sign    Within the past 12 months, you worried that your food would run out before you got the money to buy more.: Never true    Within the past 12 months, the food you bought just didn't last and you didn't have money to get more.: Never true  Transportation Needs: No Transportation Needs (08/21/2022)   PRAPARE - Transportation    Lack of Transportation (Medical): No    Lack of Transportation (Non-Medical): No  Physical Activity: Not on file  Stress: Not on file  Social Connections: Not on file  Intimate Partner Violence: Not At Risk (08/30/2022)   Received from [facility]   Humiliation, Afraid, Rape, and Kick questionnaire    Within the last year, have you been afraid of your partner or ex-partner?: No    Within the last year, have you been humiliated or emotionally abused in other ways by your partner or ex-partner?: No    Within the last year, have you been kicked, hit, slapped, or otherwise physically hurt by your partner or ex-partner?: No    Within the last year, have you been raped or forced to have any kind of sexual activity by your partner or ex-partner?:  No    FAMILY HISTORY:  Family History  Problem Relation Age of Onset   Breast cancer Mother 38   BRCA 1/2 Mother    Prostate cancer Father        metastatic   Breast cancer Sister 58       BRCA1 mutation   Breast cancer Maternal Aunt        dx in her 85s; BRCA1 mutation   Heart attack Maternal Grandfather    Ovarian cancer Paternal Grandmother        dx in her 64s   Heart attack Paternal  Grandfather    Healthy Sister    BRCA 1/2 Maternal Aunt    Healthy Maternal Aunt        negative for a BRCA mutation   Healthy Maternal Aunt        negative for a BRCA mutation    CURRENT MEDICATIONS:  Current Outpatient Medications  Medication Sig Dispense Refill   ALPRAZolam  (XANAX ) 0.5 MG tablet TAKE 1 TABLET BY MOUTH AT BEDTIME AS NEEDED FOR ANXIETY 30 tablet 5   anastrozole  (ARIMIDEX ) 1 MG tablet Take 1 tablet (1 mg total) by mouth daily. 30 tablet 6   ascorbic acid (VITAMIN C) 100 MG tablet Take 100 mg by mouth daily.     Calcium-Magnesium -Vitamin D  (CALCIUM 500 PO) Take 1 tablet by mouth daily.     cyanocobalamin  (VITAMIN B12) 1000 MCG/ML injection INJECT 1 ML IN THE MUSCLE EVERY 30 DAYS 3 mL 6   denosumab  (PROLIA ) 60 MG/ML SOSY injection Inject 60 mg into the skin every 6 (six) months.     docusate sodium  (COLACE) 100 MG capsule Take 100 mg by mouth daily as needed for mild constipation.     DULoxetine  (CYMBALTA ) 30 MG capsule Take 30 mg by mouth daily.   1   DULoxetine  (CYMBALTA ) 60 MG capsule Take 1 capsule (60 mg total) by mouth daily. 90 capsule 3   gabapentin  (NEURONTIN ) 600 MG tablet Take 600 mg by mouth at bedtime.  1   HYDROcodone -acetaminophen  (NORCO) 10-325 MG tablet Take 1 tablet by mouth every 4 (four) hours as needed. 84 tablet 0   Misc. Devices MISC Please provide patient with a compression therapy pump fot her Right arm lymphedema. 1 each 0   Multiple Vitamins-Minerals (MULTIVITAMIN ADULT PO) Take 1 tablet by mouth daily.     potassium chloride  SA (KLOR-CON  M) 20 MEQ  tablet Take 2 tablets (40 mEq total) by mouth daily for 3 days. 6 tablet 0   valACYclovir (VALTREX) 1000 MG tablet Take 2,000 mg by mouth 2 (two) times daily as needed (cold sores).     No current facility-administered medications for this visit.    ALLERGIES:  Allergies  Allergen Reactions   Latex Rash   Other Rash    Oozing blistery rash   Sumatriptan Other (See Comments)    Other reaction(s): Unknown Numbness of face and arms   Tape Rash    Oozing blistery rash    LABORATORY DATA:  I have reviewed the labs as listed.     Latest Ref Rng & Units 02/17/2024    3:15 PM 08/21/2023    2:39 PM 02/05/2023   12:07 PM  CBC  WBC 4.0 - 10.5 K/uL 5.6  3.7  4.6   Hemoglobin 12.0 - 15.0 g/dL 88.1  88.2  88.5   Hematocrit 36.0 - 46.0 % 34.4  34.4  33.5   Platelets 150 - 400 K/uL 175  146  163       Latest Ref Rng & Units 02/17/2024    3:15 PM 08/21/2023    2:39 PM 02/05/2023   12:07 PM  CMP  Glucose 70 - 99 mg/dL 98  892  898   BUN 6 - 20 mg/dL 12  12  14    Creatinine 0.44 - 1.00 mg/dL 9.39  9.29  9.37   Sodium 135 - 145 mmol/L 139  137  135   Potassium 3.5 - 5.1 mmol/L 3.7  3.1  3.6   Chloride 98 - 111 mmol/L 104  102  101  CO2 22 - 32 mmol/L 24  25  27    Calcium 8.9 - 10.3 mg/dL 9.0  8.7  9.2   Total Protein 6.5 - 8.1 g/dL 6.9  6.8  6.8   Total Bilirubin 0.0 - 1.2 mg/dL 0.9  1.1  0.5   Alkaline Phos 38 - 126 U/L 47  37  42   AST 15 - 41 U/L 16  22  15    ALT 0 - 44 U/L 15  18  14      DIAGNOSTIC IMAGING:  I have independently reviewed the scans and discussed with the patient. No results found.   WRAP UP:  All questions were answered. The patient knows to call the clinic with any problems, questions or concerns.  Medical decision making: ***  Time spent on visit: I spent {CHL ONC TIME VISIT - DTPQU:[id number]} counseling the patient face to face. The total time spent in the appointment was {CHL ONC TIME VISIT - DTPQU:[id number]} and more than 50% was on  counseling.  Pleasant CHRISTELLA Barefoot, PA-C  ***

## 2024-03-03 ENCOUNTER — Inpatient Hospital Stay: Attending: Hematology

## 2024-03-03 ENCOUNTER — Encounter (HOSPITAL_COMMUNITY): Payer: Self-pay | Admitting: Oncology

## 2024-03-03 ENCOUNTER — Telehealth: Payer: Self-pay | Admitting: *Deleted

## 2024-03-03 ENCOUNTER — Inpatient Hospital Stay (HOSPITAL_BASED_OUTPATIENT_CLINIC_OR_DEPARTMENT_OTHER): Admitting: Oncology

## 2024-03-03 VITALS — BP 112/64 | HR 78 | Temp 97.7°F | Resp 16 | Wt 269.6 lb

## 2024-03-03 DIAGNOSIS — Z79899 Other long term (current) drug therapy: Secondary | ICD-10-CM | POA: Insufficient documentation

## 2024-03-03 DIAGNOSIS — Z9221 Personal history of antineoplastic chemotherapy: Secondary | ICD-10-CM | POA: Diagnosis not present

## 2024-03-03 DIAGNOSIS — Z803 Family history of malignant neoplasm of breast: Secondary | ICD-10-CM | POA: Diagnosis not present

## 2024-03-03 DIAGNOSIS — Z8042 Family history of malignant neoplasm of prostate: Secondary | ICD-10-CM | POA: Insufficient documentation

## 2024-03-03 DIAGNOSIS — C50012 Malignant neoplasm of nipple and areola, left female breast: Secondary | ICD-10-CM | POA: Diagnosis not present

## 2024-03-03 DIAGNOSIS — Z79811 Long term (current) use of aromatase inhibitors: Secondary | ICD-10-CM | POA: Insufficient documentation

## 2024-03-03 DIAGNOSIS — Z923 Personal history of irradiation: Secondary | ICD-10-CM | POA: Insufficient documentation

## 2024-03-03 DIAGNOSIS — M8580 Other specified disorders of bone density and structure, unspecified site: Secondary | ICD-10-CM | POA: Diagnosis not present

## 2024-03-03 DIAGNOSIS — C50011 Malignant neoplasm of nipple and areola, right female breast: Secondary | ICD-10-CM

## 2024-03-03 DIAGNOSIS — M8589 Other specified disorders of bone density and structure, multiple sites: Secondary | ICD-10-CM | POA: Diagnosis not present

## 2024-03-03 DIAGNOSIS — D649 Anemia, unspecified: Secondary | ICD-10-CM | POA: Insufficient documentation

## 2024-03-03 DIAGNOSIS — Z170 Estrogen receptor positive status [ER+]: Secondary | ICD-10-CM | POA: Insufficient documentation

## 2024-03-03 DIAGNOSIS — R5383 Other fatigue: Secondary | ICD-10-CM | POA: Diagnosis not present

## 2024-03-03 DIAGNOSIS — G894 Chronic pain syndrome: Secondary | ICD-10-CM | POA: Diagnosis not present

## 2024-03-03 DIAGNOSIS — I890 Lymphedema, not elsewhere classified: Secondary | ICD-10-CM | POA: Insufficient documentation

## 2024-03-03 MED ORDER — DENOSUMAB 60 MG/ML ~~LOC~~ SOSY
60.0000 mg | PREFILLED_SYRINGE | Freq: Once | SUBCUTANEOUS | Status: DC
  Filled 2024-03-03: qty 1

## 2024-03-03 NOTE — Progress Notes (Signed)
 [facility] OFFICE PROGRESS NOTE  Orpha Yancey LABOR, MD  ASSESSMENT & PLAN:    Assessment & Plan Osteopenia of multiple sites - Vitamin D  level is 29.39. -She is currently taking calcium and vitamin D  supplements.  Recommend increasing vitamin D  to 2 tablets daily. - She received Prolia  on 08/22/2023.  Calcium is 9.0.  She is due for Prolia  today.  Unfortunately, she had a family emergency and was unable to stay for her injection.  Will get her rescheduled. - Most recent bone density is from 09/11/2023 which showed a T-score of -1.3. -Continue vitamin D /calcium and Prolia . Chronic pain syndrome Etiology felt to be secondary to prior chemotherapy and current aromatase inhibitor use. Reports pain in legs and arms described as flulike bone pain exacerbated by cold and rainy weather. 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 She was just evaluated by the pain clinic with [facility]. Unfortunately, previous prescriber became out of network and she was unable to afford co-pays. [facility] was unable to take over her prescriptions. She is currently on gabapentin , Cymbalta , Xanax  and Norco. Has devoted health insurance which is not excepted by many facilities. Reports open enrollment  next month and she will switch to a larger health insurance group. She is wondering if we are able to help bridge her through the new year versus trying to establish care with a another pain clinic given she has already seen 2 with no success. A new Rx was just filled by Dr. Kadala # 84 tabs 10-325 Norco every 4 hours as needed for pain. Discussed potentially referring to palliative medicine with [facility] here locally who may be able to manage her pain short-term until her insurance switches over the new year.  Bilateral malignant neoplasm involving both nipple and areola in female, unspecified estrogen receptor status (HCC) - She is tolerating anastrozole  reasonably well. - She reports achy pains in  the bones, predominantly in the legs and occasionally in the arms since she started taking aromatase inhibitors. - Physical exam: No palpable adenopathy. - Labs from 02/17/2024: LFTs normal.  Mild anemia hemoglobin 11.8.  Vitamin D  level 29.39.  Differential is normal.  CA 15-3 was normal. - Will schedule yearly MRI of the breasts.  RTC 6 months for follow-up. -Next MRI is due in 6 months.  Will get that ordered and scheduled today.   Orders Placed This Encounter  Procedures   MR BREAST BILATERAL W WO CONTRAST INC CAD    Standing Status:   Future    Expected Date:   09/05/2024    Expiration Date:   03/03/2025    If indicated for the ordered procedure, I authorize the administration of contrast media per Radiology protocol:   Yes    What is the patient's sedation requirement?:   No Sedation    Does the patient have a pacemaker or implanted devices?:   No    Preferred imaging location?:   [facility] (table limit - 500lbs)   CBC with Differential    Standing Status:   Future    Expected Date:   08/31/2024    Expiration Date:   11/29/2024   Comprehensive metabolic panel    Standing Status:   Future    Expected Date:   08/31/2024    Expiration Date:   11/29/2024   Cancer antigen 15-3    Standing Status:   Future    Expected Date:   08/31/2024    Expiration Date:   11/29/2024  Vitamin D  25 hydroxy    Standing Status:   Future    Expected Date:   08/31/2024    Expiration Date:   03/03/2025    INTERVAL HISTORY: Patient returns for history of right invasive breast cancer and osteopenia.  She is currently on anastrozole  and tolerating well.  She had a bone density on 09/11/2023 which showed a T-score of -1.3.  She is considered to be osteopenic.  She continues calcium and vitamin D .  She also receives Prolia  every 6 months.  She is due for 1 today.  In the interim, patient met with pain and spine clinic with [facility] for bilateral arm and leg pain related to chemotherapy.  Reports she still has  pain despite medication including gabapentin , Cymbalta  and hydrocodone .  She occasionally will take Xanax .  Reports she has been seen by 2 separate pain clinics and neither were able to help her.  Reports one of them was not accepting new patient and the other said they could not help her.  She was seen by [facility] medical here locally but it was too expensive for her to go there monthly.  She has devoted health insurance and will be switching to something more well-known to try to establish with a pain clinic in January.  Reports open enrollment is next month.  She continues to do monthly breast exams.  She has right arm lymphedema which is chronic and stable.  Denies any new lumps or bumps.  She continues the anastrozole .  Reports chronic stable fatigue.  We reviewed CMP, CBC, vitamin D  and CA 15-3.  SUMMARY OF HEMATOLOGIC HISTORY: Oncology History  Malignant neoplasm of female breast (HCC)  05/06/2015 Initial Biopsy   Incisional biopsy with Dr. Jeoffrey of palpable mass, lower outer quadrant of reconstructed R breast close to nipple ER+ 97%, PR+ 70 (weak) HER 2 - by IHC   07/06/2015 Surgery   Definitive lumpectomy and LN dissection performed revealing a 3.1 cm primary 1/14 positive LN with a macro metastases, satellite nodules adjacent to the primary, LVI-, however she had skin invasion along with satellite nodules, T4b N1a, Stage IIIB, grade IIII,   07/07/2015 Imaging   Per records negative CT C/A/P and bone scan   08/15/2015 - 11/22/2015 Chemotherapy   AC x 4, Taxol X 4 both given dose dense    - 01/27/2016 Radiation Therapy   Dr. Arlie. Keenan   02/06/2016 Imaging   Bone density- BMD as determined from [facility] L1-L4 is 1.021 g/cm2 with a T-Score of -1.3. This patient is considered osteopenic according to World Health Organization [facility]) criteria.     1.  BRCA positive right breast infiltrating ductal carcinoma, stage IIIb (T4BN1A), ER/PR positive, HER-2 negative: - Status post bilateral  mastectomies in 2013 for right breast DCIS with implants.  She had cancer of the right breast in 2017 and had lymph node dissection.  Surgeries were done in [place]. - Adjuvant chemotherapy with dose dense AC followed by Taxol from 08/15/2015 through 11/22/2015, XRT completed on 01/27/2016. -Femara  started in August 2017, switched to anastrozole  around August 2018 for joint pains which have slightly improved. - TAH and BSO in 2016 prophylactically for her BRCA1 positivity. - Bone scan on 12/16/2018 did not show any evidence of metastatic disease.   2.  Family history: -Mother had breast cancer at age 35, sister who had breast cancer at age 26, maternal aunt breast cancer at age 32, father died of metastatic prostate cancer.  Paternal grandmother had metastatic  cervical cancer.   3.  Osteopenia: -DEXA scan on 04/23/2018 shows T score of -1.6. -She was started on Prolia  every 6 months in December 2017. - DEXA scan 05/31/2020: T-score -1.7, osteopenia.  Compared to 04/23/2018, slight decrease in density.  CBC    Component Value Date/Time   WBC 5.6 02/17/2024 1515   RBC 3.83 (L) 02/17/2024 1515   HGB 11.8 (L) 02/17/2024 1515   HCT 34.4 (L) 02/17/2024 1515   PLT 175 02/17/2024 1515   MCV 89.8 02/17/2024 1515   MCH 30.8 02/17/2024 1515   MCHC 34.3 02/17/2024 1515   RDW 13.3 02/17/2024 1515   LYMPHSABS 1.4 02/17/2024 1515   MONOABS 0.5 02/17/2024 1515   EOSABS 0.1 02/17/2024 1515   BASOSABS 0.0 02/17/2024 1515       Latest Ref Rng & Units 02/17/2024    3:15 PM 08/21/2023    2:39 PM 02/05/2023   12:07 PM  CMP  Glucose 70 - 99 mg/dL 98  892  898   BUN 6 - 20 mg/dL 12  12  14    Creatinine 0.44 - 1.00 mg/dL 9.39  9.29  9.37   Sodium 135 - 145 mmol/L 139  137  135   Potassium 3.5 - 5.1 mmol/L 3.7  3.1  3.6   Chloride 98 - 111 mmol/L 104  102  101   CO2 22 - 32 mmol/L 24  25  27    Calcium 8.9 - 10.3 mg/dL 9.0  8.7  9.2   Total Protein 6.5 - 8.1 g/dL 6.9  6.8  6.8   Total Bilirubin 0.0 -  1.2 mg/dL 0.9  1.1  0.5   Alkaline Phos 38 - 126 U/L 47  37  42   AST 15 - 41 U/L 16  22  15    ALT 0 - 44 U/L 15  18  14       Lab Results  Component Value Date   FERRITIN 68 07/24/2021   VITAMINB12 254 07/30/2022    Vitals:   03/03/24 1003  BP: 112/64  Pulse: 78  Resp: 16  Temp: 97.7 F (36.5 C)  SpO2: 100%    Review of System:  Review of Systems  Constitutional:  Positive for malaise/fatigue. Negative for weight loss.  Musculoskeletal:  Positive for joint pain and myalgias.  Neurological:  Positive for tingling, sensory change, speech change and headaches.  Psychiatric/Behavioral:  The patient has insomnia.     Physical Exam: Physical Exam Constitutional:      Appearance: Normal appearance.  HENT:     Head: Normocephalic and atraumatic.  Eyes:     Pupils: Pupils are equal, round, and reactive to light.  Cardiovascular:     Rate and Rhythm: Normal rate and regular rhythm.     Heart sounds: Normal heart sounds. No murmur heard. Pulmonary:     Effort: Pulmonary effort is normal.     Breath sounds: Normal breath sounds. No wheezing.  Chest:  Breasts:    Right: No tenderness.     Left: No tenderness.     Comments: Bilateral implants without evidence of axillary or cervical lymphadenopathy.  She does have right arm lymphedema this is chronic and stable. Abdominal:     General: Bowel sounds are normal. There is no distension.     Palpations: Abdomen is soft.     Tenderness: There is no abdominal tenderness.  Musculoskeletal:        General: Normal range of motion.     Cervical back: Normal  range of motion.  Skin:    General: Skin is warm and dry.     Findings: No rash.  Neurological:     Mental Status: She is alert and oriented to person, place, and time.     Gait: Gait is intact.  Psychiatric:        Mood and Affect: Mood and affect normal.        Cognition and Memory: Memory normal.        Judgment: Judgment normal.      I spent 25 minutes dedicated  to the care of this patient (face-to-face and non-face-to-face) on the date of the encounter to include what is described in the assessment and plan.,  Delon Hope, NP 03/03/2024 12:34 PM

## 2024-03-03 NOTE — Assessment & Plan Note (Deleted)
 She was just evaluated by the pain clinic with [facility]. Unfortunately, previous prescriber became out of network and she was unable to afford co-pays. Unfortunately, [facility] was unable to take over her prescriptions. She is currently on gabapentin , Cymbalta , Xanax  and Norco.

## 2024-03-03 NOTE — Progress Notes (Signed)
 Patient presents today for Prolia  injection per provider's order. Provider Delon Hope, NP stated patient was ready for injection. Provider made RN aware that patient had to leave suddenly due to a family emergency. Patient did not receive her Prolia  injection at this time.

## 2024-03-03 NOTE — Assessment & Plan Note (Signed)
-   She is tolerating anastrozole  reasonably well. - She reports achy pains in the bones, predominantly in the legs and occasionally in the arms since she started taking aromatase inhibitors. - Physical exam: No palpable adenopathy. - Labs from 02/17/2024: LFTs normal.  Mild anemia hemoglobin 11.8.  Vitamin D  level 29.39.  Differential is normal.  CA 15-3 was normal. - Will schedule yearly MRI of the breasts.  RTC 6 months for follow-up. -Next MRI is due in 6 months.  Will get that ordered and scheduled today.

## 2024-03-03 NOTE — Assessment & Plan Note (Addendum)
-   Vitamin D  level is 29.39. -She is currently taking calcium and vitamin D  supplements.  Recommend increasing vitamin D  to 2 tablets daily. - She received Prolia  on 08/22/2023.  Calcium is 9.0.  She is due for Prolia  today.  Unfortunately, she had a family emergency and was unable to stay for her injection.  Will get her rescheduled. - Most recent bone density is from 09/11/2023 which showed a T-score of -1.3. -Continue vitamin D /calcium and Prolia .

## 2024-03-03 NOTE — Assessment & Plan Note (Addendum)
 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 Reports pain in legs and arms described as flulike bone pain exacerbated by cold and rainy weather. 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 She was just evaluated by the pain clinic with [facility]. Unfortunately, previous prescriber became out of network and she was unable to afford co-pays. [facility] was unable to take over her prescriptions. She is currently on gabapentin , Cymbalta , Xanax  and Norco. Has devoted health insurance which is not excepted by many facilities. Reports open enrollment  next month and she will switch to a larger health insurance group. She is wondering if we are able to help bridge her through the new year versus trying to establish care with a another pain clinic given she has already seen 2 with no success. A new Rx was just filled by Dr. Kadala # 84 tabs 10-325 Norco every 4 hours as needed for pain. Discussed potentially referring to palliative medicine with [facility] here locally who may be able to manage her pain short-term until her insurance switches over the new year.

## 2024-03-03 NOTE — Telephone Encounter (Signed)
 Shona Rigg with [facility] is able to see this patient on September 30 th as an outpatient.  Patient aware to expect a telephone call.  Delon Hope, NP made aware of plan.

## 2024-03-03 NOTE — Assessment & Plan Note (Deleted)
-   She is tolerating anastrozole  reasonably well. - She reports achy pains in the bones, predominantly in the legs and occasionally in the arms since she started taking aromatase inhibitors. - Physical exam: No palpable adenopathy. - Labs from 02/17/2024: LFTs normal.  Mild leukopenia, hypokalemia and thrombocytopenia likely from infection she had prior to labs.  CA 15-3 was normal. - Will schedule yearly MRI of the breasts.  RTC 6 months for follow-up.

## 2024-03-04 ENCOUNTER — Inpatient Hospital Stay

## 2024-03-04 VITALS — BP 135/85 | HR 94 | Temp 96.7°F | Resp 20

## 2024-03-04 DIAGNOSIS — C50011 Malignant neoplasm of nipple and areola, right female breast: Secondary | ICD-10-CM | POA: Diagnosis not present

## 2024-03-04 DIAGNOSIS — M8580 Other specified disorders of bone density and structure, unspecified site: Secondary | ICD-10-CM

## 2024-03-04 DIAGNOSIS — Z79811 Long term (current) use of aromatase inhibitors: Secondary | ICD-10-CM

## 2024-03-04 MED ORDER — DENOSUMAB 60 MG/ML ~~LOC~~ SOSY
60.0000 mg | PREFILLED_SYRINGE | Freq: Once | SUBCUTANEOUS | Status: AC
  Administered 2024-03-04: 60 mg via SUBCUTANEOUS
  Filled 2024-03-04: qty 1

## 2024-03-04 NOTE — Progress Notes (Signed)
 Patient tolerated Prolia injection with no complaints voiced.  Site clean and dry with no bruising or swelling noted at site.  See MAR for details.  Band aid applied.  Patient stable during and after injection.  Vss with discharge and left in satisfactory condition with no s/s of distress noted. All follow ups as scheduled.   Carly [name] [employer]

## 2024-03-04 NOTE — Patient Instructions (Signed)
 Denosumab Injection (Osteoporosis) What is this medication? DENOSUMAB (den oh SUE mab) prevents and treats osteoporosis. It works by Interior and spatial designer stronger and less likely to break (fracture). It is a monoclonal antibody. This medicine may be used for other purposes; ask your health care provider or pharmacist if you have questions. COMMON BRAND NAME(S): Prolia What should I tell my care team before I take this medication? They need to know if you have any of these conditions: Dental or gum disease Had thyroid or parathyroid (glands located in neck) surgery Having dental surgery or a tooth pulled Kidney disease Low levels of calcium in the blood On dialysis Poor nutrition Thyroid disease Trouble absorbing nutrients from your food An unusual or allergic reaction to denosumab, other medications, foods, dyes, or preservatives Pregnant or trying to get pregnant Breastfeeding How should I use this medication? This medication is injected under the skin. It is given by your care team in a hospital or clinic setting. A special MedGuide will be given to you before each treatment. Be sure to read this information carefully each time. Talk to your care team about the use of this medication in children. Special care may be needed. Overdosage: If you think you have taken too much of this medicine contact a poison control center or emergency room at once. NOTE: This medicine is only for you. Do not share this medicine with others. What if I miss a dose? Keep appointments for follow-up doses. It is important not to miss your dose. Call your care team if you are unable to keep an appointment. What may interact with this medication? Do not take this medication with any of the following: Other medications that contain denosumab This medication may also interact with the following: Medications that lower your chance of fighting infection Steroid medications, such as prednisone or cortisone This  list may not describe all possible interactions. Give your health care provider a list of all the medicines, herbs, non-prescription drugs, or dietary supplements you use. Also tell them if you smoke, drink alcohol, or use illegal drugs. Some items may interact with your medicine. What should I watch for while using this medication? Your condition will be monitored carefully while you are receiving this medication. You may need blood work done while taking this medication. This medication may increase your risk of getting an infection. Call your care team for advice if you get a fever, chills, sore throat, or other symptoms of a cold or flu. Do not treat yourself. Try to avoid being around people who are sick. Tell your dentist and dental surgeon that you are taking this medication. You should not have major dental surgery while on this medication. See your dentist to have a dental exam and fix any dental problems before starting this medication. Take good care of your teeth while on this medication. Make sure you see your dentist for regular follow-up appointments. This medication may cause low levels of calcium in your body. The risk of severe side effects is increased in people with kidney disease. Your care team may prescribe calcium and vitamin D to help prevent low calcium levels while you take this medication. It is important to take calcium and vitamin D as directed by your care team. Talk to your care team if you may be pregnant. Serious birth defects may occur if you take this medication during pregnancy and for 5 months after the last dose. You will need a negative pregnancy test before starting this medication. Contraception  is recommended while taking this medication and for 5 months after the last dose. Your care team can help you find the option that works for you. Talk to your care team before breastfeeding. Changes to your treatment plan may be needed. What side effects may I notice from  receiving this medication? Side effects that you should report to your care team as soon as possible: Allergic reactions--skin rash, itching, hives, swelling of the face, lips, tongue, or throat Infection--fever, chills, cough, sore throat, wounds that don't heal, pain or trouble when passing urine, general feeling of discomfort or being unwell Low calcium level--muscle pain or cramps, confusion, tingling, or numbness in the hands or feet Osteonecrosis of the jaw--pain, swelling, or redness in the mouth, numbness of the jaw, poor healing after dental work, unusual discharge from the mouth, visible bones in the mouth Severe bone, joint, or muscle pain Skin infection--skin redness, swelling, warmth, or pain Side effects that usually do not require medical attention (report these to your care team if they continue or are bothersome): Back pain Headache Joint pain Muscle pain Pain in the hands, arms, legs, or feet Runny or stuffy nose Sore throat This list may not describe all possible side effects. Call your doctor for medical advice about side effects. You may report side effects to FDA at 1-800-FDA-1088. Where should I keep my medication? This medication is given in a hospital or clinic. It will not be stored at home. NOTE: This sheet is a summary. It may not cover all possible information. If you have questions about this medicine, talk to your doctor, pharmacist, or health care provider.  2024 [employer]vier/Gold Standard (2022-07-17 00:00:00)

## 2024-03-16 ENCOUNTER — Other Ambulatory Visit: Payer: Self-pay | Admitting: *Deleted

## 2024-03-16 DIAGNOSIS — G620 Drug-induced polyneuropathy: Secondary | ICD-10-CM

## 2024-03-16 MED ORDER — HYDROCODONE-ACETAMINOPHEN 10-325 MG PO TABS: 1.0000 | ORAL_TABLET | ORAL | 0 refills | Status: AC | PRN

## 2024-03-24 DIAGNOSIS — Z515 Encounter for palliative care: Secondary | ICD-10-CM | POA: Diagnosis not present

## 2024-03-24 DIAGNOSIS — G629 Polyneuropathy, unspecified: Secondary | ICD-10-CM | POA: Diagnosis not present

## 2024-03-24 DIAGNOSIS — G8929 Other chronic pain: Secondary | ICD-10-CM | POA: Diagnosis not present

## 2024-05-18 ENCOUNTER — Other Ambulatory Visit: Payer: Self-pay | Admitting: *Deleted

## 2024-05-18 DIAGNOSIS — C50911 Malignant neoplasm of unspecified site of right female breast: Secondary | ICD-10-CM

## 2024-05-20 ENCOUNTER — Encounter (HOSPITAL_COMMUNITY): Payer: Self-pay | Admitting: Oncology

## 2024-05-20 MED ORDER — ALPRAZOLAM 0.5 MG PO TABS
0.5000 mg | ORAL_TABLET | Freq: Every evening | ORAL | 1 refills | Status: AC | PRN
Start: 2024-05-20 — End: ?

## 2024-05-26 ENCOUNTER — Encounter (HOSPITAL_COMMUNITY): Payer: Self-pay | Admitting: Oncology

## 2024-06-02 ENCOUNTER — Inpatient Hospital Stay

## 2024-06-08 ENCOUNTER — Inpatient Hospital Stay: Attending: Hematology

## 2024-06-11 ENCOUNTER — Inpatient Hospital Stay: Attending: Hematology

## 2024-06-22 ENCOUNTER — Encounter: Payer: Self-pay | Admitting: *Deleted

## 2024-06-23 ENCOUNTER — Inpatient Hospital Stay: Attending: Oncology

## 2024-06-23 ENCOUNTER — Encounter (HOSPITAL_COMMUNITY): Payer: Self-pay | Admitting: Oncology

## 2024-06-23 DIAGNOSIS — Z452 Encounter for adjustment and management of vascular access device: Secondary | ICD-10-CM | POA: Insufficient documentation

## 2024-06-23 DIAGNOSIS — C50011 Malignant neoplasm of nipple and areola, right female breast: Secondary | ICD-10-CM | POA: Diagnosis present

## 2024-06-23 NOTE — Progress Notes (Signed)
 Patients port flushed without difficulty.  Good blood return noted with no bruising or swelling noted at site.  No labs ordered at this time.  VSS with discharge and left in satisfactory condition with no s/s of distress noted. All follow ups as scheduled.       Tira Lafferty

## 2024-07-16 ENCOUNTER — Other Ambulatory Visit: Payer: Self-pay | Admitting: Oncology

## 2024-07-16 DIAGNOSIS — C50911 Malignant neoplasm of unspecified site of right female breast: Secondary | ICD-10-CM

## 2024-07-31 ENCOUNTER — Encounter (HOSPITAL_COMMUNITY): Payer: Self-pay | Admitting: Oncology

## 2024-09-01 ENCOUNTER — Inpatient Hospital Stay: Attending: Oncology

## 2024-09-08 ENCOUNTER — Inpatient Hospital Stay: Admitting: Oncology

## 2024-09-08 ENCOUNTER — Ambulatory Visit: Admitting: Oncology

## 2024-09-08 ENCOUNTER — Inpatient Hospital Stay
